# Patient Record
Sex: Female | Born: 1960 | Race: White | Hispanic: No | Smoking: Never smoker
Health system: Southern US, Community
[De-identification: ages and names within clinical notes are randomized; demographics above are authoritative.]

## PROBLEM LIST (undated history)

## (undated) DIAGNOSIS — Z8719 Personal history of other diseases of the digestive system: Secondary | ICD-10-CM

## (undated) DIAGNOSIS — C801 Malignant (primary) neoplasm, unspecified: Secondary | ICD-10-CM

## (undated) DIAGNOSIS — G629 Polyneuropathy, unspecified: Secondary | ICD-10-CM

## (undated) DIAGNOSIS — F32A Depression, unspecified: Secondary | ICD-10-CM

## (undated) DIAGNOSIS — M2550 Pain in unspecified joint: Secondary | ICD-10-CM

## (undated) DIAGNOSIS — A64 Unspecified sexually transmitted disease: Secondary | ICD-10-CM

## (undated) DIAGNOSIS — F419 Anxiety disorder, unspecified: Secondary | ICD-10-CM

## (undated) DIAGNOSIS — Z973 Presence of spectacles and contact lenses: Secondary | ICD-10-CM

## (undated) DIAGNOSIS — M7989 Other specified soft tissue disorders: Secondary | ICD-10-CM

## (undated) DIAGNOSIS — G35 Multiple sclerosis: Secondary | ICD-10-CM

## (undated) DIAGNOSIS — H539 Unspecified visual disturbance: Secondary | ICD-10-CM

## (undated) DIAGNOSIS — F329 Major depressive disorder, single episode, unspecified: Secondary | ICD-10-CM

## (undated) DIAGNOSIS — S2239XA Fracture of one rib, unspecified side, initial encounter for closed fracture: Secondary | ICD-10-CM

## (undated) DIAGNOSIS — Z923 Personal history of irradiation: Secondary | ICD-10-CM

## (undated) DIAGNOSIS — M1990 Unspecified osteoarthritis, unspecified site: Secondary | ICD-10-CM

## (undated) DIAGNOSIS — R413 Other amnesia: Secondary | ICD-10-CM

## (undated) DIAGNOSIS — Z87442 Personal history of urinary calculi: Secondary | ICD-10-CM

## (undated) DIAGNOSIS — K760 Fatty (change of) liver, not elsewhere classified: Secondary | ICD-10-CM

## (undated) DIAGNOSIS — D649 Anemia, unspecified: Secondary | ICD-10-CM

## (undated) DIAGNOSIS — M549 Dorsalgia, unspecified: Secondary | ICD-10-CM

## (undated) DIAGNOSIS — B029 Zoster without complications: Secondary | ICD-10-CM

## (undated) DIAGNOSIS — R3915 Urgency of urination: Secondary | ICD-10-CM

## (undated) HISTORY — PX: BUNIONECTOMY: SHX129

## (undated) HISTORY — DX: Depression, unspecified: F32.A

## (undated) HISTORY — DX: Polyneuropathy, unspecified: G62.9

## (undated) HISTORY — DX: Pain in unspecified joint: M25.50

## (undated) HISTORY — DX: Other amnesia: R41.3

## (undated) HISTORY — DX: Zoster without complications: B02.9

## (undated) HISTORY — DX: Other specified soft tissue disorders: M79.89

## (undated) HISTORY — DX: Unspecified visual disturbance: H53.9

## (undated) HISTORY — PX: COLONOSCOPY W/ BIOPSIES AND POLYPECTOMY: SHX1376

## (undated) HISTORY — DX: Anxiety disorder, unspecified: F41.9

## (undated) HISTORY — PX: ADENOIDECTOMY: SUR15

## (undated) HISTORY — DX: Fatty (change of) liver, not elsewhere classified: K76.0

## (undated) HISTORY — DX: Multiple sclerosis: G35

## (undated) HISTORY — DX: Fracture of one rib, unspecified side, initial encounter for closed fracture: S22.39XA

## (undated) HISTORY — PX: TONSILLECTOMY: SUR1361

## (undated) HISTORY — DX: Major depressive disorder, single episode, unspecified: F32.9

## (undated) HISTORY — DX: Dorsalgia, unspecified: M54.9

## (undated) HISTORY — DX: Unspecified sexually transmitted disease: A64

---

## 2001-09-18 ENCOUNTER — Emergency Department (HOSPITAL_COMMUNITY): Admission: EM | Admit: 2001-09-18 | Discharge: 2001-09-18 | Payer: Self-pay | Admitting: *Deleted

## 2003-03-29 ENCOUNTER — Encounter: Admission: RE | Admit: 2003-03-29 | Discharge: 2003-03-29 | Payer: Self-pay | Admitting: Internal Medicine

## 2003-05-09 ENCOUNTER — Encounter: Admission: RE | Admit: 2003-05-09 | Discharge: 2003-05-09 | Payer: Self-pay | Admitting: Internal Medicine

## 2005-08-30 ENCOUNTER — Emergency Department (HOSPITAL_COMMUNITY): Admission: EM | Admit: 2005-08-30 | Discharge: 2005-08-30 | Payer: Self-pay | Admitting: Emergency Medicine

## 2008-03-25 ENCOUNTER — Other Ambulatory Visit: Admission: RE | Admit: 2008-03-25 | Discharge: 2008-03-25 | Payer: Self-pay | Admitting: Obstetrics and Gynecology

## 2009-02-08 HISTORY — PX: BUNIONECTOMY: SHX129

## 2009-11-03 ENCOUNTER — Ambulatory Visit: Payer: Self-pay | Admitting: Internal Medicine

## 2011-12-14 DIAGNOSIS — M1990 Unspecified osteoarthritis, unspecified site: Secondary | ICD-10-CM | POA: Insufficient documentation

## 2012-03-20 DIAGNOSIS — G35 Multiple sclerosis: Secondary | ICD-10-CM | POA: Insufficient documentation

## 2012-09-19 DIAGNOSIS — F32A Depression, unspecified: Secondary | ICD-10-CM | POA: Insufficient documentation

## 2012-09-19 DIAGNOSIS — F329 Major depressive disorder, single episode, unspecified: Secondary | ICD-10-CM | POA: Insufficient documentation

## 2012-10-16 ENCOUNTER — Encounter: Payer: Self-pay | Admitting: Certified Nurse Midwife

## 2012-10-17 ENCOUNTER — Ambulatory Visit (INDEPENDENT_AMBULATORY_CARE_PROVIDER_SITE_OTHER): Payer: BC Managed Care – PPO | Admitting: Certified Nurse Midwife

## 2012-10-17 ENCOUNTER — Encounter: Payer: Self-pay | Admitting: Certified Nurse Midwife

## 2012-10-17 VITALS — BP 130/70 | HR 60 | Resp 16 | Ht 63.75 in | Wt 223.0 lb

## 2012-10-17 DIAGNOSIS — Z862 Personal history of diseases of the blood and blood-forming organs and certain disorders involving the immune mechanism: Secondary | ICD-10-CM

## 2012-10-17 DIAGNOSIS — Z0000 Encounter for general adult medical examination without abnormal findings: Secondary | ICD-10-CM

## 2012-10-17 DIAGNOSIS — R6889 Other general symptoms and signs: Secondary | ICD-10-CM

## 2012-10-17 DIAGNOSIS — A6009 Herpesviral infection of other urogenital tract: Secondary | ICD-10-CM

## 2012-10-17 DIAGNOSIS — Z01419 Encounter for gynecological examination (general) (routine) without abnormal findings: Secondary | ICD-10-CM

## 2012-10-17 DIAGNOSIS — A6000 Herpesviral infection of urogenital system, unspecified: Secondary | ICD-10-CM

## 2012-10-17 LAB — IBC PANEL
%SAT: 6 % — ABNORMAL LOW (ref 20–55)
UIBC: 414 ug/dL — ABNORMAL HIGH (ref 125–400)

## 2012-10-17 LAB — POCT URINALYSIS DIPSTICK
Blood, UA: NEGATIVE
Nitrite, UA: NEGATIVE
Urobilinogen, UA: NEGATIVE

## 2012-10-17 LAB — COMPREHENSIVE METABOLIC PANEL
ALT: 24 U/L (ref 0–35)
AST: 25 U/L (ref 0–37)
BUN: 17 mg/dL (ref 6–23)
Calcium: 9 mg/dL (ref 8.4–10.5)
Chloride: 106 mEq/L (ref 96–112)
Creat: 0.73 mg/dL (ref 0.50–1.10)
Potassium: 4.2 mEq/L (ref 3.5–5.3)
Sodium: 137 mEq/L (ref 135–145)
Total Bilirubin: 0.6 mg/dL (ref 0.3–1.2)
Total Protein: 6.7 g/dL (ref 6.0–8.3)

## 2012-10-17 LAB — LIPID PANEL
Cholesterol: 161 mg/dL (ref 0–200)
LDL Cholesterol: 66 mg/dL (ref 0–99)
Total CHOL/HDL Ratio: 3.4 Ratio
Triglycerides: 236 mg/dL — ABNORMAL HIGH (ref <150)

## 2012-10-17 LAB — CBC
HCT: 31.9 % — ABNORMAL LOW (ref 36.0–46.0)
MCH: 23.1 pg — ABNORMAL LOW (ref 26.0–34.0)

## 2012-10-17 LAB — HEMOGLOBIN, FINGERSTICK: Hemoglobin, fingerstick: 9.8 g/dL — ABNORMAL LOW (ref 12.0–16.0)

## 2012-10-17 LAB — FERRITIN: Ferritin: 4 ng/mL — ABNORMAL LOW (ref 10–291)

## 2012-10-17 MED ORDER — VALACYCLOVIR HCL 1 G PO TABS: 500.0000 mg | ORAL_TABLET | Freq: Every day | ORAL | Status: DC

## 2012-10-17 MED ORDER — FUSION PLUS PO CAPS: 1.0000 | ORAL_CAPSULE | Freq: Every day | ORAL | Status: DC

## 2012-10-17 NOTE — Patient Instructions (Addendum)

## 2012-10-17 NOTE — Progress Notes (Signed)
52 y.o. G53P1001 Divorced Caucasian Fe here for annual exam. Periods regular, no issues. MS follow up now yearly, with Lexapro use now. Fatigue still an issue, but works full time. No STD screening needed. Has follow up soon regarding Lexapro. No partner change. No health issues today.  Has stopped iron po, due to constipation. Patient tries to work iron in diet.  Patient's last menstrual period was 09/24/2012.          Sexually active: yes  The current method of family planning is condoms all the time.   Partner vasectomy. Exercising: no  exercise Smoker:  no  Health Maintenance: Pap:  05-14-11 neg HPV HR neg MMG:  09/16/12 Colonoscopy:  2011 BMD:   none TDaP:  2012 Labs: Poct urine-neg, hgb-9.8 Self breast exam: done monthly   reports that she has never smoked. She does not have any smokeless tobacco history on file. She reports that  drinks alcohol. She reports that she does not use illicit drugs.  Past Medical History  Diagnosis Date  . Multiple sclerosis     age 9  . 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 Kidney stone   . Shingles     Past Surgical History  Procedure Laterality Date  . Cesarean section      Current Outpatient Prescriptions  Medication Sig Dispense Refill  . CALCIUM PO Take by mouth daily.      . Cholecalciferol (VITAMIN D PO) Take by mouth daily.      Marland Kitchen escitalopram (LEXAPRO) 10 MG tablet Take 10 mg by mouth daily.      . IRON PO Take by mouth. occ      . valACYclovir (VALTREX) 1000 MG tablet Take 500 mg by mouth daily.       No current facility-administered medications for this visit.    Family History  Problem Relation Age of Onset  . Osteoporosis Mother   . Heart disease Mother   . Cancer Father     colon  . Heart disease Brother     ROS:  Pertinent items are noted in HPI.  Otherwise, a comprehensive ROS was negative.  Exam:   BP 130/70  Pulse 60  Resp 16  Ht 5' 3.75" (1.619 m)  Wt 223 lb (101.152 kg)  BMI 38.59 kg/m2   LMP 09/24/2012 Height: 5' 3.75" (161.9 cm)  Ht Readings from Last 3 Encounters:  10/17/12 5' 3.75" (1.619 m)    General appearance: alert, cooperative and appears stated age Head: Normocephalic, without obvious abnormality, atraumatic Neck: no adenopathy, supple, symmetrical, trachea midline and thyroid normal to inspection and palpation Lungs: clear to auscultation bilaterally Breasts: normal appearance, no masses or tenderness, No nipple retraction or dimpling, No nipple discharge or bleeding, No axillary or supraclavicular adenopathy Heart: regular rate and rhythm Abdomen: soft, non-tender; no masses,  no organomegaly Extremities: extremities normal, atraumatic, no cyanosis or edema Skin: Skin color, texture, turgor normal. No rashes or lesions Lymph nodes: Cervical, supraclavicular, and axillary nodes normal. No abnormal inguinal nodes palpated Neurologic: Grossly normal   Pelvic: External genitalia:  no lesions              Urethra:  normal appearing urethra with no masses, tenderness or lesions              Bartholin's and Skene's: normal                 Vagina: normal appearing vagina with normal color and  discharge, no lesions              Cervix: normal, non tender              Pap taken: no Bimanual Exam:  Uterus:  normal size, contour, position, consistency, mobility, non-tender and anteverted              Adnexa: normal adnexa and no mass, fullness, tenderness               Rectovaginal: Confirms               Anus:  normal sphincter tone, no lesions  A:  Well Woman with normal exam  Perimenopausal  History of MS diagnosed in 30's, stable, has follow up every year with neurology  History of anemia with low Hgb.today, stopped oral iron due to constipation  P:   Reviewed health and wellness pertinent to exam  Patient has menses calendar with normal and abnormal parameters  Keep follow up as recommended  Discussed importance of maintaining good iron count to help with  fatigue, and decrease risk of infection.  Discussed Rx Fusion Plus which has a probiotic that helps eliminate constipation. Discussed dietary absorption factors and iron food sources. Avoid large amounts of tea, soda. Questions addressed at length.  Patient desires trial  RX Fusion Plus see order  Lab: Ferritin, Iron,TIBC panel, CBC, Lipid panel, CMP, TSH,Hgb A-1c  Pap smear as per guidelines   Mammogram yearly Pap smear not taken today   adequate intake of calcium and vitamin D, diet and exercise, Kegel's exercises discussed  return annually or prn  An After Visit Summary was printed and given to the patient.

## 2012-10-19 ENCOUNTER — Telehealth: Payer: Self-pay

## 2012-10-19 NOTE — Telephone Encounter (Signed)
Message copied by Eliezer Bottom on Thu Oct 19, 2012 10:03 AM ------      Message from: Verner Chol      Created: Wed Oct 18, 2012  9:05 AM       Notify patient that CBC did show low Hgb.      Iron level 26 should be between 42-145      Ferritin level low 4 should be 10 to 291      Iron saturation level low  6 should be 20 to 55%      All  Indicate iron deficiency anemia, Start on Fusion Plus as discussed at bedtime, work on iron foods and good vitamin c intake for absorption      Re check Iron level in one month  (order in)      Notify Hgb A1-c , liver kidney glucose profile, thyroid, normal      Lipid panel normal except elevated triglycerides, work on decreasing fried foods, donuts, fried meats etc. Increase exercise daily recheck in 6 months ( order in) fasting ------

## 2012-10-19 NOTE — Telephone Encounter (Signed)
Patient returning call.

## 2012-10-19 NOTE — Telephone Encounter (Signed)
lmtcb

## 2012-10-19 NOTE — Telephone Encounter (Signed)
Left message for call back.

## 2012-10-19 NOTE — Telephone Encounter (Signed)
Patient calling Joy back again.

## 2012-10-19 NOTE — Telephone Encounter (Signed)
Patient notified

## 2012-10-22 NOTE — Progress Notes (Signed)
Note reviewed, agree with plan.  Aarionna Germer, MD  

## 2013-08-30 ENCOUNTER — Emergency Department (INDEPENDENT_AMBULATORY_CARE_PROVIDER_SITE_OTHER)
Admission: EM | Admit: 2013-08-30 | Discharge: 2013-08-30 | Disposition: A | Discharge status: Home / Self Care | Payer: BC Managed Care – PPO | Source: Home / Self Care | Attending: Family Medicine | Admitting: Family Medicine

## 2013-08-30 ENCOUNTER — Emergency Department (INDEPENDENT_AMBULATORY_CARE_PROVIDER_SITE_OTHER): Payer: BC Managed Care – PPO

## 2013-08-30 ENCOUNTER — Encounter (HOSPITAL_COMMUNITY): Payer: Self-pay | Admitting: Emergency Medicine

## 2013-08-30 DIAGNOSIS — M25561 Pain in right knee: Secondary | ICD-10-CM

## 2013-08-30 DIAGNOSIS — M25569 Pain in unspecified knee: Secondary | ICD-10-CM

## 2013-08-30 MED ORDER — MELOXICAM 7.5 MG PO TABS
7.5000 mg | ORAL_TABLET | Freq: Every day | ORAL | Status: DC
Start: 2013-08-30 — End: 2014-02-27

## 2013-08-30 NOTE — ED Provider Notes (Signed)
CSN: [id number]     Arrival date & time 08/30/13  1541 History   First MD Initiated Contact with Patient 08/30/13 1558     Chief Complaint  Patient presents with  . Knee Pain   (Consider location/radiation/quality/duration/timing/severity/associated sxs/prior Treatment) Patient is a 53 y.o. female presenting with knee pain. The history is provided by the patient.  Knee Pain Location:  Knee Time since incident:  4 months Injury: yes   Mechanism of injury: fall   Mechanism of injury comment:  States she fell on concrete landing on both knees 4 months ago and still has persistent discomfort at right knee Fall:    Fall occurred:  Standing   Entrapped after fall: no   Pain details:    Quality:  Aching Chronicity:  Chronic Dislocation: no   Prior injury to area:  No Ineffective treatments:  None tried   Past Medical History  Diagnosis Date  . Multiple sclerosis     age 89  . 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 Kidney stone   . Shingles    Past Surgical History  Procedure Laterality Date  . Cesarean section     Family History  Problem Relation Age of Onset  . Osteoporosis Mother   . Heart disease Mother   . Cancer Father     colon  . Heart disease Brother    History  Substance Use Topics  . Smoking status: Never Smoker   . Smokeless tobacco: Not on file  . Alcohol Use: Yes     Comment: rare   OB History   Grav Para Term Preterm Abortions TAB SAB Ect Mult Living   1 1 1       1      Review of Systems  Allergies  Review of patient's allergies indicates no known allergies.  Home Medications   Prior to Admission medications   Medication Sig Start Date End Date Taking? Authorizing Provider  CALCIUM PO Take by mouth daily.    Historical Provider, MD  Cholecalciferol (VITAMIN D PO) Take by mouth daily.    Historical Provider, MD  escitalopram (LEXAPRO) 10 MG tablet Take 10 mg by mouth daily.    Historical Provider, MD  IRON PO Take by mouth.  occ    Historical Provider, MD  Iron-FA-B Cmp-C-Biot-Probiotic (FUSION PLUS) CAPS Take 1 capsule by mouth at bedtime. 10/17/12   Regina Eck, CNM  meloxicam (MOBIC) 7.5 MG tablet Take 1 tablet (7.5 mg total) by mouth daily. 08/30/13   Lahoma Rocker, PA  valACYclovir (VALTREX) 1000 MG tablet Take 0.5 tablets (500 mg total) by mouth daily. 10/17/12   Regina Eck, CNM   BP 172/89  Pulse 100  Temp(Src) 97.8 F (36.6 C) (Oral)  Resp 14  SpO2 100%  LMP 08/04/2013 Physical Exam  Nursing note and vitals reviewed. Constitutional: She is oriented to person, place, and time. She appears well-developed and well-nourished. No distress.  HENT:  Head: Normocephalic and atraumatic.  Eyes: Conjunctivae are normal. No scleral icterus.  Cardiovascular: Normal rate.   Pulmonary/Chest: Effort normal.  Musculoskeletal: Normal range of motion.       Right knee: She exhibits normal range of motion, no swelling, no effusion, no ecchymosis, no deformity, no laceration, no erythema, normal alignment, no LCL laxity, normal patellar mobility, no bony tenderness, normal meniscus and no MCL laxity. Tenderness found. Lateral joint line tenderness noted. No medial joint line and no patellar tendon tenderness noted.  Neurological: She is alert and oriented to person, place, and time.  Skin: Skin is warm and dry. No rash noted. No erythema.  Psychiatric: She has a normal mood and affect. Her behavior is normal.    ED Course  Procedures (including critical care time) Labs Review Labs Reviewed - No data to display  Imaging Review Dg Knee Complete 4 Views Right  08/30/2013   CLINICAL DATA:  Fall, right knee pain  EXAM: RIGHT KNEE - COMPLETE 4+ VIEW  COMPARISON:  None.  FINDINGS: Four views of the right knee submitted. No acute fracture or subluxation. Minimal narrowing of medial joint compartment. There is narrowing of patellofemoral joint space. No joint effusion. Spurring of patella.  IMPRESSION: No  acute fracture or subluxation. Mild degenerative changes as described above.   Electronically Signed   By: Lahoma Crocker M.D.   On: 08/30/2013 16:36     MDM   1. Right knee pain    Films of right knee without notable abnormality. Will advise using Mobic as prescribed and referred to Dr. Berenice Primas (Orthopedist) for follow up if no improvement.   [place], [place] 08/30/13 [phone]

## 2013-08-30 NOTE — Discharge Instructions (Signed)
Your xrays were without evidence of injury to your bones. You amy use Mobic as prescribed for pain and if this does not bring you adequate relief, please contact the orthopedist (Dr. Berenice Primas) listed on your discharge paperwork for re-evaluation.  Arthralgia Your caregiver has diagnosed you as suffering from an arthralgia. Arthralgia means there is pain in a joint. This can come from many reasons including:  Bruising the joint which causes soreness (inflammation) in the joint.  Wear and tear on the joints which occur as we grow older (osteoarthritis).  Overusing the joint.  Various forms of arthritis.  Infections of the joint. Regardless of the cause of pain in your joint, most of these different pains respond to anti-inflammatory drugs and rest. The exception to this is when a joint is infected, and these cases are treated with antibiotics, if it is a bacterial infection. HOME CARE INSTRUCTIONS   Rest the injured area for as long as directed by your caregiver. Then slowly start using the joint as directed by your caregiver and as the pain allows. Crutches as directed may be useful if the ankles, knees or hips are involved. If the knee was splinted or casted, continue use and care as directed. If an stretchy or elastic wrapping bandage has been applied today, it should be removed and re-applied every 3 to 4 hours. It should not be applied tightly, but firmly enough to keep swelling down. Watch toes and feet for swelling, bluish discoloration, coldness, numbness or excessive pain. If any of these problems (symptoms) occur, remove the ace bandage and re-apply more loosely. If these symptoms persist, contact your caregiver or return to this location.  For the first 24 hours, keep the injured extremity elevated on pillows while lying down.  Apply ice for 15-20 minutes to the sore joint every couple hours while awake for the first half day. Then 03-04 times per day for the first 48 hours. Put the ice in  a plastic bag and place a towel between the bag of ice and your skin.  Wear any splinting, casting, elastic bandage applications, or slings as instructed.  Only take over-the-counter or prescription medicines for pain, discomfort, or fever as directed by your caregiver. Do not use aspirin immediately after the injury unless instructed by your physician. Aspirin can cause increased bleeding and bruising of the tissues.  If you were given crutches, continue to use them as instructed and do not resume weight bearing on the sore joint until instructed. Persistent pain and inability to use the sore joint as directed for more than 2 to 3 days are warning signs indicating that you should see a caregiver for a follow-up visit as soon as possible. Initially, a hairline fracture (break in bone) may not be evident on X-rays. Persistent pain and swelling indicate that further evaluation, non-weight bearing or use of the joint (use of crutches or slings as instructed), or further X-rays are indicated. X-rays may sometimes not show a small fracture until a week or 10 days later. Make a follow-up appointment with your own caregiver or one to whom we have referred you. A radiologist (specialist in reading X-rays) may read your X-rays. Make sure you know how you are to obtain your X-ray results. Do not assume everything is normal if you do not hear from [place]. SEEK MEDICAL CARE IF: Bruising, swelling, or pain increases. SEEK IMMEDIATE MEDICAL CARE IF:   Your fingers or toes are numb or blue.  The pain is not responding to medications  and continues to stay the same or get worse.  The pain in your joint becomes severe.  You develop a fever over 102 F (38.9 C).  It becomes impossible to move or use the joint. MAKE SURE YOU:   Understand these instructions.  Will watch your condition.  Will get help right away if you are not doing well or get worse. Document Released: 01/25/2005 Document Revised: 04/19/2011  Document Reviewed: 09/13/2007 [facility] Patient Information 2015 [place], [place]. This information is not intended to replace advice given to you by your health care provider. Make sure you discuss any questions you have with your health care provider.  Knee Pain The knee is the complex joint between your thigh and your lower leg. It is made up of bones, tendons, ligaments, and cartilage. The bones that make up the knee are:  The femur in the thigh.  The tibia and fibula in the lower leg.  The patella or kneecap riding in the groove on the lower femur. CAUSES  Knee pain is a common complaint with many causes. A few of these causes are:  Injury, such as:  A ruptured ligament or tendon injury.  Torn cartilage.  Medical conditions, such as:  Gout  Arthritis  Infections  Overuse, over training, or overdoing a physical activity. Knee pain can be minor or severe. Knee pain can accompany debilitating injury. Minor knee problems often respond well to self-care measures or get well on their own. More serious injuries may need medical intervention or even surgery. SYMPTOMS The knee is complex. Symptoms of knee problems can vary widely. Some of the problems are:  Pain with movement and weight bearing.  Swelling and tenderness.  Buckling of the knee.  Inability to straighten or extend your knee.  Your knee locks and you cannot straighten it.  Warmth and redness with pain and fever.  Deformity or dislocation of the kneecap. DIAGNOSIS  Determining what is wrong may be very straight forward such as when there is an injury. It can also be challenging because of the complexity of the knee. Tests to make a diagnosis may include:  Your caregiver taking a history and doing a physical exam.  Routine X-rays can be used to rule out other problems. X-rays will not reveal a cartilage tear. Some injuries of the knee can be diagnosed by:  Arthroscopy a surgical technique by which a small  video camera is inserted through tiny incisions on the sides of the knee. This procedure is used to examine and repair internal knee joint problems. Tiny instruments can be used during arthroscopy to repair the torn knee cartilage (meniscus).  Arthrography is a radiology technique. A contrast liquid is directly injected into the knee joint. Internal structures of the knee joint then become visible on X-ray film.  An MRI scan is a non X-ray radiology procedure in which magnetic fields and a computer produce two- or three-dimensional images of the inside of the knee. Cartilage tears are often visible using an MRI scanner. MRI scans have largely replaced arthrography in diagnosing cartilage tears of the knee.  Blood work.  Examination of the fluid that helps to lubricate the knee joint (synovial fluid). This is done by taking a sample out using a needle and a syringe. TREATMENT The treatment of knee problems depends on the cause. Some of these treatments are:  Depending on the injury, proper casting, splinting, surgery, or physical therapy care will be needed.  Give yourself adequate recovery time. Do not overuse your  joints. If you begin to get sore during workout routines, back off. Slow down or do fewer repetitions.  For repetitive activities such as cycling or running, maintain your strength and nutrition.  Alternate muscle groups. For example, if you are a weight lifter, work the upper body on one day and the lower body the next.  Either tight or weak muscles do not give the proper support for your knee. Tight or weak muscles do not absorb the stress placed on the knee joint. Keep the muscles surrounding the knee strong.  Take care of mechanical problems.  If you have flat feet, orthotics or special shoes may help. See your caregiver if you need help.  Arch supports, sometimes with wedges on the inner or outer aspect of the heel, can help. These can shift pressure away from the side of  the knee most bothered by osteoarthritis.  A brace called an "unloader" brace also may be used to help ease the pressure on the most arthritic side of the knee.  If your caregiver has prescribed crutches, braces, wraps or ice, use as directed. The acronym for this is PRICE. This means protection, rest, ice, compression, and elevation.  Nonsteroidal anti-inflammatory drugs (NSAIDs), can help relieve pain. But if taken immediately after an injury, they may actually increase swelling. Take NSAIDs with food in your stomach. Stop them if you develop stomach problems. Do not take these if you have a history of ulcers, stomach pain, or bleeding from the bowel. Do not take without your caregiver's approval if you have problems with fluid retention, heart failure, or kidney problems.  For ongoing knee problems, physical therapy may be helpful.  Glucosamine and chondroitin are over-the-counter dietary supplements. Both may help relieve the pain of osteoarthritis in the knee. These medicines are different from the usual anti-inflammatory drugs. Glucosamine may decrease the rate of cartilage destruction.  Injections of a corticosteroid drug into your knee joint may help reduce the symptoms of an arthritis flare-up. They may provide pain relief that lasts a few months. You may have to wait a few months between injections. The injections do have a small increased risk of infection, water retention, and elevated blood sugar levels.  Hyaluronic acid injected into damaged joints may ease pain and provide lubrication. These injections may work by reducing inflammation. A series of shots may give relief for as long as 6 months.  Topical painkillers. Applying certain ointments to your skin may help relieve the pain and stiffness of osteoarthritis. Ask your pharmacist for suggestions. Many over the-counter products are approved for temporary relief of arthritis pain.  In some countries, doctors often prescribe topical  NSAIDs for relief of chronic conditions such as arthritis and tendinitis. A review of treatment with NSAID creams found that they worked as well as oral medications but without the serious side effects. PREVENTION  Maintain a healthy weight. Extra pounds put more strain on your joints.  Get strong, stay limber. Weak muscles are a common cause of knee injuries. Stretching is important. Include flexibility exercises in your workouts.  Be smart about exercise. If you have osteoarthritis, chronic knee pain or recurring injuries, you may need to change the way you exercise. This does not mean you have to stop being active. If your knees ache after jogging or playing basketball, consider switching to swimming, water aerobics, or other low-impact activities, at least for a few days a week. Sometimes limiting high-impact activities will provide relief.  Make sure your shoes fit well. Choose  footwear that is right for your sport.  Protect your knees. Use the proper gear for knee-sensitive activities. Use kneepads when playing volleyball or laying carpet. Buckle your seat belt every time you drive. Most shattered kneecaps occur in car accidents.  Rest when you are tired. SEEK MEDICAL CARE IF:  You have knee pain that is continual and does not seem to be getting better.  SEEK IMMEDIATE MEDICAL CARE IF:  Your knee joint feels hot to the touch and you have a high fever. MAKE SURE YOU:   Understand these instructions.  Will watch your condition.  Will get help right away if you are not doing well or get worse. Document Released: 11/22/2006 Document Revised: 04/19/2011 Document Reviewed: 11/22/2006 [facility] Patient Information 2015 [place], [place]. This information is not intended to replace advice given to you by your health care provider. Make sure you discuss any questions you have with your health care provider.

## 2013-08-30 NOTE — ED Notes (Signed)
C/o bilateral knee pain  Stated right knee is worst Has been using knee brace Prescribed meds taking for pain

## 2013-08-30 NOTE — ED Provider Notes (Signed)
Medical screening examination/treatment/procedure(s) were performed by a resident physician or non-physician practitioner and as the supervising physician I was immediately available for consultation/collaboration.  Linna Darner, MD Family Medicine   Waldemar Dickens, MD 08/30/13 2133

## 2013-12-10 ENCOUNTER — Encounter (HOSPITAL_COMMUNITY): Payer: Self-pay | Admitting: Emergency Medicine

## 2014-02-08 DIAGNOSIS — B029 Zoster without complications: Secondary | ICD-10-CM

## 2014-02-08 HISTORY — DX: Zoster without complications: B02.9

## 2014-02-27 ENCOUNTER — Ambulatory Visit (INDEPENDENT_AMBULATORY_CARE_PROVIDER_SITE_OTHER): Payer: BC Managed Care – PPO | Admitting: Neurology

## 2014-02-27 ENCOUNTER — Encounter: Payer: Self-pay | Admitting: Neurology

## 2014-02-27 VITALS — BP 144/88 | HR 74 | Resp 16 | Wt 232.4 lb

## 2014-02-27 DIAGNOSIS — R260 Ataxic gait: Secondary | ICD-10-CM

## 2014-02-27 DIAGNOSIS — R5382 Chronic fatigue, unspecified: Secondary | ICD-10-CM

## 2014-02-27 DIAGNOSIS — H469 Unspecified optic neuritis: Secondary | ICD-10-CM

## 2014-02-27 DIAGNOSIS — G35 Multiple sclerosis: Secondary | ICD-10-CM

## 2014-02-27 MED ORDER — MELOXICAM 7.5 MG PO TABS: 7.5000 mg | ORAL_TABLET | Freq: Every day | ORAL | Status: DC

## 2014-02-27 MED ORDER — AMPHETAMINE-DEXTROAMPHET ER 20 MG PO CP24: 20.0000 mg | ORAL_CAPSULE | Freq: Every day | ORAL | Status: DC

## 2014-02-27 NOTE — Progress Notes (Signed)
[facility]  PATIENT: Raven Montoya DOB: December 28, 1960  REFERRING CLINICIAN: Anastasia Pall HISTORY FROM: Patient REASON FOR VISIT: MS   HISTORICAL  CHIEF COMPLAINT:  Chief Complaint  Patient presents with  . Multiple Sclerosis    Sts. balance may be some worse./fim    HISTORY OF PRESENT ILLNESS:  Raven Montoya is a 54 year old woman who was diagnosed with multiple sclerosis at age 53 after presenting with optic neuritis in the left eye. Her neurologist discussed with her that she probably had MS but no medication was started at that time. Vision got better over the next few weeks. A couple years later, she went to see Dr. Effie Shy. He felt that the MS should be treated and she was started on Rebif. She remain on Rebif for many years I saw her a few years ago and and we switched her to Gilenya as she would prefer an oral agent over a shot. Her MS has done fairly well and she has not had any major exacerbations while on Rebif or on Gilenya. She tolerates Gilenya well.  Her main symptoms at this point are fatigue and some tingling in the hands and feet does not note any difficulty with her bladder and currently does not have any problems with her vision.  The tingling that she gets is mild. She does not have any at baseline but will get occasional tingling into the hands or feet. This is more likely to happen if she has been driving for long time. As that is constant. There is no painful dysesthesias. There is no weakness in the arms or legs. She fell twice in a row couple weeks ago on an uneven surface, first landing on her left hip then landing on her right hip her right hip is still painful.  Her fatigue is more mentally than physical. 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    Recently we started Adderall X or 15 mg daily and she finds a benefit from that medicine. To focus better and feels cognition does not fatigue as it used to.  She  denies any significant depression or anxiety but does feel down at times. Her mother lives with her and she is a very depressive person.  REVIEW OF SYSTEMS:  Constitutional: No fevers, chills, sweats, or change in appetite Eyes: No visual changes, double vision, eye pain Ear, nose and throat: No hearing loss, ear pain, nasal congestion, sore throat Cardiovascular: No chest pain, palpitations Respiratory:  No shortness of breath at rest or with exertion.   No wheezes GastrointestinaI: No nausea, vomiting, diarrhea, abdominal pain, fecal incontinence Genitourinary:  No dysuria, urinary retention or frequency.  No nocturia. Musculoskeletal:  No neck pain, back pain Integumentary: No rash, pruritus, skin lesions Neurological: as above Psychiatric: No depression at this time.  No anxiety Endocrine: No palpitations, diaphoresis, change in appetite, change in weigh or increased thirst Hematologic/Lymphatic:  No anemia, purpura, petechiae. Allergic/Immunologic: No itchy/runny eyes, nasal congestion, recent allergic reactions, rashes  ALLERGIES: No Known Allergies  HOME MEDICATIONS: Outpatient Prescriptions Prior to Visit  Medication Sig Dispense Refill  . IRON PO Take by mouth. occ    . Iron-FA-B Cmp-C-Biot-Probiotic (FUSION PLUS) CAPS Take 1 capsule by mouth at bedtime. 30 capsule 6  . meloxicam (MOBIC) 7.5 MG tablet Take 1 tablet (7.5 mg total) by mouth daily. 20 tablet 0  . valACYclovir (VALTREX) 1000 MG tablet Take 0.5 tablets (500 mg total) by mouth daily.  30 tablet 12  . CALCIUM PO Take by mouth daily.    . Cholecalciferol (VITAMIN D PO) Take by mouth daily.    Marland Kitchen escitalopram (LEXAPRO) 10 MG tablet Take 10 mg by mouth daily.     No facility-administered medications prior to visit.    PAST MEDICAL HISTORY: Past Medical History  Diagnosis Date  . Multiple sclerosis     age 89  . 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 Shingles   . Memory loss   . Neuropathy   .  Vision abnormalities     PAST SURGICAL HISTORY: Past Surgical History  Procedure Laterality Date  . Cesarean section      FAMILY HISTORY: Family History  Problem Relation Age of Onset  . Osteoporosis Mother   . Heart disease Mother   . Cancer Father     colon  . Heart disease Brother     SOCIAL HISTORY:  History   Social History  . Marital Status: Divorced    Spouse Name: N/A    Number of Children: N/A  . Years of Education: N/A   Occupational History  . Not on file.   Social History Main Topics  . Smoking status: Never Smoker   . Smokeless tobacco: Not on file  . Alcohol Use: Yes     Comment: rare  . Drug Use: No  . Sexual Activity:    Partners: Male    Birth Control/ Protection: Condom   Other Topics Concern  . Not on file   Social History Narrative     PHYSICAL EXAM  Filed Vitals:   02/27/14 1133  BP: 144/88  Pulse: 74  Resp: 16  Weight: 232 lb 6.4 oz (105.416 kg)    Body mass index is 40.22 kg/(m^2).   General: The patient is well-developed and well-nourished and in no acute distress  Eyes:  Funduscopic exam shows normal optic discs and retinal vessels.  Neck: The neck is supple, no carotid bruits are noted.  The neck is nontender.  Respiratory: The respiratory examination is clear.  Cardiovascular: The cardiovascular examination reveals a regular rate and rhythm, no murmurs, gallops or rubs are noted.  Skin: Extremities are without significant edema.  Neurologic Exam  Mental status: The patient is alert and oriented x 3 at the time of the examination. The patient has apparent normal recent and remote memory, with an apparently normal attention span and concentration ability.   Speech is normal.  Cranial nerves: Extraocular movements are full. Pupils are equal, round, and reactive to light and accomodation.  Visual fields are full.  Facial symmetry is present. There is good facial sensation to soft touch bilaterally.Facial strength is  normal.  Trapezius and sternocleidomastoid strength is normal. No dysarthria is noted.  The tongue is midline, and the patient has symmetric elevation of the soft palate. No obvious hearing deficits are noted.  Motor:  Muscle bulk and tone are normal. Strength is  5 / 5 in all 4 extremities.   Sensory: Sensory testing is intact to pinprick, soft touch, vibration sensation, and position sense on all 4 extremities.  Coordination: Cerebellar testing reveals good finger-nose-finger and heel-to-shin bilaterally.  Gait and station: Station and gait are normal. Tandem gait is mildly wide. Romberg is negative.   Reflexes: Deep tendon reflexes are symmetric and brisk bilaterally. Plantar responses are normal.    DIAGNOSTIC DATA (LABS, IMAGING, TESTING) - I reviewed patient records, labs, notes, testing and imaging myself where  available.  Lab Results  Component Value Date   WBC 7.0 10/17/2012   HGB 9.8* 10/17/2012   HCT 31.9* 10/17/2012   MCV 75.1* 10/17/2012   PLT 302 10/17/2012      Component Value Date/Time   NA 137 10/17/2012 1146   K 4.2 10/17/2012 1146   CL 106 10/17/2012 1146   CO2 29 10/17/2012 1146   GLUCOSE 78 10/17/2012 1146   BUN 17 10/17/2012 1146   CREATININE 0.73 10/17/2012 1146   CALCIUM 9.0 10/17/2012 1146   PROT 6.7 10/17/2012 1146   ALBUMIN 4.2 10/17/2012 1146   AST 25 10/17/2012 1146   ALT 24 10/17/2012 1146   ALKPHOS 67 10/17/2012 1146   BILITOT 0.6 10/17/2012 1146   Lab Results  Component Value Date   CHOL 161 10/17/2012   HDL 48 10/17/2012   LDLCALC 66 10/17/2012   TRIG 236* 10/17/2012   CHOLHDL 3.4 10/17/2012   Lab Results  Component Value Date   HGBA1C 5.4 10/17/2012   No results found for: [id number] Lab Results  Component Value Date   TSH 1.352 10/17/2012      ASSESSMENT AND PLAN  Multiple sclerosis  Chronic fatigue  Optic neuritis  Ataxic gait  In summary, Raven Montoya is a 54 year old woman with multiple sclerosis for the  past 21 years. She has done well on Gilenya therapy without any exacerbations recently. She had an MRI of the brain performed a [facility] last year that was reportedly unchanged from the previous one. Since she is doing well we will continue the therapy and check labs to make sure she is not having any side effects that will need to be addressed. I will also increase her Adderall from 15 mg to 20 mg to see if that benefits her fatigue more.  She will come back to see [place] in about 5 months or call sooner if she has new or worsening neurologic symptoms.   Richard A. Felecia Shelling, MD, PhD 2/45/8099, 83:38 AM Certified in Neurology, Clinical Neurophysiology, Sleep Medicine, Pain Medicine and Neuroimaging  [facility] [address] [place], [place] [address] [phone]

## 2014-03-25 ENCOUNTER — Ambulatory Visit: Payer: BC Managed Care – PPO | Admitting: Certified Nurse Midwife

## 2014-03-27 ENCOUNTER — Other Ambulatory Visit: Payer: Self-pay | Admitting: Neurology

## 2014-03-27 MED ORDER — AMPHETAMINE-DEXTROAMPHET ER 20 MG PO CP24: 20.0000 mg | ORAL_CAPSULE | Freq: Every day | ORAL | Status: DC

## 2014-03-27 NOTE — Telephone Encounter (Signed)
Patient calling for  refill of Adderall XR 20 mg. [phone] is her best number to call

## 2014-03-27 NOTE — Telephone Encounter (Signed)
Request entered, forwarded to provider for approval.  

## 2014-03-28 ENCOUNTER — Ambulatory Visit (INDEPENDENT_AMBULATORY_CARE_PROVIDER_SITE_OTHER): Payer: BC Managed Care – PPO | Admitting: Certified Nurse Midwife

## 2014-03-28 ENCOUNTER — Encounter: Payer: Self-pay | Admitting: Certified Nurse Midwife

## 2014-03-28 VITALS — BP 114/70 | HR 68 | Resp 16 | Ht 63.75 in | Wt 222.0 lb

## 2014-03-28 DIAGNOSIS — Z01419 Encounter for gynecological examination (general) (routine) without abnormal findings: Secondary | ICD-10-CM

## 2014-03-28 DIAGNOSIS — Z0000 Encounter for general adult medical examination without abnormal findings: Secondary | ICD-10-CM

## 2014-03-28 DIAGNOSIS — N912 Amenorrhea, unspecified: Secondary | ICD-10-CM

## 2014-03-28 DIAGNOSIS — A6009 Herpesviral infection of other urogenital tract: Secondary | ICD-10-CM

## 2014-03-28 DIAGNOSIS — A609 Anogenital herpesviral infection, unspecified: Secondary | ICD-10-CM

## 2014-03-28 DIAGNOSIS — Z124 Encounter for screening for malignant neoplasm of cervix: Secondary | ICD-10-CM

## 2014-03-28 LAB — POCT URINALYSIS DIPSTICK
Bilirubin, UA: NEGATIVE
Blood, UA: NEGATIVE
Glucose, UA: NEGATIVE
KETONES UA: NEGATIVE
LEUKOCYTES UA: NEGATIVE
NITRITE UA: NEGATIVE
PH UA: 5
PROTEIN UA: NEGATIVE
UROBILINOGEN UA: NEGATIVE

## 2014-03-28 LAB — POCT URINE PREGNANCY: Preg Test, Ur: NEGATIVE

## 2014-03-28 MED ORDER — VALACYCLOVIR HCL 1 G PO TABS: 500.0000 mg | ORAL_TABLET | Freq: Every day | ORAL | Status: DC

## 2014-03-28 NOTE — Progress Notes (Signed)
54 y.o. G27P1001 Divorced  Caucasian Fe here for annual exam. Periods changing now with no period occasionally, some shorter than others, none any more frequent that 21 days. Occasional hot flashes and night sweats.  Sees Neurology for MS management, stable at present. Sees PCP for aex, labs. No health issues today.   Patient's last menstrual period was 02/15/2014.          Sexually active: Yes.    The current method of family planning is condoms all the time.    Exercising: No.  exercise Smoker:  no  Health Maintenance: Pap:  05-14-11 neg HPV HR neg MMG: 8/14 neg per patient Colonoscopy:  2011 BMD:   none TDaP:  2012 Labs: Poct urine-neg, Hgb-14.6 upt-neg Self breast exam: done monthly   reports that she has never smoked. She does not have any smokeless tobacco history on file. She reports that she drinks alcohol. She reports that she does not use illicit drugs.  Past Medical History  Diagnosis Date  . Multiple sclerosis     age 46  . 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 Shingles   . Memory loss   . Neuropathy   . Vision abnormalities     Past Surgical History  Procedure Laterality Date  . Cesarean section      Current Outpatient Prescriptions  Medication Sig Dispense Refill  . amphetamine-dextroamphetamine (ADDERALL XR) 20 MG 24 hr capsule Take 1 capsule (20 mg total) by mouth daily. 30 capsule 0  . calcium carbonate 200 MG capsule Take 250 mg by mouth.    . Cholecalciferol (VITAMIN D3) 400 UNITS CAPS Take 1,000 Units by mouth.    . escitalopram (LEXAPRO) 20 MG tablet Take 20 mg by mouth.    . Fingolimod HCl 0.5 MG CAPS Take 0.5 mg by mouth.    . IRON PO Take by mouth. occ    . meloxicam (MOBIC) 7.5 MG tablet Take 1 tablet (7.5 mg total) by mouth daily. 30 tablet 5  . valACYclovir (VALTREX) 1000 MG tablet Take 0.5 tablets (500 mg total) by mouth daily. 30 tablet 12  . vitamin B-12 (CYANOCOBALAMIN) 1000 MCG tablet Take 1,000 mcg by mouth.     No current  facility-administered medications for this visit.    Family History  Problem Relation Age of Onset  . Osteoporosis Mother   . Heart disease Mother   . Cancer Father     colon  . Heart disease Brother     ROS:  Pertinent items are noted in HPI.  Otherwise, a comprehensive ROS was negative.  Exam:   BP 114/70 mmHg  Pulse 68  Resp 16  Ht 5' 3.75" (1.619 m)  Wt 222 lb (100.699 kg)  BMI 38.42 kg/m2  LMP 02/15/2014 Height: 5' 3.75" (161.9 cm) Ht Readings from Last 3 Encounters:  03/28/14 5' 3.75" (1.619 m)  10/17/12 5' 3.75" (1.619 m)    General appearance: alert, cooperative and appears stated age Head: Normocephalic, without obvious abnormality, atraumatic Neck: no adenopathy, supple, symmetrical, trachea midline and thyroid normal to inspection and palpation Lungs: clear to auscultation bilaterally Breasts: normal appearance, no masses or tenderness, No nipple retraction or dimpling, No nipple discharge or bleeding, No axillary or supraclavicular adenopathy Heart: regular rate and rhythm Abdomen: soft, non-tender; no masses,  no organomegaly Extremities: extremities normal, atraumatic, no cyanosis or edema Skin: Skin color, texture, turgor normal. No rashes or lesions Lymph nodes: Cervical, supraclavicular, and axillary nodes normal. No  abnormal inguinal nodes palpated Neurologic: Grossly normal   Pelvic: External genitalia:  no lesions              Urethra:  normal appearing urethra with no masses, tenderness or lesions              Bartholin's and Skene's: normal                 Vagina: normal appearing vagina with normal color and discharge, no lesions              Cervix: normal,non tender, no lesions              Pap taken: Yes.   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

## 2014-03-28 NOTE — Patient Instructions (Signed)
EXERCISE AND DIET:  We recommended that you start or continue a regular exercise program for good health. Regular exercise means any activity that makes your heart beat faster and makes you sweat.  We recommend exercising at least 30 minutes per day at least 3 days a week, preferably 4 or 5.  We also recommend a diet low in fat and sugar.  Inactivity, poor dietary choices and obesity can cause diabetes, heart attack, stroke, and kidney damage, among others.    ALCOHOL AND SMOKING:  Women should limit their alcohol intake to no more than 7 drinks/beers/glasses of wine (combined, not each!) per week. Moderation of alcohol intake to this level decreases your risk of breast cancer and liver damage. And of course, no recreational drugs are part of a healthy lifestyle.  And absolutely no smoking or even second hand smoke. Most people know smoking can cause heart and lung diseases, but did you know it also contributes to weakening of your bones? Aging of your skin?  Yellowing of your teeth and nails?  CALCIUM AND VITAMIN D:  Adequate intake of calcium and Vitamin D are recommended.  The recommendations for exact amounts of these supplements seem to change often, but generally speaking 600 mg of calcium (either carbonate or citrate) and 800 units of Vitamin D per day seems prudent. Certain women may benefit from higher intake of Vitamin D.  If you are among these women, your doctor will have told you during your visit.    PAP SMEARS:  Pap smears, to check for cervical cancer or precancers,  have traditionally been done yearly, although recent scientific advances have shown that most women can have pap smears less often.  However, every woman still should have a physical exam from her gynecologist every year. It will include a breast check, inspection of the vulva and vagina to check for abnormal growths or skin changes, a visual exam of the cervix, and then an exam to evaluate the size and shape of the uterus and  ovaries.  And after 52 years of age, a rectal exam is indicated to check for rectal cancers. We will also provide age appropriate advice regarding health maintenance, like when you should have certain vaccines, screening for sexually transmitted diseases, bone density testing, colonoscopy, mammograms, etc.   MAMMOGRAMS:  All women over 40 years old should have a yearly mammogram. Many facilities now offer a "3D" mammogram, which may cost around $50 extra out of pocket. If possible,  we recommend you accept the option to have the 3D mammogram performed.  It both reduces the number of women who will be called back for extra views which then turn out to be normal, and it is better than the routine mammogram at detecting truly abnormal areas.    COLONOSCOPY:  Colonoscopy to screen for colon cancer is recommended for all women at age 50.  We know, you hate the idea of the prep.  We agree, BUT, having colon cancer and not knowing it is worse!!  Colon cancer so often starts as a polyp that can be seen and removed at colonscopy, which can quite literally save your life!  And if your first colonoscopy is normal and you have no family history of colon cancer, most women don't have to have it again for 10 years.  Once every ten years, you can do something that may end up saving your life, right?  We will be happy to help you get it scheduled when you are ready.    Be sure to check your insurance coverage so you understand how much it will cost.  It may be covered as a preventative service at no cost, but you should check your particular policy.     Perimenopause Perimenopause is the time when your body begins to move into the menopause (no menstrual period for 12 straight months). It is a natural process. Perimenopause can begin 2-8 years before the menopause and usually lasts for 1 year after the menopause. During this time, your ovaries may or may not produce an egg. The ovaries vary in their production of estrogen and  progesterone hormones each month. This can cause irregular menstrual periods, difficulty getting pregnant, vaginal bleeding between periods, and uncomfortable symptoms. CAUSES  Irregular production of the ovarian hormones, estrogen and progesterone, and not ovulating every month.  Other causes include:  Tumor of the pituitary gland in the brain.  Medical disease that affects the ovaries.  Radiation treatment.  Chemotherapy.  Unknown causes.  Heavy smoking and excessive alcohol intake can bring on perimenopause sooner. SIGNS AND SYMPTOMS   Hot flashes.  Night sweats.  Irregular menstrual periods.  Decreased sex drive.  Vaginal dryness.  Headaches.  Mood swings.  Depression.  Memory problems.  Irritability.  Tiredness.  Weight gain.  Trouble getting pregnant.  The beginning of losing bone cells (osteoporosis).  The beginning of hardening of the arteries (atherosclerosis). DIAGNOSIS  Your health care provider will make a diagnosis by analyzing your age, menstrual history, and symptoms. He or she will do a physical exam and note any changes in your body, especially your female organs. Female hormone tests may or may not be helpful depending on the amount of female hormones you produce and when you produce them. However, other hormone tests may be helpful to rule out other problems. TREATMENT  In some cases, no treatment is needed. The decision on whether treatment is necessary during the perimenopause should be made by you and your health care provider based on how the symptoms are affecting you and your lifestyle. Various treatments are available, such as:  Treating individual symptoms with a specific medicine for that symptom.  Herbal medicines that can help specific symptoms.  Counseling.  Group therapy. HOME CARE INSTRUCTIONS   Keep track of your menstrual periods (when they occur, how heavy they are, how long between periods, and how long they last) as  well as your symptoms and when they started.  Only take over-the-counter or prescription medicines as directed by your health care provider.  Sleep and rest.  Exercise.  Eat a diet that contains calcium (good for your bones) and soy (acts like the estrogen hormone).  Do not smoke.  Avoid alcoholic beverages.  Take vitamin supplements as recommended by your health care provider. Taking vitamin E may help in certain cases.  Take calcium and vitamin D supplements to help prevent bone loss.  Group therapy is sometimes helpful.  Acupuncture may help in some cases. SEEK MEDICAL CARE IF:   You have questions about any symptoms you are having.  You need a referral to a specialist (gynecologist, psychiatrist, or psychologist). SEEK IMMEDIATE MEDICAL CARE IF:   You have vaginal bleeding.  Your period lasts longer than 8 days.  Your periods are recurring sooner than 21 days.  You have bleeding after intercourse.  You have severe depression.  You have pain when you urinate.  You have severe headaches.  You have vision problems. Document Released: 03/04/2004 Document Revised: 11/15/2012 Document Reviewed: 08/24/2012 ExitCare  Patient Information 2015 [employer]. This information is not intended to replace advice given to you by your health care provider. Make sure you discuss any questions you have with your health care provider.  

## 2014-03-31 NOTE — Progress Notes (Signed)
Reviewed personally.  M. Suzanne Joseangel Nettleton, MD.  

## 2014-04-01 LAB — HEMOGLOBIN, FINGERSTICK: HEMOGLOBIN, FINGERSTICK: 14.6 g/dL (ref 12.0–16.0)

## 2014-04-01 LAB — IPS PAP TEST WITH REFLEX TO HPV

## 2014-04-02 ENCOUNTER — Other Ambulatory Visit: Payer: Self-pay | Admitting: Certified Nurse Midwife

## 2014-04-02 DIAGNOSIS — Z124 Encounter for screening for malignant neoplasm of cervix: Secondary | ICD-10-CM

## 2014-04-03 NOTE — Addendum Note (Signed)
Addended by: Kem Boroughs R on: 04/03/2014 10:15 AM   Modules accepted: Orders

## 2014-04-04 LAB — IPS HPV ON A LIQUID BASED SPECIMEN

## 2014-04-04 NOTE — Addendum Note (Signed)
Addended by: Abelino Derrick C on: 04/04/2014 11:40 AM   Modules accepted: Orders

## 2014-04-23 ENCOUNTER — Telehealth: Payer: Self-pay | Admitting: Certified Nurse Midwife

## 2014-04-23 NOTE — Telephone Encounter (Signed)
Pt had aex in February and pt told Ms Jackelyn Poling she hadn't had her cycle yet and a few days later she started spotting, then had normal flow and again into spotting. Pt is still having spotting and is concerned for length of time its gone on.

## 2014-04-23 NOTE — Telephone Encounter (Signed)
Spoke with patient. Patient states that she had not had her cycle when she came in for her annual with Regina Eck CNM on 03/28/2014. "Debbi told me to watch it until April and let her know how my bleeding was if I had any. Or if I did not get my cycle. I started on 2/20 with a regular cycle and then it got light like a spotting and has been going on ever since." Denies any abdominal discomfort, feeling of weakness, or dizziness. "I am not sure if I am going through the change or not. I have a history of anemia too." Advised patient will need to be seen for evaluation with Regina Eck CNM. Patient is agreeable. Appointment scheduled for tomorrow at 10:30am. Patient is agreeable to date and time.  Routing to provider for final review. Patient agreeable to disposition. Will close encounter

## 2014-04-24 ENCOUNTER — Encounter: Payer: Self-pay | Admitting: Certified Nurse Midwife

## 2014-04-24 ENCOUNTER — Ambulatory Visit (INDEPENDENT_AMBULATORY_CARE_PROVIDER_SITE_OTHER): Payer: BC Managed Care – PPO | Admitting: Certified Nurse Midwife

## 2014-04-24 ENCOUNTER — Telehealth: Payer: Self-pay | Admitting: Certified Nurse Midwife

## 2014-04-24 VITALS — BP 120/78 | HR 72 | Resp 16 | Ht 63.75 in | Wt 222.0 lb

## 2014-04-24 DIAGNOSIS — N951 Menopausal and female climacteric states: Secondary | ICD-10-CM

## 2014-04-24 DIAGNOSIS — N938 Other specified abnormal uterine and vaginal bleeding: Secondary | ICD-10-CM | POA: Diagnosis not present

## 2014-04-24 LAB — CBC
HCT: 40.7 % (ref 36.0–46.0)
Hemoglobin: 13.6 g/dL (ref 12.0–15.0)
MCH: 30.6 pg (ref 26.0–34.0)
MCHC: 33.4 g/dL (ref 30.0–36.0)
MCV: 91.7 fL (ref 78.0–100.0)
MPV: 9.4 fL (ref 8.6–12.4)
Platelets: 280 K/uL (ref 150–400)
RBC: 4.44 MIL/uL (ref 3.87–5.11)
RDW: 14.4 % (ref 11.5–15.5)
WBC: 4.7 K/uL (ref 4.0–10.5)

## 2014-04-24 LAB — TSH: TSH: 1.822 u[IU]/mL (ref 0.350–4.500)

## 2014-04-24 NOTE — Progress Notes (Signed)
54 y.o. Divorced Caucasian female G1P1001 here complaint of normal period on 03-31-14 which was 4 days late and bled normal period for 6 days and then continued to have bleeding off an on since that time. Bleeding will stop and may start the next day. No bleeding today. No clots or excessive amount. Will be due for next menses 04-28-14. Occasional hot flash, no other symptoms. No STD concerns. No other health issues today.  O: Healthy WD,WN female Affect: normal Skin:warm and dry Abdomen:soft non tender, no masses palpate, negative suprapubic  Pelvic exam:EXTERNAL GENITALIA: normal appearing vulva with no masses, tenderness or lesions VAGINA: no abnormal discharge or lesions and scant discharge and scant blood noted CERVIX: no lesions or cervical motion tenderness and scant blood noted from cervis UTERUS: normal,non tender, ADNEXA: no masses palpable and nontender  A:? Perimenopausal changes in one menses cycle Normal pelvic exam   P: Discussed findings of normal pelvic exam. Discussed etiology of perimenopausal and bleeding expectations. Also discussed weight change and cycle change connection. Discussed thyroid effect on cycle also. Recommend labs. Agreeable. She has menses calendar and will continue to keep record of menses. Advise if next cycle continues as this one has, she may need further evaluation. Agreeable  Labs FSH,TSH,CBC   RV prn as above

## 2014-04-24 NOTE — Telephone Encounter (Signed)
Patient is scheduled to come in this morning for irregular bleeding for over a month. She states her bleeding has almost stopped today and is wondering if it is still necessary fpr her to come in today.

## 2014-04-24 NOTE — Patient Instructions (Signed)

## 2014-04-24 NOTE — Telephone Encounter (Signed)
Spoke with patient. She called yesterday and was scheduled to see Regina Eck CNM today regarding bleeding x 1 month. Patient states this morning she woke up and bleeding has stopped. Patient wants to know if Regina Eck CNM would still like to see her. Advised patient that it is ultimately her choice to keep appointment but that if she had concerns yesterday with ongoing bleeding and hx of anemia that this RN recommends she keep appointment as scheduled. Patient agreeable. Routing to provider for final review. Patient agreeable to disposition. Will close encounter

## 2014-04-25 LAB — FOLLICLE STIMULATING HORMONE: FSH: 15.1 m[IU]/mL

## 2014-04-25 NOTE — Progress Notes (Signed)
Reviewed personally.  M. Suzanne Ashten Sarnowski, MD.  

## 2014-04-26 ENCOUNTER — Telehealth: Payer: Self-pay | Admitting: *Deleted

## 2014-04-26 ENCOUNTER — Telehealth: Payer: Self-pay

## 2014-04-26 NOTE — Telephone Encounter (Signed)
Left Message To Call Back  

## 2014-04-26 NOTE — Telephone Encounter (Signed)
Patient states she is returning phone call from office. No telephone note open. Spoke with patient at time of incoming call. Advised of results as seen below. Patient is agreeable and verbalizes understanding.  Notes Recorded by Antonietta Barcelona, FNP on 04/26/2014 at 8:16 AM Please let patient know that her TSH, FSH, and CBC is all normal. There was no anemia as she had problems with this last year. The [facility] shows she is not menopausal but perimenopausal. So continue to monitor menses with a calendar and report amenorrhea over 3 months or continued or heavy bleeding.  Routing to provider for final review. Patient agreeable to disposition. Will close encounter

## 2014-04-26 NOTE — Telephone Encounter (Signed)
-----   Message from Antonietta Barcelona, [place] sent at 04/26/2014  8:16 AM EDT ----- Please let patient know that her TSH, FSH, and CBC is all normal.  There was no anemia as she had problems with this last year.  The [facility] shows she is not menopausal but perimenopausal.  So continue to monitor menses with a calendar and report amenorrhea over 3 months or continued or heavy bleeding.

## 2014-05-01 ENCOUNTER — Other Ambulatory Visit: Payer: Self-pay | Admitting: Neurology

## 2014-05-01 MED ORDER — AMPHETAMINE-DEXTROAMPHET ER 20 MG PO CP24: 20.0000 mg | ORAL_CAPSULE | Freq: Every day | ORAL | Status: DC

## 2014-05-01 NOTE — Telephone Encounter (Signed)
Patient is calling to get a written Rx for amphetamine-dextroamphetamine (ADDERALL XR) 20 MG 24 hr capsule. Please call patient when ready for pickup. Thank you.

## 2014-05-06 NOTE — Telephone Encounter (Signed)
Patient notified by Alesia Richards, RN 04/26/14

## 2014-05-28 ENCOUNTER — Ambulatory Visit: Payer: BC Managed Care – PPO | Admitting: Certified Nurse Midwife

## 2014-06-10 ENCOUNTER — Telehealth: Payer: Self-pay | Admitting: Neurology

## 2014-06-10 MED ORDER — AMPHETAMINE-DEXTROAMPHET ER 20 MG PO CP24: 20.0000 mg | ORAL_CAPSULE | Freq: Every day | ORAL | Status: DC

## 2014-06-10 NOTE — Telephone Encounter (Signed)
Patient called requesting a refill for amphetamine-dextroamphetamine (ADDERALL XR) 20 MG 24 hr capsule. Patient is aware that the script will be ready for pick up in 24hrs unless advised otherwise from the nurse.

## 2014-06-10 NOTE — Telephone Encounter (Signed)
Adderall rx. printed, signed, up front [facility]/fim

## 2014-07-23 ENCOUNTER — Telehealth: Payer: Self-pay | Admitting: Neurology

## 2014-07-23 MED ORDER — ESCITALOPRAM OXALATE 20 MG PO TABS: 20.0000 mg | ORAL_TABLET | Freq: Every day | ORAL | Status: DC

## 2014-07-23 NOTE — Telephone Encounter (Signed)
Patient called and requested to speak with Faith RN. Regarding her Rx. escitalopram (LEXAPRO) 20 MG tablet. She states that the pharmacy told her the medication refill had been denied. Please call and advise.

## 2014-07-23 NOTE — Telephone Encounter (Signed)
I have spoken with DeeDee and advised I have not received a call from [facility] regarding Lexapro--I wonder if they sent request to [facility]?  At any rate--r/f granted today/fim

## 2014-08-14 ENCOUNTER — Other Ambulatory Visit: Payer: Self-pay | Admitting: Neurology

## 2014-08-14 MED ORDER — AMPHETAMINE-DEXTROAMPHET ER 20 MG PO CP24: 20.0000 mg | ORAL_CAPSULE | Freq: Every day | ORAL | Status: DC

## 2014-08-14 NOTE — Telephone Encounter (Signed)
Request entered, forwarded to provider for approval.  

## 2014-08-14 NOTE — Telephone Encounter (Signed)
Patient is calling to order written Rx Adderall XR.  Thanks!

## 2014-08-15 ENCOUNTER — Encounter: Payer: Self-pay | Admitting: *Deleted

## 2014-08-15 NOTE — Progress Notes (Signed)
Adderall rx. up front [facility]/fim 

## 2014-09-18 ENCOUNTER — Other Ambulatory Visit: Payer: Self-pay | Admitting: *Deleted

## 2014-09-18 MED ORDER — AMPHETAMINE-DEXTROAMPHET ER 20 MG PO CP24: 20.0000 mg | ORAL_CAPSULE | Freq: Every day | ORAL | Status: DC

## 2014-09-19 ENCOUNTER — Encounter: Payer: Self-pay | Admitting: *Deleted

## 2014-09-19 NOTE — Progress Notes (Signed)
Adderall rx. up front [facility]/fim 

## 2014-10-23 ENCOUNTER — Encounter: Payer: Self-pay | Admitting: *Deleted

## 2014-10-23 ENCOUNTER — Other Ambulatory Visit: Payer: Self-pay | Admitting: Neurology

## 2014-10-23 MED ORDER — AMPHETAMINE-DEXTROAMPHET ER 20 MG PO CP24: 20.0000 mg | ORAL_CAPSULE | Freq: Every day | ORAL | Status: DC

## 2014-10-23 NOTE — Telephone Encounter (Deleted)
I call

## 2014-10-23 NOTE — Progress Notes (Signed)
Adderall rx. up front [facility]/fim 

## 2014-10-23 NOTE — Telephone Encounter (Signed)
Patient is calling to get a written Rx for amphetamine-dextroamphetamine (ADDERALL XR) 20 MG 24 hr capsule. I advised the Rx will be ready in 24 hours unless otherwise advised by the nurse. Thank you.

## 2014-11-25 ENCOUNTER — Telehealth: Payer: Self-pay | Admitting: Neurology

## 2014-11-25 NOTE — Telephone Encounter (Signed)
Thank you :)

## 2014-11-25 NOTE — Telephone Encounter (Signed)
Raven Montoya with [facility] [phone]) called stating she is working on getting new prior auth for [place] that will expire 12/11/14.

## 2014-12-03 NOTE — Telephone Encounter (Signed)
[facility] was not able to initiate Prior Auth.  I have contacted ins and provided clinical info.  Request for continuation of coverage on Gilenya has been approved effective until 11/28/2015 Ref # [id number]

## 2014-12-11 ENCOUNTER — Encounter: Payer: Self-pay | Admitting: Certified Nurse Midwife

## 2014-12-18 ENCOUNTER — Encounter: Payer: Self-pay | Admitting: *Deleted

## 2014-12-18 ENCOUNTER — Other Ambulatory Visit: Payer: Self-pay | Admitting: Neurology

## 2014-12-18 MED ORDER — FINGOLIMOD HCL 0.5 MG PO CAPS: 0.5000 mg | ORAL_CAPSULE | Freq: Every day | ORAL | Status: DC

## 2014-12-18 MED ORDER — AMPHETAMINE-DEXTROAMPHET ER 20 MG PO CP24: 20.0000 mg | ORAL_CAPSULE | Freq: Every day | ORAL | Status: DC

## 2014-12-18 NOTE — Telephone Encounter (Signed)
Pt returned Jessica's call. Current specialty pharmacy is [facility]. New specialty pharmacy in January 2017 will [facility].

## 2014-12-18 NOTE — Telephone Encounter (Signed)
Pt called requesting refill for amphetamine-dextroamphetamine (ADDERALL XR) 20 MG 24 hr capsule . Pt advised RX will be ready within 24 hrs unless informed otherwise from RN. She also needs refill for Gilenya ,has 1 week left. Also employer sts specialty pharmacy will change in January 2017. Please call and advise at [phone].

## 2014-12-18 NOTE — Telephone Encounter (Signed)
Request for Adderall entered, forwarded to provider for approval.   I called back regarding Gilenya.  Got no answer, left message.  If patient calls back, please verify what specialty pharmacy Gilenya needs to be sent to at the present time, and if patient has info for new pharm starting in Jan, that can be obtained as well so we can add it to the chart.  Thank you!

## 2014-12-18 NOTE — Telephone Encounter (Signed)
Thank you! Info has been updated.

## 2014-12-18 NOTE — Progress Notes (Signed)
Adderall rx. up front [facility]/fim 

## 2015-01-08 ENCOUNTER — Encounter: Payer: Self-pay | Admitting: Neurology

## 2015-01-08 ENCOUNTER — Ambulatory Visit (INDEPENDENT_AMBULATORY_CARE_PROVIDER_SITE_OTHER): Payer: BC Managed Care – PPO | Admitting: Neurology

## 2015-01-08 VITALS — BP 120/86 | HR 68 | Resp 16 | Ht 63.75 in | Wt 230.4 lb

## 2015-01-08 DIAGNOSIS — G35 Multiple sclerosis: Secondary | ICD-10-CM | POA: Diagnosis not present

## 2015-01-08 DIAGNOSIS — F329 Major depressive disorder, single episode, unspecified: Secondary | ICD-10-CM

## 2015-01-08 DIAGNOSIS — F988 Other specified behavioral and emotional disorders with onset usually occurring in childhood and adolescence: Secondary | ICD-10-CM

## 2015-01-08 DIAGNOSIS — F909 Attention-deficit hyperactivity disorder, unspecified type: Secondary | ICD-10-CM

## 2015-01-08 DIAGNOSIS — F32A Depression, unspecified: Secondary | ICD-10-CM

## 2015-01-08 DIAGNOSIS — R269 Unspecified abnormalities of gait and mobility: Secondary | ICD-10-CM

## 2015-01-08 DIAGNOSIS — R5383 Other fatigue: Secondary | ICD-10-CM | POA: Insufficient documentation

## 2015-01-08 DIAGNOSIS — F418 Other specified anxiety disorders: Secondary | ICD-10-CM | POA: Insufficient documentation

## 2015-01-08 MED ORDER — MELOXICAM 7.5 MG PO TABS: 7.5000 mg | ORAL_TABLET | Freq: Every day | ORAL | Status: DC

## 2015-01-08 MED ORDER — AMPHETAMINE-DEXTROAMPHET ER 20 MG PO CP24: 20.0000 mg | ORAL_CAPSULE | Freq: Every day | ORAL | Status: DC

## 2015-01-08 MED ORDER — TRAZODONE HCL 100 MG PO TABS: 100.0000 mg | ORAL_TABLET | Freq: Every day | ORAL | Status: DC

## 2015-01-08 NOTE — Progress Notes (Addendum)
[facility]  PATIENT: Raven Montoya DOB: [birth year]  REFERRING CLINICIAN: Anastasia Montoya HISTORY FROM: Patient REASON FOR VISIT: MS   HISTORICAL  CHIEF COMPLAINT:  Chief Complaint  Patient presents with  . Multiple Sclerosis    Sts. she continues to tolerate Gilenya well.  Sts. she has had 2 episodes that she believes to be anxiety attacks--would like to discuss this./fim  . Fatigue    Sts. fatigue is improved since Adderall was increased to 20mg  daily.  Sts. she had labwork done recently at [facility] (phone # [phone]) and liver enzymes were elevated/fim    HISTORY OF PRESENT ILLNESS:  Raven Montoya is a 54 year old woman with MS.    she is on Gilenya now for about 2 years. She tolerates it well. However, she does report that her last blood work performed at her primary care doctor's office show that her liver function tests were elevated. I do not have the actual lab values so I do not know how high the elevation was.  She feels the MS itself is stable with no new exacerbation recently. Her last MRI was about 2 years ago, before she started Gilenya.  Her main symptoms at this point are fatigue and some tingling in the hands and feet does not note any difficulty with her bladder and currently does not have any problems with her vision.  Gait/strnegth/sensation:    Gait is good moist days but she trips now and then sand sometimes falls.   There is no weakness in the arms or legs.    She reports mild tingling  In legs > arms, often after laying down a while. She does not have any when active.  This also occurs if she has been driving for long time.  There is no painful dysesthesias.   Bladder/bowel:   She denies any significant issues with these.  Vision:   She denies any MS related vision problem.   She wears reading glasses.    Fatigue/Sleep:   Her fatigue is cognitive greater than physical. She drives all day long and gets tired quicker than  she used to.     Adderall XR 20 mg daily helps her focus better.  Cognition is better than  it used to.    She has mild sleep onset insomnia some nights but has more sleep maintenance insomnia most nights.     She notes some depression and mild anxiety.   However, she had a recent night where she felt panicky  Off/on x hours without any obvious trigger.    Her mother lives with her and she is a very depressive person.  MS History:  She was diagnosed with multiple sclerosis at age 49 after presenting with optic neuritis in the left eye. Her neurologist discussed with her that she probably had MS but no medication was started at that time. Vision got better over the next few weeks. A couple years later, she went to see Dr. Effie Shy. He felt that the MS should be treated and she was started on Rebif. She remain on Rebif for many years   She had not had any major exacerbations while on Rebif but had needles fatigue.    In 2014, she switched to [place]. She tolerates Gilenya well.   REVIEW OF SYSTEMS:  Constitutional: No fevers, chills, sweats, or change in appetite.   She has fatigue and insomnia Eyes: No visual changes, double vision, eye pain Ear, nose and throat: No hearing  loss, ear pain, nasal congestion, sore throat Cardiovascular: No chest pain, palpitations Respiratory:  No shortness of breath at rest or with exertion.   No wheezes GastrointestinaI: No nausea, vomiting, diarrhea, abdominal pain, fecal incontinence Genitourinary:  No dysuria, urinary retention or frequency.  No nocturia. Musculoskeletal:  No neck pain, back pain Integumentary: No rash, pruritus, skin lesions Neurological: as above Psychiatric: as above.    Endocrine: No palpitations, diaphoresis, change in appetite, change in weigh or increased thirst Hematologic/Lymphatic:  No anemia, purpura, petechiae. Allergic/Immunologic: No itchy/runny eyes, nasal congestion, recent allergic reactions, rashes  ALLERGIES: No  Known Allergies  HOME MEDICATIONS: Outpatient Prescriptions Prior to Visit  Medication Sig Dispense Refill  . amphetamine-dextroamphetamine (ADDERALL XR) 20 MG 24 hr capsule Take 1 capsule (20 mg total) by mouth daily. 30 capsule 0  . calcium carbonate 200 MG capsule Take 250 mg by mouth.    . Cholecalciferol (VITAMIN D3) 400 UNITS CAPS Take 1,000 Units by mouth.    . escitalopram (LEXAPRO) 20 MG tablet Take 1 tablet (20 mg total) by mouth daily. 30 tablet 3  . Fingolimod HCl 0.5 MG CAPS Take 1 capsule (0.5 mg total) by mouth daily. 90 capsule 0  . IRON PO Take by mouth. occ    . valACYclovir (VALTREX) 1000 MG tablet Take 0.5 tablets (500 mg total) by mouth daily. 30 tablet 12  . vitamin B-12 (CYANOCOBALAMIN) 1000 MCG tablet Take 1,000 mcg by mouth.    . cyclobenzaprine (FLEXERIL) 10 MG tablet as needed.    . meloxicam (MOBIC) 7.5 MG tablet Take 1 tablet (7.5 mg total) by mouth daily. (Patient not taking: Reported on 01/08/2015) 30 tablet 5   No facility-administered medications prior to visit.    PAST MEDICAL HISTORY: Past Medical History  Diagnosis Date  . Multiple sclerosis ([place])     age 34  . 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 Shingles   . Memory loss   . Neuropathy ([place])   . Vision abnormalities     PAST SURGICAL HISTORY: Past Surgical History  Procedure Laterality Date  . Cesarean section      FAMILY HISTORY: Family History  Problem Relation Age of Onset  . Osteoporosis Mother   . Heart disease Mother   . Cancer Father     colon  . Heart disease Brother     SOCIAL HISTORY:  Social History   Social History  . Marital Status: Divorced    Spouse Name: N/A  . Number of Children: N/A  . Years of Education: N/A   Occupational History  . Not on file.   Social History Main Topics  . Smoking status: Never Smoker   . Smokeless tobacco: Not on file  . Alcohol Use: Yes     Comment: rare  . Drug Use: No  . Sexual Activity:    Partners:  Male    Birth Control/ Protection: Condom   Other Topics Concern  . Not on file   Social History Narrative     PHYSICAL EXAM  Filed Vitals:   01/08/15 0956  BP: 120/86  Pulse: 68  Resp: 16  Height: 5' 3.75" (1.619 m)  Weight: 230 lb 6.4 oz (104.509 kg)    Body mass index is 39.87 kg/(m^2).   General: The patient is well-developed and well-nourished and in no acute distress  Eyes:  Funduscopic exam shows normal optic discs and retinal vessels.  Neck: The neck is supple, no carotid bruits are  noted.  The neck is nontender.  Respiratory: The respiratory examination is clear.  Cardiovascular: The cardiovascular examination reveals a regular rate and rhythm, no murmurs, gallops or rubs are noted.  Skin: Extremities are without significant edema.  Neurologic Exam  Mental status: The patient is alert and oriented x 3 at the time of the examination. The patient has apparent normal recent and remote memory, with an apparently normal attention span and concentration ability.   Speech is normal.  Cranial nerves: Extraocular movements are full. Pupils are equal, round, and reactive to light and accomodation.  Visual fields are full.  Facial symmetry is present. There is good facial sensation to soft touch bilaterally.Facial strength is normal.  Trapezius and sternocleidomastoid strength is normal. No dysarthria is noted.  The tongue is midline, and the patient has symmetric elevation of the soft palate. No obvious hearing deficits are noted.  Motor:  Muscle bulk and tone are normal. Strength is  5 / 5 in all 4 extremities.   Sensory: Sensory testing is intact to pinprick, soft touch, vibration sensation, and position sense on all 4 extremities.  Coordination: Cerebellar testing reveals good finger-nose-finger and heel-to-shin bilaterally.  Gait and station: Station and gait are normal. Tandem gait is wide. Romberg is negative.   Reflexes: Deep tendon reflexes are symmetric and  brisk bilaterally. Marland Kitchen    DIAGNOSTIC DATA (LABS, IMAGING, TESTING) - I reviewed patient records, labs, notes, testing and imaging myself where available.  Lab Results  Component Value Date   WBC 4.7 04/24/2014   HGB 13.6 04/24/2014   HCT 40.7 04/24/2014   MCV 91.7 04/24/2014   PLT 280 04/24/2014      Component Value Date/Time   NA 137 10/17/2012 1146   K 4.2 10/17/2012 1146   CL 106 10/17/2012 1146   CO2 29 10/17/2012 1146   GLUCOSE 78 10/17/2012 1146   BUN 17 10/17/2012 1146   CREATININE 0.73 10/17/2012 1146   CALCIUM 9.0 10/17/2012 1146   PROT 6.7 10/17/2012 1146   ALBUMIN 4.2 10/17/2012 1146   AST 25 10/17/2012 1146   ALT 24 10/17/2012 1146   ALKPHOS 67 10/17/2012 1146   BILITOT 0.6 10/17/2012 1146   Lab Results  Component Value Date   CHOL 161 10/17/2012   HDL 48 10/17/2012   LDLCALC 66 10/17/2012   TRIG 236* 10/17/2012   CHOLHDL 3.4 10/17/2012   Lab Results  Component Value Date   HGBA1C 5.4 10/17/2012   No results found for: VITAMINB12 Lab Results  Component Value Date   TSH 1.822 04/24/2014      ASSESSMENT AND PLAN Multiple sclerosis ([place]) - Plan: Hepatic function panel, CBC with Differential/Platelet, MR Brain W Wo Contrast  Clinical depression  Other fatigue - Plan: Hepatic function panel, CBC with Differential/Platelet  Attention deficit disorder  Depression with anxiety  Gait disturbance - Plan: MR Brain W Wo Contrast   1.  Continue Gilenya. It is possible that her elevated liver function tests are coming from this medication and I would consider reducing to every other day if her liver function tests are just mildly elevated and rechecking in about 6 weeks. However, if they are more than mildly elevated she needs to follow through with the evaluation recommended by her primary care doctor. 2.   She will continue escitalopram for her depression and anxiety. If the anxiety worsens, consider either adding BuSpar or increasing her Lexapro. For  insomnia, and add low-dose trazodone at bedtime. 3.  She is advised to  exercise as tolerated and remain active. 4.  Check MRI of the brain to rule out subclinical progression. If present, consider a switch in disease modifying therapies.  She will come back to see [place] in about 4-5 months or call sooner if she has new or worsening neurologic symptoms.   Bijan Ridgley A. Felecia Shelling, MD, PhD [id number], [id number] AM Certified in Neurology, Clinical Neurophysiology, Sleep Medicine, Pain Medicine and Neuroimaging  [facility] [address], [place], [place] [address] [phone]

## 2015-01-09 ENCOUNTER — Telehealth: Payer: Self-pay | Admitting: *Deleted

## 2015-01-09 LAB — HEPATIC FUNCTION PANEL
ALT: 25 IU/L (ref 0–32)
AST: 19 IU/L (ref 0–40)
Albumin: 4.3 g/dL (ref 3.5–5.5)
Alkaline Phosphatase: 95 IU/L (ref 39–117)
Bilirubin Total: 0.6 mg/dL (ref 0.0–1.2)
Bilirubin, Direct: 0.19 mg/dL (ref 0.00–0.40)
Total Protein: 6.5 g/dL (ref 6.0–8.5)

## 2015-01-09 LAB — CBC WITH DIFFERENTIAL/PLATELET
BASOS: 0 %
Basophils Absolute: 0 10*3/uL (ref 0.0–0.2)
EOS (ABSOLUTE): 0.2 10*3/uL (ref 0.0–0.4)
EOS: 4 %
Hematocrit: 38.4 % (ref 34.0–46.6)
Hemoglobin: 13.1 g/dL (ref 11.1–15.9)
IMMATURE GRANS (ABS): 0 10*3/uL (ref 0.0–0.1)
IMMATURE GRANULOCYTES: 1 %
LYMPHS: 8 %
Lymphocytes Absolute: 0.3 10*3/uL — ABNORMAL LOW (ref 0.7–3.1)
MCH: 29 pg (ref 26.6–33.0)
MCHC: 34.1 g/dL (ref 31.5–35.7)
MCV: 85 fL (ref 79–97)
MONOCYTES: 13 %
Monocytes Absolute: 0.5 10*3/uL (ref 0.1–0.9)
NEUTROS PCT: 74 %
Neutrophils Absolute: 2.8 10*3/uL (ref 1.4–7.0)
PLATELETS: 264 10*3/uL (ref 150–379)
RBC: 4.51 x10E6/uL (ref 3.77–5.28)
RDW: 16.1 % — ABNORMAL HIGH (ref 12.3–15.4)
WBC: 3.8 10*3/uL (ref 3.4–10.8)

## 2015-01-09 NOTE — Telephone Encounter (Signed)
I have spoken with Raven Montoya this afternoon and per RAS, advised that lft's are completely normal, lymphocytes are low, as to be expected while she is on Gilenya,  She verbalized understanding of same/fim

## 2015-01-09 NOTE — Telephone Encounter (Signed)
-----   Message from Britt Bottom, MD sent at 01/09/2015  8:40 AM EST ----- Please let her know that the liver tests looked completely normal.    her blood count shows that the lymphocytes are low but we expect to see that with Gilenya.

## 2015-01-29 ENCOUNTER — Other Ambulatory Visit: Payer: BC Managed Care – PPO

## 2015-02-12 ENCOUNTER — Ambulatory Visit (INDEPENDENT_AMBULATORY_CARE_PROVIDER_SITE_OTHER): Payer: BC Managed Care – PPO

## 2015-02-12 DIAGNOSIS — G35 Multiple sclerosis: Secondary | ICD-10-CM | POA: Diagnosis not present

## 2015-02-12 DIAGNOSIS — R269 Unspecified abnormalities of gait and mobility: Secondary | ICD-10-CM

## 2015-02-12 MED ORDER — GADOPENTETATE DIMEGLUMINE 469.01 MG/ML IV SOLN: 20.0000 mL | Freq: Once | INTRAVENOUS | Status: AC | PRN

## 2015-02-13 ENCOUNTER — Telehealth: Payer: Self-pay | Admitting: *Deleted

## 2015-02-13 NOTE — Telephone Encounter (Signed)
-----   Message from Britt Bottom, MD sent at 02/12/2015  6:25 PM EST ----- Please let her know that the MRI of the brain is unchanged when compared to the one she had about a year and a half ago.

## 2015-02-13 NOTE — Telephone Encounter (Signed)
I have spoken with Raven Montoya this morning and per RAS, advised that RAS has compared her recent mri brain with the one she had about 18 mos. ago and there have been no changes.  She verbalized understanding of same/fim

## 2015-02-21 ENCOUNTER — Telehealth: Payer: Self-pay | Admitting: Neurology

## 2015-02-21 NOTE — Telephone Encounter (Signed)
[place].  I am not sure what she needs.  Does she need a pa for a med?  Need more details/fim

## 2015-02-21 NOTE — Telephone Encounter (Signed)
Pt called and states her insurance has changed will need a pre-quailification letter [facility].Fax: [phone]

## 2015-02-21 NOTE — Telephone Encounter (Signed)
Pt returned Faith's call. She said it is for  PA for gilenya. The insurance has not changed the pharmacy changed from [facility] to [facility].

## 2015-02-24 MED ORDER — FINGOLIMOD HCL 0.5 MG PO CAPS: 0.5000 mg | ORAL_CAPSULE | Freq: Every day | ORAL | Status: DC

## 2015-02-24 NOTE — Addendum Note (Signed)
Addended by: [place] Ravens I on: 02/24/2015 11:55 AM   Modules accepted: Orders

## 2015-02-24 NOTE — Telephone Encounter (Signed)
Rx. for Gilenya 0.5mg  sig: one po daily #90 with 3 r/f called to [place] with [facility] in [place], [place].  Phone # [phone]

## 2015-02-24 NOTE — Telephone Encounter (Signed)
I have spoken with [facility].  They report that pt. has a PA on file for Gilenya that is good thru 11-28-15.  I have spoken with Klarissa and let her know this.  She verbalized understanding of same./fim

## 2015-02-24 NOTE — Telephone Encounter (Signed)
[facility] and needs a new Rx sent to them for Gilenya- [id number], ext [phone]. Pts pharmacy's have changed.

## 2015-03-05 ENCOUNTER — Other Ambulatory Visit: Payer: Self-pay | Admitting: *Deleted

## 2015-03-05 ENCOUNTER — Telehealth: Payer: Self-pay | Admitting: Neurology

## 2015-03-05 MED ORDER — CYCLOBENZAPRINE HCL 10 MG PO TABS: 10.0000 mg | ORAL_TABLET | Freq: Every evening | ORAL | Status: DC | PRN

## 2015-03-05 NOTE — Telephone Encounter (Signed)
Pt called said when she took amphetamine-dextroamphetamine (ADDERALL XR) 20 MG 24 hr capsule RX to [facility] she was told it needed PA. Pt is out of medication. 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 She can handle not having for now.

## 2015-03-05 NOTE — Telephone Encounter (Signed)
LMOM to let pt. know Adderall XR 20mg  has been approved 03-05-15 thru 03-04-18.  She does not need to return this call unless she has questions/fim

## 2015-04-01 ENCOUNTER — Encounter: Payer: Self-pay | Admitting: Certified Nurse Midwife

## 2015-04-01 ENCOUNTER — Ambulatory Visit (INDEPENDENT_AMBULATORY_CARE_PROVIDER_SITE_OTHER): Payer: BC Managed Care – PPO | Admitting: Certified Nurse Midwife

## 2015-04-01 VITALS — BP 116/70 | HR 74 | Resp 16 | Ht 63.75 in | Wt 230.0 lb

## 2015-04-01 DIAGNOSIS — Z01419 Encounter for gynecological examination (general) (routine) without abnormal findings: Secondary | ICD-10-CM | POA: Diagnosis not present

## 2015-04-01 DIAGNOSIS — Z0000 Encounter for general adult medical examination without abnormal findings: Secondary | ICD-10-CM | POA: Diagnosis not present

## 2015-04-01 DIAGNOSIS — N912 Amenorrhea, unspecified: Secondary | ICD-10-CM

## 2015-04-01 LAB — POCT URINALYSIS DIPSTICK
BILIRUBIN UA: NEGATIVE
Blood, UA: NEGATIVE
Glucose, UA: NEGATIVE
Ketones, UA: NEGATIVE
LEUKOCYTES UA: NEGATIVE
Nitrite, UA: NEGATIVE
Protein, UA: NEGATIVE
Urobilinogen, UA: NEGATIVE
pH, UA: 5

## 2015-04-01 LAB — TSH: TSH: 1.71 mIU/L

## 2015-04-01 NOTE — Patient Instructions (Signed)
EXERCISE AND DIET:  We recommended that you start or continue a regular exercise program for good health. Regular exercise means any activity that makes your heart beat faster and makes you sweat.  We recommend exercising at least 30 minutes per day at least 3 days a week, preferably 4 or 5.  We also recommend a diet low in fat and sugar.  Inactivity, poor dietary choices and obesity can cause diabetes, heart attack, stroke, and kidney damage, among others.    ALCOHOL AND SMOKING:  Women should limit their alcohol intake to no more than 7 drinks/beers/glasses of wine (combined, not each!) per week. Moderation of alcohol intake to this level decreases your risk of breast cancer and liver damage. And of course, no recreational drugs are part of a healthy lifestyle.  And absolutely no smoking or even second hand smoke. Most people know smoking can cause heart and lung diseases, but did you know it also contributes to weakening of your bones? Aging of your skin?  Yellowing of your teeth and nails?  CALCIUM AND VITAMIN D:  Adequate intake of calcium and Vitamin D are recommended.  The recommendations for exact amounts of these supplements seem to change often, but generally speaking 600 mg of calcium (either carbonate or citrate) and 800 units of Vitamin D per day seems prudent. Certain women may benefit from higher intake of Vitamin D.  If you are among these women, your doctor will have told you during your visit.    PAP SMEARS:  Pap smears, to check for cervical cancer or precancers,  have traditionally been done yearly, although recent scientific advances have shown that most women can have pap smears less often.  However, every woman still should have a physical exam from her gynecologist every year. It will include a breast check, inspection of the vulva and vagina to check for abnormal growths or skin changes, a visual exam of the cervix, and then an exam to evaluate the size and shape of the uterus and  ovaries.  And after 53 years of age, a rectal exam is indicated to check for rectal cancers. We will also provide age appropriate advice regarding health maintenance, like when you should have certain vaccines, screening for sexually transmitted diseases, bone density testing, colonoscopy, mammograms, etc.   MAMMOGRAMS:  All women over 40 years old should have a yearly mammogram. Many facilities now offer a "3D" mammogram, which may cost around $50 extra out of pocket. If possible,  we recommend you accept the option to have the 3D mammogram performed.  It both reduces the number of women who will be called back for extra views which then turn out to be normal, and it is better than the routine mammogram at detecting truly abnormal areas.    COLONOSCOPY:  Colonoscopy to screen for colon cancer is recommended for all women at age 50.  We know, you hate the idea of the prep.  We agree, BUT, having colon cancer and not knowing it is worse!!  Colon cancer so often starts as a polyp that can be seen and removed at colonscopy, which can quite literally save your life!  And if your first colonoscopy is normal and you have no family history of colon cancer, most women don't have to have it again for 10 years.  Once every ten years, you can do something that may end up saving your life, right?  We will be happy to help you get it scheduled when you are ready.    Be sure to check your insurance coverage so you understand how much it will cost.  It may be covered as a preventative service at no cost, but you should check your particular policy.       Leg Cramps Leg cramps occur when a muscle or muscles tighten and you have no control over this tightening (involuntary muscle contraction). Muscle cramps can develop in any muscle, but the most common place is in the calf muscles of the leg. Those cramps can occur during exercise or when you are at rest. Leg cramps are painful, and they may last for a few seconds to a few  minutes. Cramps may return several times before they finally stop. Usually, leg cramps are not caused by a serious medical problem. In many cases, the cause is not known. Some common causes include:  Overexertion.  Overuse from repetitive motions, or doing the same thing over and over.  Remaining in a certain position for a long period of time.  Improper preparation, form, or technique while performing a sport or an activity.  Dehydration.  Injury.  Side effects of some medicines.  Abnormally low levels of the salts and ions in your blood (electrolytes), especially potassium and calcium. These levels could be low if you are taking water pills (diuretics) or if you are pregnant. HOME CARE INSTRUCTIONS Watch your condition for any changes. Taking the following actions may help to lessen any discomfort that you are feeling:  Stay well-hydrated. Drink enough fluid to keep your urine clear or pale yellow.  Try massaging, stretching, and relaxing the affected muscle. Do this for several minutes at a time.  For tight or tense muscles, use a warm towel, heating pad, or hot shower water directed to the affected area.  If you are sore or have pain after a cramp, applying ice to the affected area may relieve discomfort.  Put ice in a plastic bag.  Place a towel between your skin and the bag.  Leave the ice on for 20 minutes, 2-3 times per day.  Avoid strenuous exercise for several days if you have been having frequent leg cramps.  Make sure that your diet includes the essential minerals for your muscles to work normally.  Take medicines only as directed by your health care provider. SEEK MEDICAL CARE IF:  Your leg cramps get more severe or more frequent, or they do not improve over time.  Your foot becomes cold, numb, or blue.   This information is not intended to replace advice given to you by your health care provider. Make sure you discuss any questions you have with your health  care provider.   Document Released: 03/04/2004 Document Revised: 06/11/2014 Document Reviewed: 01/02/2014 Elsevier Interactive Patient Education 2016 [place] is the time when your body begins to move into the menopause (no menstrual period for 12 straight months). It is a natural process. Perimenopause can begin 2-8 years before the menopause and usually lasts for 1 year after the menopause. During this time, your ovaries may or may not produce an egg. The ovaries vary in their production of estrogen and progesterone hormones each month. This can cause irregular menstrual periods, difficulty getting pregnant, vaginal bleeding between periods, and uncomfortable symptoms. CAUSES  Irregular production of the ovarian hormones, estrogen and progesterone, and not ovulating every month.  Other causes include:  Tumor of the pituitary gland in the brain.  Medical disease that affects the ovaries.  Radiation treatment.  Chemotherapy.  Unknown causes.  Heavy smoking and excessive alcohol intake can bring on perimenopause sooner. SIGNS AND SYMPTOMS   Hot flashes.  Night sweats.  Irregular menstrual periods.  Decreased sex drive.  Vaginal dryness.  Headaches.  Mood swings.  Depression.  Memory problems.  Irritability.  Tiredness.  Weight gain.  Trouble getting pregnant.  The beginning of losing bone cells (osteoporosis).  The beginning of hardening of the arteries (atherosclerosis). DIAGNOSIS  Your health care provider will make a diagnosis by analyzing your age, menstrual history, and symptoms. He or she will do a physical exam and note any changes in your body, especially your female organs. Female hormone tests may or may not be helpful depending on the amount of female hormones you produce and when you produce them. However, other hormone tests may be helpful to rule out other problems. TREATMENT  In some cases, no treatment is  needed. The decision on whether treatment is necessary during the perimenopause should be made by you and your health care provider based on how the symptoms are affecting you and your lifestyle. Various treatments are available, such as:  Treating individual symptoms with a specific medicine for that symptom.  Herbal medicines that can help specific symptoms.  Counseling.  Group therapy. HOME CARE INSTRUCTIONS   Keep track of your menstrual periods (when they occur, how heavy they are, how long between periods, and how long they last) as well as your symptoms and when they started.  Only take over-the-counter or prescription medicines as directed by your health care provider.  Sleep and rest.  Exercise.  Eat a diet that contains calcium (good for your bones) and soy (acts like the estrogen hormone).  Do not smoke.  Avoid alcoholic beverages.  Take vitamin supplements as recommended by your health care provider. Taking vitamin E may help in certain cases.  Take calcium and vitamin D supplements to help prevent bone loss.  Group therapy is sometimes helpful.  Acupuncture may help in some cases. SEEK MEDICAL CARE IF:   You have questions about any symptoms you are having.  You need a referral to a specialist (gynecologist, psychiatrist, or psychologist). SEEK IMMEDIATE MEDICAL CARE IF:   You have vaginal bleeding.  Your period lasts longer than 8 days.  Your periods are recurring sooner than 21 days.  You have bleeding after intercourse.  You have severe depression.  You have pain when you urinate.  You have severe headaches.  You have vision problems.   This information is not intended to replace advice given to you by your health care provider. Make sure you discuss any questions you have with your health care provider.   Document Released: 03/04/2004 Document Revised: 02/15/2014 Document Reviewed: 08/24/2012 Elsevier Interactive Patient Education [employer]

## 2015-04-01 NOTE — Progress Notes (Signed)
55 y.o. [id number] Divorced  Caucasian Fe here for annual exam. Periods normal but light and no menses since 11/16 which was normal. Feels like period might start monthly but no bleeding. Occasional urgency to urinate..Some vaginal dryness with some discomfort with dryness. No partner change. Sees Neurologist Dr. Felecia Shelling for MS management. Sees PCP Dr. Nancy Fetter for anxiety/depression medication and Vitamin D management/llabs and aex. No change in medication. MRI  1/17 showed no change in brain with MS. No HSV outbreaks. No other health issues today.  Patient's last menstrual period was 12/30/2014.          Sexually active: Yes.    The current method of family planning is condoms use all the time & partner had vasectomy.    Exercising: No.  exercise Smoker:  no  Health Maintenance: Pap:  03-28-14 neg HPV HR neg MMG:  8/16 normal Colonoscopy: 2011 polyps due 2016 she had to cancelled patient is to schedule BMD:   none TDaP:  2012 Shingles: not done but had shingles Pneumonia: no Hep C and HIV: not done Labs: poct urine-neg Self breast exam:  Done monthly   reports that she has never smoked. She does not have any smokeless tobacco history on file. She reports that she drinks alcohol. She reports that she does not use illicit drugs.  Past Medical History  Diagnosis Date  . Multiple sclerosis ([place])     age 55  . 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 Shingles   . Memory loss   . Neuropathy ([place])   . Vision abnormalities     Past Surgical History  Procedure Laterality Date  . Cesarean section      Current Outpatient Prescriptions  Medication Sig Dispense Refill  . amphetamine-dextroamphetamine (ADDERALL XR) 20 MG 24 hr capsule Take 1 capsule (20 mg total) by mouth daily. 30 capsule 0  . calcium carbonate 200 MG capsule Take 250 mg by mouth.    . Cholecalciferol (VITAMIN D3) 400 UNITS CAPS Take 1,000 Units by mouth.    . cyclobenzaprine (FLEXERIL) 10 MG tablet Take 1 tablet  (10 mg total) by mouth at bedtime as needed. 30 tablet 11  . escitalopram (LEXAPRO) 20 MG tablet Take 1 tablet (20 mg total) by mouth daily. 30 tablet 3  . Fingolimod HCl 0.5 MG CAPS Take 1 capsule (0.5 mg total) by mouth daily. 90 capsule 0  . IRON PO Take by mouth. occ    . meloxicam (MOBIC) 7.5 MG tablet Take 1 tablet (7.5 mg total) by mouth daily. 30 tablet 11  . traZODone (DESYREL) 100 MG tablet Take 1 tablet (100 mg total) by mouth at bedtime. 30 tablet 11  . valACYclovir (VALTREX) 1000 MG tablet Take 0.5 tablets (500 mg total) by mouth daily. 30 tablet 12  . vitamin B-12 (CYANOCOBALAMIN) 1000 MCG tablet Take 1,000 mcg by mouth.     No current facility-administered medications for this visit.   Facility-Administered Medications Ordered in Other Visits  Medication Dose Route Frequency Provider Last Rate Last Dose  . gadopentetate dimeglumine (MAGNEVIST) injection 20 mL  20 mL Intravenous Once PRN Britt Bottom, MD        Family History  Problem Relation Age of Onset  . Osteoporosis Mother   . Heart disease Mother   . Stroke Mother   . COPD Mother   . Cancer Father     colon  . Heart disease Brother   . Heart attack Brother  ROS:  Pertinent items are noted in HPI.  Otherwise, a comprehensive ROS was negative.  Exam:   BP 116/70 mmHg  Pulse 74  Resp 16  Ht 5' 3.75" (1.619 m)  Wt 230 lb (104.327 kg)  BMI 39.80 kg/m2  LMP 12/30/2014 Height: 5' 3.75" (161.9 cm) Ht Readings from Last 3 Encounters:  04/01/15 5' 3.75" (1.619 m)  01/08/15 5' 3.75" (1.619 m)  04/24/14 5' 3.75" (1.619 m)    General appearance: alert, cooperative and appears stated age Head: Normocephalic, without obvious abnormality, atraumatic Neck: no adenopathy, supple, symmetrical, trachea midline and thyroid normal to inspection and palpation Lungs: clear to auscultation bilaterally Breasts: normal appearance, no masses or tenderness, No nipple retraction or dimpling, No nipple discharge or  bleeding, No axillary or supraclavicular adenopathy Heart: regular rate and rhythm Abdomen: soft, non-tender; no masses,  no organomegaly Extremities: extremities normal, atraumatic, no cyanosis or edema Skin: Skin color, texture, turgor normal. No rashes or lesions Lymph nodes: Cervical, supraclavicular, and axillary nodes normal. No abnormal inguinal nodes palpated Neurologic: Grossly normal   Pelvic: External genitalia:  no lesions              Urethra:  normal appearing urethra with no masses, tenderness or lesions              Bartholin's and Skene's: normal                 Vagina: normal appearing vagina with normal color and discharge, no lesions              Cervix: no cervical motion tenderness, no lesions and normal              Pap taken: No. Bimanual Exam:  Uterus:  normal size, contour, position, consistency, mobility, non-tender              Adnexa: normal adnexa and no mass, fullness, tenderness               Rectovaginal: Confirms               Anus:  normal sphincter tone, no lesions  Chaperone present: yes  A:  Well Woman with normal exam  Perimenopausal  with amenorrhea  Screening labs  Medication management of Anxiety/MS with Neurology  P:   Reviewed health and wellness pertinent to exam  Discussed etiology of menopause and cycle expectations. Given menses calendar to record with normal and abnormal parameters. Discussed need to evaluate today, due to amenorrhea. Agreeable. May need Provera challenge, discussed. Questions addressed.  Lab; FSH,TSH,Prolactin Hcg qual.  Screening labs: HIV, Hep C  Continue follow up with PCP as indicated  Pap smear as above not taken   counseled on breast self exam, mammography screening, STD prevention, HIV risk factors and prevention, adequate intake of calcium and vitamin D, diet and exercise  return annually or prn  An After Visit Summary was printed and given to the patient.

## 2015-04-02 ENCOUNTER — Telehealth: Payer: Self-pay

## 2015-04-02 ENCOUNTER — Other Ambulatory Visit: Payer: Self-pay | Admitting: Certified Nurse Midwife

## 2015-04-02 DIAGNOSIS — N951 Menopausal and female climacteric states: Secondary | ICD-10-CM

## 2015-04-02 DIAGNOSIS — N912 Amenorrhea, unspecified: Secondary | ICD-10-CM

## 2015-04-02 LAB — HIV ANTIBODY (ROUTINE TESTING W REFLEX): HIV: NONREACTIVE

## 2015-04-02 LAB — FOLLICLE STIMULATING HORMONE: FSH: 68.5 m[IU]/mL

## 2015-04-02 LAB — HEPATITIS C ANTIBODY: HCV AB: NEGATIVE

## 2015-04-02 LAB — PROLACTIN: PROLACTIN: 5 ng/mL

## 2015-04-02 LAB — HCG, SERUM, QUALITATIVE: PREG SERUM: NEGATIVE

## 2015-04-02 MED ORDER — MEDROXYPROGESTERONE ACETATE 10 MG PO TABS: 10.0000 mg | ORAL_TABLET | Freq: Every day | ORAL | Status: DC

## 2015-04-02 NOTE — Telephone Encounter (Signed)
Patient notified of results. See lab 

## 2015-04-02 NOTE — Telephone Encounter (Signed)
-----   Message from Regina Eck, CNM sent at 04/02/2015  7:45 AM EST ----- Notify patient that HIV, Hep C are negative TSH, Prolactin are normal Preg test negative FSH indicates menopausal range. Patient needs Provera challenge as discussed Order in for Provera 10 mg x 10 days. Patient needs to call after completes and advise if had bleeding or no bleeding

## 2015-04-02 NOTE — Telephone Encounter (Signed)
lmtcb

## 2015-04-02 NOTE — Telephone Encounter (Signed)
Patient returning your call.

## 2015-04-04 ENCOUNTER — Telehealth: Payer: Self-pay | Admitting: Neurology

## 2015-04-04 MED ORDER — AMPHETAMINE-DEXTROAMPHET ER 20 MG PO CP24: 20.0000 mg | ORAL_CAPSULE | Freq: Every day | ORAL | Status: DC

## 2015-04-04 NOTE — Telephone Encounter (Signed)
Adderall rx. up front [facility] 

## 2015-04-04 NOTE — Telephone Encounter (Signed)
Patient is calling to get a written Rx for amphetamine-dextroamphetamine (ADDERALL XR) 20 MG 24 hr capsule. I advised the Rx will be ready in 24 hrs(Monday) unless the nurse advises otherwise.

## 2015-04-04 NOTE — Progress Notes (Signed)
Encounter reviewed Alayzia Pavlock, MD   

## 2015-05-02 ENCOUNTER — Other Ambulatory Visit: Payer: Self-pay | Admitting: Certified Nurse Midwife

## 2015-05-05 NOTE — Telephone Encounter (Signed)
Medication refill request: Valtrex 1000 mg  Last AEX:  04/01/2015 with DL Next AEX: 04/06/2016 with DL  Last MMG (if hormonal medication request): N/A Refill authorized: #30/12 rfs?  Routed to Dr. Talbert Nan since DL and PG are out of the office.

## 2015-05-08 ENCOUNTER — Encounter: Payer: Self-pay | Admitting: Neurology

## 2015-05-08 ENCOUNTER — Ambulatory Visit (INDEPENDENT_AMBULATORY_CARE_PROVIDER_SITE_OTHER): Payer: BC Managed Care – PPO | Admitting: Neurology

## 2015-05-08 VITALS — BP 146/88 | HR 76 | Resp 14 | Ht 63.75 in | Wt 233.0 lb

## 2015-05-08 DIAGNOSIS — F909 Attention-deficit hyperactivity disorder, unspecified type: Secondary | ICD-10-CM

## 2015-05-08 DIAGNOSIS — R5383 Other fatigue: Secondary | ICD-10-CM

## 2015-05-08 DIAGNOSIS — R269 Unspecified abnormalities of gait and mobility: Secondary | ICD-10-CM

## 2015-05-08 DIAGNOSIS — F418 Other specified anxiety disorders: Secondary | ICD-10-CM

## 2015-05-08 DIAGNOSIS — F988 Other specified behavioral and emotional disorders with onset usually occurring in childhood and adolescence: Secondary | ICD-10-CM

## 2015-05-08 DIAGNOSIS — G35 Multiple sclerosis: Secondary | ICD-10-CM | POA: Diagnosis not present

## 2015-05-08 MED ORDER — AMPHETAMINE-DEXTROAMPHET ER 30 MG PO CP24: 30.0000 mg | ORAL_CAPSULE | Freq: Every day | ORAL | Status: DC

## 2015-05-08 NOTE — Progress Notes (Signed)
[facility]  PATIENT: Raven Montoya DOB: 10-Sep-1960  REFERRING CLINICIAN: Anastasia Pall HISTORY FROM: Patient REASON FOR VISIT: MS   HISTORICAL  CHIEF COMPLAINT:  Chief Complaint  Patient presents with  . Multiple Sclerosis    Sts. she continues to tolerate Gilenya well.  Thinks legs are weaker over the last couple of mos., and worse when she is fatigued.  Sts. she sleeps better with prn Trazodone./fim    HISTORY OF PRESENT ILLNESS:  Raven Montoya is a 55 year old woman with MS.  She denies any exacerbation recently but feels more weak in her legs.   She is finding it harder to work a full shift.    She is on Gilenya now for about 2 years. She tolerates it well. LFTs were mildly elevated once but were fine 4 months ago.  Her last MRI 02/12/2015 was unchanged compared to 11/17/2013 MRI (around time she started Gilenya)  Gait/strength/sensation:    Gait is good most days with occasioanl stumbles.   She fell once last year.      There is no weakness in the arms or legs.    She reports mild tingling  In legs > arms, often after laying down a while. She does not have any when active.  This also occurs if she has been driving for long time.  There is no painful dysesthesias.   Bladder/bowel:   She denies any significant issues with these.  Vision:   She denies any MS related vision problem.   She wears reading glasses.    Fatigue/Sleep:   Her fatigue is cognitive greater than physical. 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     Adderall XR 20 mg daily helps her focus better.  Cognition is better than  it used to.    She had mild sleep onset insomnia some nights but is better on 1/2 of a trazodone.     A PSG in the past did not show OSA (she snores)   Occasional sleepiness is better on Adderall  Mood:   She notes some depression and mild anxiety.   Her mother lives with her and she is a very depressive person.  Cognition:    She has decreased  attention and focus but Does better with the Adderall.  Neck pain: She reports axial pain in the neck that increases with movements. Pain is crampy. There is no radiating pain down the arm.  MS History:  She was diagnosed with multiple sclerosis at age 81 after presenting with optic neuritis in the left eye. Her neurologist discussed with her that she probably had MS but no medication was started at that time. Vision got better over the next few weeks. A couple years later, she went to see Dr. Effie Shy. He felt that the MS should be treated and she was started on Rebif. She remain on Rebif for many years   She had not had any major exacerbations while on Rebif but had needles fatigue.    In 2014, she switched to [place]. She tolerates Gilenya well.   REVIEW OF SYSTEMS:  Constitutional: No fevers, chills, sweats, or change in appetite.   She has fatigue and mild insomnia Eyes: No visual changes, double vision, eye pain Ear, nose and throat: No hearing loss, ear pain, nasal congestion, sore throat Cardiovascular: No chest pain, palpitations Respiratory:  No shortness of breath at rest or with exertion.   No wheezes GastrointestinaI: No nausea, vomiting,  diarrhea, abdominal pain, fecal incontinence Genitourinary:  No dysuria, urinary retention or frequency.  No nocturia. Musculoskeletal:  Mild neck pain, no major back pain Integumentary: No rash, pruritus, skin lesions Neurological: as above Psychiatric: as above.    Endocrine: No palpitations, diaphoresis, change in appetite, change in weigh or increased thirst Hematologic/Lymphatic:  No anemia, purpura, petechiae. Allergic/Immunologic: No itchy/runny eyes, nasal congestion, recent allergic reactions, rashes  ALLERGIES: No Known Allergies  HOME MEDICATIONS: Outpatient Prescriptions Prior to Visit  Medication Sig Dispense Refill  . amphetamine-dextroamphetamine (ADDERALL XR) 20 MG 24 hr capsule Take 1 capsule (20 mg total) by mouth  daily. 30 capsule 0  . calcium carbonate 200 MG capsule Take 250 mg by mouth.    . Cholecalciferol (VITAMIN D3) 400 UNITS CAPS Take 1,000 Units by mouth.    . cyclobenzaprine (FLEXERIL) 10 MG tablet Take 1 tablet (10 mg total) by mouth at bedtime as needed. 30 tablet 11  . escitalopram (LEXAPRO) 20 MG tablet Take 1 tablet (20 mg total) by mouth daily. 30 tablet 3  . Fingolimod HCl 0.5 MG CAPS Take 1 capsule (0.5 mg total) by mouth daily. 90 capsule 0  . IRON PO Take by mouth. occ    . medroxyPROGESTERone (PROVERA) 10 MG tablet Take 1 tablet (10 mg total) by mouth daily. 10 tablet 0  . meloxicam (MOBIC) 7.5 MG tablet Take 1 tablet (7.5 mg total) by mouth daily. 30 tablet 11  . traZODone (DESYREL) 100 MG tablet Take 1 tablet (100 mg total) by mouth at bedtime. 30 tablet 11  . valACYclovir (VALTREX) 1000 MG tablet TAKE 1/2 TABLET BY MOUTH EVERY DAY 30 tablet 12  . vitamin B-12 (CYANOCOBALAMIN) 1000 MCG tablet Take 1,000 mcg by mouth.     Facility-Administered Medications Prior to Visit  Medication Dose Route Frequency Provider Last Rate Last Dose  . gadopentetate dimeglumine (MAGNEVIST) injection 20 mL  20 mL Intravenous Once PRN Britt Bottom, MD        PAST MEDICAL HISTORY: Past Medical History  Diagnosis Date  . Multiple sclerosis ([place])     age 71  . 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 Shingles   . Memory loss   . Neuropathy ([place])   . Vision abnormalities     PAST SURGICAL HISTORY: Past Surgical History  Procedure Laterality Date  . Cesarean section      FAMILY HISTORY: Family History  Problem Relation Age of Onset  . Osteoporosis Mother   . Heart disease Mother   . Stroke Mother   . COPD Mother   . Cancer Father     colon  . Heart disease Brother   . Heart attack Brother     SOCIAL HISTORY:  Social History   Social History  . Marital Status: Divorced    Spouse Name: N/A  . Number of Children: N/A  . Years of Education: N/A    Occupational History  . Not on file.   Social History Main Topics  . Smoking status: Never Smoker   . Smokeless tobacco: Not on file  . Alcohol Use: 0.0 oz/week    0 Standard drinks or equivalent per week     Comment: rare  . Drug Use: No  . Sexual Activity:    Partners: Male    Birth Control/ Protection: Condom     Comment: partner has vasectomy   Other Topics Concern  . Not on file   Social History Narrative  PHYSICAL EXAM  Filed Vitals:   05/08/15 0937  BP: 146/88  Pulse: 76  Resp: 14  Height: 5' 3.75" (1.619 m)  Weight: 233 lb (105.688 kg)    Body mass index is 40.32 kg/(m^2).   General: The patient is well-developed and well-nourished and in no acute distress  Neurologic Exam  Mental status: The patient is alert and oriented x 3 at the time of the examination. The patient has apparent normal recent and remote memory, with an apparently normal attention span and concentration ability.   Speech is normal.  Cranial nerves: Extraocular movements are full. Facial strength is normal.  Trapezius and sternocleidomastoid strength is normal. No dysarthria is noted.  The tongue is midline, and the patient has symmetric elevation of the soft palate. No obvious hearing deficits are noted.  Motor:  Muscle bulk and tone are normal. Strength is  5 / 5 in all 4 extremities.   Sensory: Sensory testing is intact to pinprick, soft touch, vibration sensation, and position sense on all 4 extremities.  Coordination: Cerebellar testing reveals good finger-nose-finger and heel-to-shin bilaterally.  Gait and station: Station and gait are normal. Tandem gait is wide. Romberg is negative.   Reflexes: Deep tendon reflexes are symmetric and brisk bilaterally. Marland Kitchen    DIAGNOSTIC DATA (LABS, IMAGING, TESTING) - I reviewed patient records, labs, notes, testing and imaging myself where available.  Lab Results  Component Value Date   WBC 3.8 01/08/2015   HGB 13.6 04/24/2014   HCT  38.4 01/08/2015   MCV 85 01/08/2015   PLT 264 01/08/2015      Component Value Date/Time   NA 137 10/17/2012 1146   K 4.2 10/17/2012 1146   CL 106 10/17/2012 1146   CO2 29 10/17/2012 1146   GLUCOSE 78 10/17/2012 1146   BUN 17 10/17/2012 1146   CREATININE 0.73 10/17/2012 1146   CALCIUM 9.0 10/17/2012 1146   PROT 6.5 01/08/2015 1029   PROT 6.7 10/17/2012 1146   ALBUMIN 4.3 01/08/2015 1029   ALBUMIN 4.2 10/17/2012 1146   AST 19 01/08/2015 1029   ALT 25 01/08/2015 1029   ALKPHOS 95 01/08/2015 1029   BILITOT 0.6 01/08/2015 1029   BILITOT 0.6 10/17/2012 1146   Lab Results  Component Value Date   CHOL 161 10/17/2012   HDL 48 10/17/2012   LDLCALC 66 10/17/2012   TRIG 236* 10/17/2012   CHOLHDL 3.4 10/17/2012   Lab Results  Component Value Date   HGBA1C 5.4 10/17/2012   No results found for: VITAMINB12 Lab Results  Component Value Date   TSH 1.71 04/01/2015      ASSESSMENT AND PLAN Multiple sclerosis ([place])  Depression with anxiety  Gait disturbance  Other fatigue  Attention deficit disorder    1.  Continue Gilenya. Since she has frequent oral herpes outbreak, she will take valtrex. 2.  She is advised to exercise as tolerated and remain active. 3.,   Increase Adderall to 30 mg XR daily.   She will come back to see [place] in about 5 months or call sooner if she has new or worsening neurologic symptoms.   Steaven Wholey A. Felecia Shelling, MD, PhD [id number], [id number] AM Certified in Neurology, Clinical Neurophysiology, Sleep Medicine, Pain Medicine and Neuroimaging  [facility] [address], [place], [place] [address] [phone] o

## 2015-05-19 ENCOUNTER — Encounter: Payer: Self-pay | Admitting: *Deleted

## 2015-06-19 ENCOUNTER — Telehealth: Payer: Self-pay | Admitting: Neurology

## 2015-06-19 MED ORDER — AMPHETAMINE-DEXTROAMPHET ER 30 MG PO CP24: 30.0000 mg | ORAL_CAPSULE | Freq: Every day | ORAL | Status: DC

## 2015-06-19 NOTE — Telephone Encounter (Signed)
Message For: OFFICE               Taken 11-MAY-17 at  9:38AM by BEF ------------------------------------------------------------  Raven Montoya               CID  [id number]   Patient  SAME                  Pt's Dr  Felecia Shelling         Area Code  336  Phone#  [facility]                                                                           Disp:Y/N  N  If Y = C/B If No Response In 76minutes

## 2015-06-19 NOTE — Telephone Encounter (Signed)
Adderall rx. up front [facility] 

## 2015-06-19 NOTE — Telephone Encounter (Signed)
I have spoken with Bill and confirmed she needs r/f of Adderall.  Rx. awaiting RAS sig/fim

## 2015-07-30 ENCOUNTER — Telehealth: Payer: Self-pay | Admitting: Neurology

## 2015-07-30 MED ORDER — AMPHETAMINE-DEXTROAMPHET ER 30 MG PO CP24: 30.0000 mg | ORAL_CAPSULE | Freq: Every day | ORAL | Status: DC

## 2015-07-30 NOTE — Telephone Encounter (Signed)
Rx. awaiting RAS sig/fim 

## 2015-07-30 NOTE — Telephone Encounter (Signed)
Patient called to request refill of amphetamine-dextroamphetamine (ADDERALL XR) 30 MG 24 hr capsule

## 2015-07-30 NOTE — Telephone Encounter (Signed)
Adderall rx. up front [facility] 

## 2015-09-08 ENCOUNTER — Other Ambulatory Visit: Payer: Self-pay | Admitting: Neurology

## 2015-09-08 MED ORDER — AMPHETAMINE-DEXTROAMPHET ER 30 MG PO CP24: 30.0000 mg | ORAL_CAPSULE | Freq: Every day | ORAL | 0 refills | Status: DC

## 2015-09-08 NOTE — Telephone Encounter (Signed)
Printed,signed and placed up front for pick up

## 2015-09-08 NOTE — Telephone Encounter (Signed)
Patient requesting refill of amphetamine-dextroamphetamine (ADDERALL XR) 30 MG 24 hr capsule. ° ° °

## 2015-09-30 NOTE — Telephone Encounter (Signed)
error 

## 2015-10-06 ENCOUNTER — Telehealth: Payer: Self-pay | Admitting: Neurology

## 2015-10-06 MED ORDER — AMPHETAMINE-DEXTROAMPHET ER 30 MG PO CP24: 30.0000 mg | ORAL_CAPSULE | Freq: Every day | ORAL | 0 refills | Status: DC

## 2015-10-06 NOTE — Telephone Encounter (Signed)
Patient requesting refill of amphetamine-dextroamphetamine (ADDERALL XR) 30 MG 24 hr capsule. I advised the Rx will be ready in 24 hours unless the nurse advises otherwise.

## 2015-10-06 NOTE — Telephone Encounter (Signed)
Rx. awaiting RAS sig/fim 

## 2015-10-06 NOTE — Addendum Note (Signed)
Addended by: France Ravens I on: 10/06/2015 01:08 PM   Modules accepted: Orders

## 2015-10-06 NOTE — Telephone Encounter (Signed)
Adderall rx. up front [facility] 

## 2015-10-08 ENCOUNTER — Ambulatory Visit: Payer: BC Managed Care – PPO | Admitting: Neurology

## 2015-10-15 ENCOUNTER — Encounter: Payer: Self-pay | Admitting: Neurology

## 2015-10-15 ENCOUNTER — Ambulatory Visit (INDEPENDENT_AMBULATORY_CARE_PROVIDER_SITE_OTHER): Payer: BC Managed Care – PPO | Admitting: Neurology

## 2015-10-15 VITALS — BP 146/78 | HR 88 | Resp 18 | Ht 63.75 in | Wt 232.0 lb

## 2015-10-15 DIAGNOSIS — G35 Multiple sclerosis: Secondary | ICD-10-CM

## 2015-10-15 DIAGNOSIS — F418 Other specified anxiety disorders: Secondary | ICD-10-CM

## 2015-10-15 DIAGNOSIS — M542 Cervicalgia: Secondary | ICD-10-CM

## 2015-10-15 DIAGNOSIS — R5383 Other fatigue: Secondary | ICD-10-CM

## 2015-10-15 DIAGNOSIS — F909 Attention-deficit hyperactivity disorder, unspecified type: Secondary | ICD-10-CM

## 2015-10-15 DIAGNOSIS — G35D Multiple sclerosis, unspecified: Secondary | ICD-10-CM

## 2015-10-15 DIAGNOSIS — R269 Unspecified abnormalities of gait and mobility: Secondary | ICD-10-CM

## 2015-10-15 DIAGNOSIS — F988 Other specified behavioral and emotional disorders with onset usually occurring in childhood and adolescence: Secondary | ICD-10-CM

## 2015-10-15 MED ORDER — SERTRALINE HCL 50 MG PO TABS: 50.0000 mg | ORAL_TABLET | Freq: Every day | ORAL | 11 refills | Status: DC

## 2015-10-15 MED ORDER — AMPHETAMINE-DEXTROAMPHET ER 30 MG PO CP24: 30.0000 mg | ORAL_CAPSULE | Freq: Every day | ORAL | 0 refills | Status: DC

## 2015-10-15 NOTE — Progress Notes (Signed)
[facility]  PATIENT: Raven Montoya DOB: [birth year]  REFERRING CLINICIAN: Anastasia Pall HISTORY FROM: Patient REASON FOR VISIT: MS   HISTORICAL  CHIEF COMPLAINT:  Chief Complaint  Patient presents with  . Multiple Sclerosis    Sts. she continues to tolerate Gilenya well.  Sts. she is tripping more--not sure why.  Doesn't think balance is any worse./fim    HISTORY OF PRESENT ILLNESS:  Mrs. Raven Montoya is a 55 year old woman with MS.  She denies any exacerbation or new MS symptoms.     She is on Gilenya now for about 2 years. She tolerates it well. LFTs were mildly elevated once but were fine 4 months ago.  Her last MRI 02/12/2015 was unchanged compared to 11/17/2013 MRI (around time she started Gilenya).      Gait/strength/sensation:    Gait is good most days with occasional  Stumbles and rare falls (trips over curb as example).      There is no weakness in the arms or legs.    She reports mild tingling  In legs, often after laying down a while. She does not have any when active.  This also occurs if she has been driving for long time.  There is no painful dysesthesias.   Bladder/bowel:   She denies any significant issues with these.  Vision:   She denies any MS related vision problem.   She wears reading glasses.    Fatigue/Sleep:   She notes cognitive greater than physical fatigue.   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     Adderall XR 30 mg daily helps her fatigue and focus.    She had mild sleep onset insomnia which is milder now and she did not feel trazodone helped much.  some nights but is better on 1/2 of a trazodone.     A PSG in the past did not show OSA (she snores).   Her mild  sleepiness is better on Adderall  Mood:   She notes some depression and anxiety.   Stress seems worseHer mother lives with her and she is a very depressive person.   She also has medical issues.     Cognition:    She notes her decreased attention and focus  improved with  Adderall.  Neck pain: She reports much more left >> right neck pain and stiffness.  Pain is axial.   Pain increases with movements, espexially turning. Pain is crampy. There is no radiating pain down the arm.   Meloxicam helps.    In the past, she has had trigger point injections with great benefit. She denies arm pain or weakness.  MS History:  She was diagnosed with multiple sclerosis at age 58 after presenting with optic neuritis in the left eye. Her neurologist discussed with her that she probably had MS but no medication was started at that time. Vision got better over the next few weeks. A couple years later, she went to see Dr. Effie Shy. He felt that the MS should be treated and she was started on Rebif. She remain on Rebif for many years   She had not had any major exacerbations while on Rebif but had needles fatigue.    In 2014, she switched to [place]. She tolerates Gilenya well.   REVIEW OF SYSTEMS:  Constitutional: No fevers, chills, sweats, or change in appetite.   She has fatigue and mild insomnia Eyes: No visual changes, double vision, eye pain Ear, nose  and throat: No hearing loss, ear pain, nasal congestion, sore throat Cardiovascular: No chest pain, palpitations Respiratory:  No shortness of breath at rest or with exertion.   No wheezes GastrointestinaI: No nausea, vomiting, diarrhea, abdominal pain, fecal incontinence Genitourinary:  No dysuria, urinary retention or frequency.  No nocturia. Musculoskeletal:  Mild neck pain, no major back pain Integumentary: No rash, pruritus, skin lesions Neurological: as above Psychiatric: as above.    Endocrine: No palpitations, diaphoresis, change in appetite, change in weigh or increased thirst Hematologic/Lymphatic:  No anemia, purpura, petechiae. Allergic/Immunologic: No itchy/runny eyes, nasal congestion, recent allergic reactions, rashes  ALLERGIES: No Known Allergies  HOME MEDICATIONS: Outpatient  Medications Prior to Visit  Medication Sig Dispense Refill  . calcium carbonate 200 MG capsule Take 250 mg by mouth.    . Cholecalciferol (VITAMIN D3) 400 UNITS CAPS Take 1,000 Units by mouth.    . cyclobenzaprine (FLEXERIL) 10 MG tablet Take 1 tablet (10 mg total) by mouth at bedtime as needed. 30 tablet 11  . Fingolimod HCl 0.5 MG CAPS Take 1 capsule (0.5 mg total) by mouth daily. 90 capsule 0  . IRON PO Take by mouth. occ    . meloxicam (MOBIC) 7.5 MG tablet Take 1 tablet (7.5 mg total) by mouth daily. 30 tablet 11  . valACYclovir (VALTREX) 1000 MG tablet TAKE 1/2 TABLET BY MOUTH EVERY DAY 30 tablet 12  . vitamin B-12 (CYANOCOBALAMIN) 1000 MCG tablet Take 1,000 mcg by mouth.    Marland Kitchen amphetamine-dextroamphetamine (ADDERALL XR) 30 MG 24 hr capsule Take 1 capsule (30 mg total) by mouth daily. 30 capsule 0  . medroxyPROGESTERone (PROVERA) 10 MG tablet Take 1 tablet (10 mg total) by mouth daily. (Patient not taking: Reported on 10/15/2015) 10 tablet 0  . traZODone (DESYREL) 100 MG tablet Take 1 tablet (100 mg total) by mouth at bedtime. (Patient not taking: Reported on 10/15/2015) 30 tablet 11   Facility-Administered Medications Prior to Visit  Medication Dose Route Frequency Provider Last Rate Last Dose  . gadopentetate dimeglumine (MAGNEVIST) injection 20 mL  20 mL Intravenous Once PRN Britt Bottom, MD        PAST MEDICAL HISTORY: Past Medical History:  Diagnosis Date  . Memory loss   . Multiple sclerosis ([place])    age 83  . Neuropathy ([place])   . Shingles   . 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 Vision abnormalities     PAST SURGICAL HISTORY: Past Surgical History:  Procedure Laterality Date  . CESAREAN SECTION      FAMILY HISTORY: Family History  Problem Relation Age of Onset  . Osteoporosis Mother   . Heart disease Mother   . Stroke Mother   . COPD Mother   . Cancer Father     colon  . Heart disease Brother   . Heart attack Brother     SOCIAL  HISTORY:  Social History   Social History  . Marital status: Divorced    Spouse name: N/A  . Number of children: N/A  . Years of education: N/A   Occupational History  . Not on file.   Social History Main Topics  . Smoking status: Never Smoker  . Smokeless tobacco: Not on file  . Alcohol use 0.0 oz/week     Comment: rare  . Drug use: No  . Sexual activity: Yes    Partners: Male    Birth control/ protection: Condom     Comment: partner has vasectomy  Other Topics Concern  . Not on file   Social History Narrative  . No narrative on file     PHYSICAL EXAM  Vitals:   10/15/15 0914  BP: (!) 146/78  Pulse: 88  Resp: 18  Weight: 232 lb (105.2 kg)  Height: 5' 3.75" (1.619 m)    Body mass index is 40.14 kg/m.   General: The patient is well-developed and well-nourished and in no acute distress  Musculoskeletal:   She has slightly reduced range of motion in the neck with rotation. There is tenderness primarily over the left trapezius muscle. Mild tenderness over the lower cervical paraspinal muscles  Neurologic Exam  Mental status: The patient is alert and oriented x 3 at the time of the examination. The patient has apparent normal recent and remote memory, with an apparently normal attention span and concentration ability.   Speech is normal.  Cranial nerves: Extraocular movements are full. Facial strength is normal.  Trapezius and sternocleidomastoid strength is normal. No dysarthria is noted.  The tongue is midline, and the patient has symmetric elevation of the soft palate. No obvious hearing deficits are noted.  Motor:  Muscle bulk and tone are normal. Strength is  5 / 5 in all 4 extremities.   Sensory: Sensory testing is intact to pinprick, soft touch, vibration sensation, and position sense on all 4 extremities.  Coordination: Cerebellar testing reveals good finger-nose-finger and heel-to-shin bilaterally.  Gait and station: Station and gait are normal.  Tandem gait is wide. Romberg is negative.   Reflexes: Deep tendon reflexes are symmetric and brisk bilaterally. Marland Kitchen    DIAGNOSTIC DATA (LABS, IMAGING, TESTING) - I reviewed patient records, labs, notes, testing and imaging myself where available.  Lab Results  Component Value Date   WBC 3.8 01/08/2015   HGB 13.6 04/24/2014   HCT 38.4 01/08/2015   MCV 85 01/08/2015   PLT 264 01/08/2015      Component Value Date/Time   NA 137 10/17/2012 1146   K 4.2 10/17/2012 1146   CL 106 10/17/2012 1146   CO2 29 10/17/2012 1146   GLUCOSE 78 10/17/2012 1146   BUN 17 10/17/2012 1146   CREATININE 0.73 10/17/2012 1146   CALCIUM 9.0 10/17/2012 1146   PROT 6.5 01/08/2015 1029   ALBUMIN 4.3 01/08/2015 1029   AST 19 01/08/2015 1029   ALT 25 01/08/2015 1029   ALKPHOS 95 01/08/2015 1029   BILITOT 0.6 01/08/2015 1029   Lab Results  Component Value Date   CHOL 161 10/17/2012   HDL 48 10/17/2012   LDLCALC 66 10/17/2012   TRIG 236 (H) 10/17/2012   CHOLHDL 3.4 10/17/2012   Lab Results  Component Value Date   HGBA1C 5.4 10/17/2012   No results found for: VITAMINB12 Lab Results  Component Value Date   TSH 1.71 04/01/2015      ASSESSMENT AND PLAN Multiple sclerosis ([facility])  Other fatigue  Attention deficit disorder  Depression with anxiety  Gait disturbance  Neck pain   1.  Continue Gilenya.  2.  Exercise as tolerated and remain active. 3.  TPI left trapezius and left C6C7 paraspinal muscle with 80 mg Depo-Medrol in [place]. 4.   Adderall to 30 mg XR daily.  5.   Zoloft 50 mg for anxiety She will come back to see [place] in about 5 months or call sooner if she has new or worsening neurologic symptoms.   [place] A. Felecia Shelling, MD, PhD [id number], [id number] AM Certified in Neurology, Clinical Neurophysiology, Sleep Medicine, Pain  Medicine and Neuroimaging  [facility] [address] [place], [place] [address] [phone] o

## 2015-10-16 ENCOUNTER — Other Ambulatory Visit: Payer: Self-pay | Admitting: Neurology

## 2015-11-10 ENCOUNTER — Encounter: Payer: Self-pay | Admitting: *Deleted

## 2015-11-10 ENCOUNTER — Telehealth: Payer: Self-pay | Admitting: Neurology

## 2015-11-10 NOTE — Telephone Encounter (Signed)
Letter up front [facility]/fim 

## 2015-11-10 NOTE — Telephone Encounter (Signed)
I have spoken with [name] today.  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  She is taking Adderall to treat MS related fatigue.  Letter confirming she is taking Adderall and this should not adversely affect her driving a school bus printed, awaiting RAS sig.  Raven Montoya will pick it up in the office tomorrow./fim

## 2015-11-10 NOTE — Telephone Encounter (Signed)
[place].  I need more details. Not sure what this is about?

## 2015-11-10 NOTE — Telephone Encounter (Signed)
Patient adds that if [phone] is incorrect try [phone].

## 2015-11-10 NOTE — Telephone Encounter (Signed)
Patient called to ask Dr. Felecia Shelling or nurse Faith to contact Dr. Franne Forts [phone] to okay for her to drive.

## 2015-11-25 DIAGNOSIS — IMO0001 Reserved for inherently not codable concepts without codable children: Secondary | ICD-10-CM | POA: Insufficient documentation

## 2015-11-28 ENCOUNTER — Telehealth: Payer: Self-pay | Admitting: Neurology

## 2015-11-28 MED ORDER — AMPHETAMINE-DEXTROAMPHET ER 30 MG PO CP24: 30.0000 mg | ORAL_CAPSULE | Freq: Every day | ORAL | 0 refills | Status: DC

## 2015-11-28 NOTE — Telephone Encounter (Signed)
Prescription at front desk for pick up. 

## 2015-11-28 NOTE — Telephone Encounter (Signed)
Patient requesting refill of amphetamine-dextroamphetamine (ADDERALL XR) 30 MG 24 hr capsule Pharmacy: pick up

## 2015-12-02 ENCOUNTER — Other Ambulatory Visit: Payer: Self-pay | Admitting: Neurology

## 2016-01-02 ENCOUNTER — Emergency Department (HOSPITAL_COMMUNITY): Payer: BC Managed Care – PPO

## 2016-01-02 ENCOUNTER — Emergency Department (HOSPITAL_COMMUNITY)
Admission: EM | Admit: 2016-01-02 | Discharge: 2016-01-03 | Disposition: A | Discharge status: Home / Self Care | Payer: BC Managed Care – PPO | Attending: Emergency Medicine | Admitting: Emergency Medicine

## 2016-01-02 ENCOUNTER — Encounter (HOSPITAL_COMMUNITY): Payer: Self-pay | Admitting: Emergency Medicine

## 2016-01-02 DIAGNOSIS — Z79899 Other long term (current) drug therapy: Secondary | ICD-10-CM | POA: Insufficient documentation

## 2016-01-02 DIAGNOSIS — R1031 Right lower quadrant pain: Secondary | ICD-10-CM | POA: Diagnosis not present

## 2016-01-02 LAB — CBC
HEMATOCRIT: 41.2 % (ref 36.0–46.0)
HEMOGLOBIN: 13.8 g/dL (ref 12.0–15.0)
MCH: 31.4 pg (ref 26.0–34.0)
MCHC: 33.5 g/dL (ref 30.0–36.0)
MCV: 93.6 fL (ref 78.0–100.0)
Platelets: 238 10*3/uL (ref 150–400)
RBC: 4.4 MIL/uL (ref 3.87–5.11)
RDW: 14.4 % (ref 11.5–15.5)
WBC: 5.8 10*3/uL (ref 4.0–10.5)

## 2016-01-02 LAB — COMPREHENSIVE METABOLIC PANEL
ALBUMIN: 4.3 g/dL (ref 3.5–5.0)
ALT: 66 U/L — ABNORMAL HIGH (ref 14–54)
ANION GAP: 9 (ref 5–15)
AST: 39 U/L (ref 15–41)
Alkaline Phosphatase: 99 U/L (ref 38–126)
BUN: 20 mg/dL (ref 6–20)
CHLORIDE: 102 mmol/L (ref 101–111)
CO2: 27 mmol/L (ref 22–32)
Calcium: 9.1 mg/dL (ref 8.9–10.3)
Creatinine, Ser: 0.77 mg/dL (ref 0.44–1.00)
GFR calc non Af Amer: >60 mL/min (ref >60)
GLUCOSE: 83 mg/dL (ref 65–99)
Potassium: 3.6 mmol/L (ref 3.5–5.1)
SODIUM: 138 mmol/L (ref 135–145)
Total Bilirubin: 0.9 mg/dL (ref 0.3–1.2)
Total Protein: 7.1 g/dL (ref 6.5–8.1)

## 2016-01-02 LAB — URINALYSIS, ROUTINE W REFLEX MICROSCOPIC
Bilirubin Urine: NEGATIVE
GLUCOSE, UA: NEGATIVE mg/dL
HGB URINE DIPSTICK: NEGATIVE
Ketones, ur: NEGATIVE mg/dL
Leukocytes, UA: NEGATIVE
Nitrite: NEGATIVE
PH: 5.5 (ref 5.0–8.0)
Protein, ur: NEGATIVE mg/dL
SPECIFIC GRAVITY, URINE: 1.016 (ref 1.005–1.030)

## 2016-01-02 LAB — LIPASE, BLOOD: LIPASE: 164 U/L — AB (ref 11–51)

## 2016-01-02 MED ORDER — SODIUM CHLORIDE 0.9 % IJ SOLN
INTRAMUSCULAR | Status: AC
  Filled 2016-01-02: qty 50

## 2016-01-02 MED ORDER — IOPAMIDOL (ISOVUE-300) INJECTION 61%
100.0000 mL | Freq: Once | INTRAVENOUS | Status: AC | PRN
  Administered 2016-01-02: 100 mL via INTRAVENOUS

## 2016-01-02 MED ORDER — IOPAMIDOL (ISOVUE-300) INJECTION 61%
INTRAVENOUS | Status: AC
  Filled 2016-01-02: qty 100

## 2016-01-02 NOTE — ED Provider Notes (Signed)
[facility] DEPT Provider Note   CSN: [id number] Arrival date & time: 01/02/16  2050     History   Chief Complaint Chief Complaint  Patient presents with  . Abdominal Pain    HPI Raven Montoya is a 55 y.o. female.  The history is provided by the patient.  Abdominal Pain   This is a new problem. The current episode started yesterday. The problem occurs constantly (fluctuating). The problem has been gradually worsening. The pain is associated with eating. The pain is located in the RLQ. The pain is moderate. Pertinent negatives include fever, diarrhea, hematochezia, melena, nausea, vomiting, dysuria and hematuria. The symptoms are aggravated by palpation. Nothing relieves the symptoms. Past workup includes surgery (c-section).    Past Medical History:  Diagnosis Date  . Memory loss   . Multiple sclerosis ([place])    age 89  . Neuropathy ([place])   . Shingles   . 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 Vision abnormalities     Patient Active Problem List   Diagnosis Date Noted  . Neck pain 10/15/2015  . Other fatigue 01/08/2015  . Attention deficit disorder 01/08/2015  . Depression with anxiety 01/08/2015  . Gait disturbance 01/08/2015  . Clinical depression 09/19/2012  . DS (disseminated sclerosis) ([place]) 03/20/2012  . Multiple sclerosis ([place]) 03/20/2012  . Arthritis 12/14/2011    Past Surgical History:  Procedure Laterality Date  . CESAREAN SECTION      OB History    Gravida Para Term Preterm AB Living   1 1 1     1    SAB TAB Ectopic Multiple Live Births           1       Home Medications    Prior to Admission medications   Medication Sig Start Date End Date Taking? Authorizing Provider  amphetamine-dextroamphetamine (ADDERALL XR) 30 MG 24 hr capsule Take 1 capsule (30 mg total) by mouth daily. 11/28/15  Yes Britt Bottom, MD  calcium carbonate 200 MG capsule Take 250 mg by mouth 2 (two) times a week.    Yes Historical Provider, MD    Cholecalciferol (VITAMIN D3) 400 UNITS CAPS Take 1,000 Units by mouth.   Yes Historical Provider, MD  CONTRAVE 8-90 MG TB12 Take 2 tablets by mouth daily. 12/02/15  Yes Historical Provider, MD  cyclobenzaprine (FLEXERIL) 10 MG tablet Take 1 tablet (10 mg total) by mouth at bedtime as needed. Patient taking differently: Take 10 mg by mouth at bedtime as needed for muscle spasms.  03/05/15  Yes Britt Bottom, MD  escitalopram (LEXAPRO) 20 MG tablet TAKE 1 TABLET BY MOUTH EVERY DAY 12/02/15  Yes Britt Bottom, MD  Fingolimod HCl 0.5 MG CAPS Take 1 capsule (0.5 mg total) by mouth daily. 02/24/15  Yes Britt Bottom, MD  IRON PO Take 1 tablet by mouth 2 (two) times a week. occ    Yes Historical Provider, MD  meloxicam (MOBIC) 7.5 MG tablet TAKE 1 TO 2 TABLETS BY MOUTH EVERY DAY WITH FOOD AS NEEDED FOR PAIN 10/16/15  Yes Britt Bottom, MD  sertraline (ZOLOFT) 50 MG tablet Take 1 tablet (50 mg total) by mouth daily. 10/15/15  Yes Britt Bottom, MD  valACYclovir (VALTREX) 1000 MG tablet TAKE 1/2 TABLET BY MOUTH EVERY DAY Patient taking differently: TAKE 1/2 TABLET BY MOUTH EVERY DAY AS NEEDED FOR FEVER BLISTER. 05/05/15  Yes Salvadore Dom, MD  vitamin B-12 (CYANOCOBALAMIN) 1000 MCG  tablet Take 1,000 mcg by mouth.   Yes Historical Provider, MD  medroxyPROGESTERone (PROVERA) 10 MG tablet Take 1 tablet (10 mg total) by mouth daily. Patient not taking: Reported on 10/15/2015 04/02/15   Regina Eck, CNM  traZODone (DESYREL) 100 MG tablet Take 1 tablet (100 mg total) by mouth at bedtime. Patient not taking: Reported on 10/15/2015 01/08/15   Britt Bottom, MD    Family History Family History  Problem Relation Age of Onset  . Osteoporosis Mother   . Heart disease Mother   . Stroke Mother   . COPD Mother   . Cancer Father     colon  . Heart disease Brother   . Heart attack Brother     Social History Social History  Substance Use Topics  . Smoking status: Never Smoker  . Smokeless  tobacco: Never Used  . Alcohol use No     Comment: rare     Allergies   Patient has no known allergies.   Review of Systems Review of Systems  Constitutional: Negative for fever.  Gastrointestinal: Positive for abdominal pain. Negative for diarrhea, hematochezia, melena, nausea and vomiting.  Genitourinary: Negative for decreased urine volume, dysuria, hematuria, pelvic pain, urgency, vaginal bleeding, vaginal discharge and vaginal pain.  Ten systems are reviewed and are negative for acute change except as noted in the HPI    Physical Exam Updated Vital Signs BP 145/78 (BP Location: Left Arm)   Pulse 81   Temp 97.5 F (36.4 C) (Oral)   Resp 18   Ht 5\' 3"  (1.6 m)   Wt 213 lb (96.6 kg)   LMP 12/30/2014   SpO2 98%   BMI 37.73 kg/m   Physical Exam  Constitutional: She is oriented to person, place, and time. She appears well-developed and well-nourished. No distress.  HENT:  Head: Normocephalic and atraumatic.  Nose: Nose normal.  Eyes: Conjunctivae and EOM are normal. Pupils are equal, round, and reactive to light. Right eye exhibits no discharge. Left eye exhibits no discharge. No scleral icterus.  Neck: Normal range of motion. Neck supple.  Cardiovascular: Normal rate and regular rhythm.  Exam reveals no gallop and no friction rub.   No murmur heard. Pulmonary/Chest: Effort normal and breath sounds normal. No stridor. No respiratory distress. She has no rales.  Abdominal: Soft. She exhibits no distension. There is tenderness in the right lower quadrant. There is tenderness at McBurney's point. There is no rigidity, no rebound, no guarding and no CVA tenderness.  +rovsings and obturator signs  Musculoskeletal: She exhibits no edema or tenderness.  Neurological: She is alert and oriented to person, place, and time.  Skin: Skin is warm and dry. No rash noted. She is not diaphoretic. No erythema.  Psychiatric: She has a normal mood and affect.  Vitals reviewed.    ED  Treatments / Results  Labs (all labs ordered are listed, but only abnormal results are displayed) Labs Reviewed  LIPASE, BLOOD - Abnormal; Notable for the following:       Result Value   Lipase 164 (*)    All other components within normal limits  COMPREHENSIVE METABOLIC PANEL - Abnormal; Notable for the following:    ALT 66 (*)    All other components within normal limits  CBC  URINALYSIS, ROUTINE W REFLEX MICROSCOPIC (NOT AT [facility])    EKG  EKG Interpretation None       Radiology Ct Abdomen Pelvis W Contrast  Result Date: 01/02/2016 CLINICAL DATA:  Elevated  lipase, patient complains of right lower quadrant pain EXAM: CT ABDOMEN AND PELVIS WITH CONTRAST TECHNIQUE: Multidetector CT imaging of the abdomen and pelvis was performed using the standard protocol following bolus administration of intravenous contrast. CONTRAST:  176mL ISOVUE-300 IOPAMIDOL (ISOVUE-300) INJECTION 61% COMPARISON:  08/30/2005 FINDINGS: Lower chest: Visualized lung bases demonstrate no acute infiltrate or effusion. Hepatobiliary: The liver appears slightly enlarged measuring 20 cm in craniocaudad dimension. No focal hepatic abnormality is seen. No calcified gallstones. No biliary dilatation. Pancreas: Unremarkable. No pancreatic ductal dilatation or surrounding inflammatory changes. Spleen: Normal in size without focal abnormality. Adrenals/Urinary Tract: Adrenal glands are within normal limits. Tiny exophytic hypodensity lower right kidney, too small to further characterize. 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 Urinary bladder is grossly unremarkable. Stomach/Bowel: Stomach is nonenlarged. No dilated small bowel. The appendix is visualized and is normal. There is edema and soft tissue stranding adjacent to the sigmoid colon, this is associated with achy oval shaped fat density signal, the findings would be consistent with acute epiploic appendagitis. Vascular/Lymphatic: No  significant vascular findings are present. No enlarged abdominal or pelvic lymph nodes. Reproductive: Uterus and bilateral adnexa are unremarkable. Other: No free air.  No free fluid. Musculoskeletal: Multilevel degenerative changes. Mild grade 1 anterior listhesis of L4 on L5 with chronic appearing pars defect. IMPRESSION: 1. Edema and fat stranding adjacent to the sigmoid colon with imaging features suspicious for epiploic appendagitis. 2. No CT evidence for acute appendicitis. 3. Enlarged liver 4. 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 Sub cm exophytic hypodensity anterior lower pole right kidney, too small to further characterize. Electronically Signed   By: Donavan Foil M.D.   On: 01/02/2016 23:24    Procedures Procedures (including critical care time)  Medications Ordered in ED Medications  iopamidol (ISOVUE-300) 61 % injection (not administered)  sodium chloride 0.9 % injection (not administered)  iopamidol (ISOVUE-300) 61 % injection 100 mL (100 mLs Intravenous Contrast Given 01/02/16 2301)     Initial Impression / Assessment and Plan / ED Course  I have reviewed the triage vital signs and the nursing notes.  Pertinent labs & imaging results that were available during my care of the patient were reviewed by me and considered in my medical decision making (see chart for details).  Clinical Course     Labs and imaging reassuring. Likely epiploic appendicitis that is causing the patient's symptoms.  Tolerating by mouth. Safe for discharge with strict return precautions.  Final Clinical Impressions(s) / ED Diagnoses   Final diagnoses:  Right lower quadrant abdominal pain   Disposition: Discharge  Condition: Good  I have discussed the results, Dx and Tx plan with the patient who expressed understanding and agree(s) with the plan. Discharge instructions discussed at great length. The patient was given strict return precautions who verbalized understanding of the  instructions. No further questions at time of discharge.    Current Discharge Medication List      Follow Up: [name], MD [place] [address] [phone]  Schedule an appointment as soon as possible for a visit  in 5-7 days, If symptoms do not improve or  worsen      Fatima Blank, MD 01/02/16 [phone]

## 2016-01-02 NOTE — ED Triage Notes (Signed)
Patient complaining of right lower quadrant pain. Patient states the pain started yesterday and has continually to get worse. Patient has not taken anything for it.

## 2016-01-07 ENCOUNTER — Telehealth: Payer: Self-pay | Admitting: Neurology

## 2016-01-07 MED ORDER — AMPHETAMINE-DEXTROAMPHET ER 30 MG PO CP24: 30.0000 mg | ORAL_CAPSULE | Freq: Every day | ORAL | 0 refills | Status: DC

## 2016-01-07 NOTE — Telephone Encounter (Signed)
Pt request refill for amphetamine-dextroamphetamine (ADDERALL XR) 30 MG 24 hr capsule °

## 2016-01-07 NOTE — Telephone Encounter (Signed)
Adderall rx. up front [facility] 

## 2016-01-07 NOTE — Telephone Encounter (Signed)
Rx. awaiting RAS sig/fim 

## 2016-01-15 ENCOUNTER — Other Ambulatory Visit: Payer: Self-pay | Admitting: Neurology

## 2016-02-09 DIAGNOSIS — C801 Malignant (primary) neoplasm, unspecified: Secondary | ICD-10-CM

## 2016-02-09 HISTORY — DX: Malignant (primary) neoplasm, unspecified: C80.1

## 2016-02-10 ENCOUNTER — Other Ambulatory Visit: Payer: Self-pay | Admitting: Neurology

## 2016-02-11 ENCOUNTER — Telehealth: Payer: Self-pay | Admitting: Neurology

## 2016-02-11 ENCOUNTER — Other Ambulatory Visit: Payer: Self-pay | Admitting: Radiology

## 2016-02-11 MED ORDER — AMPHETAMINE-DEXTROAMPHET ER 30 MG PO CP24: 30.0000 mg | ORAL_CAPSULE | Freq: Every day | ORAL | 0 refills | Status: DC

## 2016-02-11 NOTE — Telephone Encounter (Signed)
Rx. awaiting RAS sig/fim 

## 2016-02-11 NOTE — Telephone Encounter (Signed)
Patient requesting refill of amphetamine-dextroamphetamine (ADDERALL XR) 30 MG 24 hr capsule. ° ° °

## 2016-02-11 NOTE — Addendum Note (Signed)
Addended by: France Ravens I on: 02/11/2016 11:51 AM   Modules accepted: Orders

## 2016-02-12 NOTE — Telephone Encounter (Signed)
Adderall rx. up front [facility] 

## 2016-02-13 ENCOUNTER — Telehealth: Payer: Self-pay | Admitting: *Deleted

## 2016-02-13 NOTE — Telephone Encounter (Signed)
Left message for a return phone call to schedule for [facility] 1/10. 

## 2016-02-13 NOTE — Telephone Encounter (Signed)
Received call back from patient.  Confirmed [facility] for 11/17/16 at 12:15pm .  Instructions and contact information given.

## 2016-02-17 ENCOUNTER — Other Ambulatory Visit: Payer: Self-pay | Admitting: *Deleted

## 2016-02-17 DIAGNOSIS — C50411 Malignant neoplasm of upper-outer quadrant of right female breast: Secondary | ICD-10-CM

## 2016-02-17 DIAGNOSIS — Z170 Estrogen receptor positive status [ER+]: Principal | ICD-10-CM

## 2016-02-18 ENCOUNTER — Ambulatory Visit
Admission: RE | Admit: 2016-02-18 | Discharge: 2016-02-18 | Disposition: A | Discharge status: Home / Self Care | Payer: BC Managed Care – PPO | Source: Ambulatory Visit | Attending: Radiation Oncology | Admitting: Radiation Oncology

## 2016-02-18 ENCOUNTER — Ambulatory Visit: Payer: Self-pay | Admitting: Surgery

## 2016-02-18 ENCOUNTER — Encounter: Payer: Self-pay | Admitting: Physical Therapy

## 2016-02-18 ENCOUNTER — Ambulatory Visit (HOSPITAL_BASED_OUTPATIENT_CLINIC_OR_DEPARTMENT_OTHER): Payer: BC Managed Care – PPO | Admitting: Hematology and Oncology

## 2016-02-18 ENCOUNTER — Encounter: Payer: Self-pay | Admitting: General Practice

## 2016-02-18 ENCOUNTER — Other Ambulatory Visit (HOSPITAL_BASED_OUTPATIENT_CLINIC_OR_DEPARTMENT_OTHER): Payer: BC Managed Care – PPO

## 2016-02-18 ENCOUNTER — Encounter: Payer: Self-pay | Admitting: Hematology and Oncology

## 2016-02-18 ENCOUNTER — Ambulatory Visit: Payer: BC Managed Care – PPO | Attending: Surgery | Admitting: Physical Therapy

## 2016-02-18 DIAGNOSIS — Z170 Estrogen receptor positive status [ER+]: Secondary | ICD-10-CM

## 2016-02-18 DIAGNOSIS — C50911 Malignant neoplasm of unspecified site of right female breast: Secondary | ICD-10-CM

## 2016-02-18 DIAGNOSIS — C50411 Malignant neoplasm of upper-outer quadrant of right female breast: Secondary | ICD-10-CM

## 2016-02-18 DIAGNOSIS — D0511 Intraductal carcinoma in situ of right breast: Secondary | ICD-10-CM

## 2016-02-18 DIAGNOSIS — R293 Abnormal posture: Secondary | ICD-10-CM | POA: Insufficient documentation

## 2016-02-18 LAB — CBC WITH DIFFERENTIAL/PLATELET
BASO%: 0.3 % (ref 0.0–2.0)
Basophils Absolute: 0 10*3/uL (ref 0.0–0.1)
EOS%: 6.7 % (ref 0.0–7.0)
Eosinophils Absolute: 0.3 10*3/uL (ref 0.0–0.5)
HCT: 42.9 % (ref 34.8–46.6)
HGB: 14.5 g/dL (ref 11.6–15.9)
LYMPH%: 6.7 % — AB (ref 14.0–49.7)
MCH: 31.5 pg (ref 25.1–34.0)
MCHC: 33.8 g/dL (ref 31.5–36.0)
MCV: 93.2 fL (ref 79.5–101.0)
MONO#: 0.4 10*3/uL (ref 0.1–0.9)
MONO%: 9.9 % (ref 0.0–14.0)
NEUT%: 76.4 % (ref 38.4–76.8)
NEUTROS ABS: 3.3 10*3/uL (ref 1.5–6.5)
Platelets: 272 10*3/uL (ref 145–400)
RBC: 4.6 10*6/uL (ref 3.70–5.45)
RDW: 14.2 % (ref 11.2–14.5)
WBC: 4.3 10*3/uL (ref 3.9–10.3)
lymph#: 0.3 10*3/uL — ABNORMAL LOW (ref 0.9–3.3)

## 2016-02-18 LAB — COMPREHENSIVE METABOLIC PANEL
ALT: 45 U/L (ref 0–55)
ANION GAP: 11 meq/L (ref 3–11)
AST: 31 U/L (ref 5–34)
Albumin: 4.2 g/dL (ref 3.5–5.0)
Alkaline Phosphatase: 109 U/L (ref 40–150)
BUN: 18.8 mg/dL (ref 7.0–26.0)
CHLORIDE: 105 meq/L (ref 98–109)
CO2: 27 meq/L (ref 22–29)
CREATININE: 0.8 mg/dL (ref 0.6–1.1)
Calcium: 9.5 mg/dL (ref 8.4–10.4)
EGFR: 85 mL/min/{1.73_m2} — AB (ref >90)
GLUCOSE: 83 mg/dL (ref 70–140)
Potassium: 3.9 mEq/L (ref 3.5–5.1)
SODIUM: 143 meq/L (ref 136–145)
TOTAL PROTEIN: 7.2 g/dL (ref 6.4–8.3)
Total Bilirubin: 0.8 mg/dL (ref 0.20–1.20)

## 2016-02-18 NOTE — Progress Notes (Signed)
Nutrition Assessment  Reason for Assessment:  Pt seen in Breast Clinic  ASSESSMENT:   56 year old female with new diagnosis of right breast cancer. Past medical history of MS.  Patient reports normal intake prior to visit today.  Medications:  reviewed  Labs: reviewed  Anthropometrics:   Height: 63 inches Weight: 232 lb BMI: 40.2   NUTRITION DIAGNOSIS: Food and nutrition related knowledge deficit related to new diagnosis of breast cancer as evidenced by no prior need for nutrition related information.  INTERVENTION:   Discussed and provided packet of information regarding nutritional tips for breast cancer patients.  Questions answered.  Teachback method used.      MONITORING, EVALUATION, and GOAL: Pt will consume a healthy plant based diet to maintain lean body mass throughout treatment.   Jules Baty B. Zenia Resides, [place], [place] (pager)

## 2016-02-18 NOTE — Progress Notes (Signed)
Radiation Oncology         [phone] ________________________________  Initial Outpatient Consultation  Name: Raven Montoya MRN: [id number]  Date: 02/18/2016  DOB: [birth year]  CC:Raven Mariscal, MD  Raven Luna, MD   REFERRING PHYSICIAN: Erroll Luna, MD  DIAGNOSIS:    ICD-9-CM ICD-10-CM   1. 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 female who presented with right architectural distortion on screening mammogram on 01/26/16. Ultrasound on 01/30/16 detected a 1.7 cm mass at the 11:30 position in the right breast with negative axilla. Biopsy on 02/11/16 showed focal invasive ductal carcinoma and low grade ductal carcinoma in situ with characteristics as described above in the diagnosis. Receptor status: ER (+) PR (+) Her2(pending) Ki67(2%).  Patient reports night sweats, fatigue, loss of sleep, glasses, feet swelling in the summer, lump in breast, back pain, joint pain, arthritis, difficulties walking, headaches, weakness, numbness, anxiety, and depression.  PREVIOUS RADIATION THERAPY: No  PAST MEDICAL HISTORY:  has a past medical history of Anxiety; Depression; Memory loss; Multiple sclerosis ([place]); Neuropathy ([place]); Shingles; STD (sexually transmitted disease); and Vision abnormalities.    PAST SURGICAL HISTORY: Past Surgical History:  Procedure Laterality Date  . CESAREAN SECTION      FAMILY HISTORY: family history includes COPD in her mother; Cancer in her father; Heart attack in her brother; Heart disease in her brother and mother; Osteoporosis in her mother; Stroke in her mother.  SOCIAL HISTORY:  reports that she has never smoked. She has never used  smokeless tobacco. She reports that she drinks alcohol. She reports that she does not use drugs.  ALLERGIES: Patient has no known allergies.  MEDICATIONS:  Current Outpatient Prescriptions  Medication Sig Dispense Refill  . amphetamine-dextroamphetamine (ADDERALL XR) 30 MG 24 hr capsule Take 1 capsule (30 mg total) by mouth daily. 30 capsule 0  . calcium carbonate 200 MG capsule Take 250 mg by mouth 2 (two) times a week.     . Cholecalciferol (VITAMIN D3) 400 UNITS CAPS Take 1,000 Units by mouth.    . CONTRAVE 8-90 MG TB12 Take 2 tablets by mouth daily.    . cyclobenzaprine (FLEXERIL) 10 MG tablet Take 1 tablet (10 mg total) by mouth at bedtime as needed. (Patient taking differently: Take 10 mg by mouth at bedtime as needed for muscle spasms. ) 30 tablet 11  . escitalopram (LEXAPRO) 20 MG tablet TAKE 1 TABLET BY MOUTH EVERY DAY 30 tablet 0  . GILENYA 0.5 MG CAPS TAKE 1 CAPSULE BY MOUTH EVERY DAY 90 capsule 2  . IRON PO Take 1 tablet by mouth 2 (two) times a week. occ     . medroxyPROGESTERone (PROVERA) 10 MG tablet Take 1 tablet (10 mg total) by mouth daily. (Patient not taking: Reported on 10/15/2015) 10 tablet 0  . meloxicam (MOBIC) 7.5 MG tablet TAKE 1 TO 2 TABLETS BY MOUTH EVERY DAY WITH FOOD AS NEEDED FOR PAIN 60 tablet 5  . sertraline (ZOLOFT) 50 MG tablet Take 1 tablet (50 mg total) by mouth daily. 30 tablet 11  . traZODone (DESYREL) 100 MG tablet Take 1 tablet (100 mg total) by mouth at bedtime. (Patient not taking: Reported  on 10/15/2015) 30 tablet 11  . valACYclovir (VALTREX) 1000 MG tablet TAKE 1/2 TABLET BY MOUTH EVERY DAY (Patient taking differently: TAKE 1/2 TABLET BY MOUTH EVERY DAY AS NEEDED FOR FEVER BLISTER.) 30 tablet 12  . vitamin B-12 (CYANOCOBALAMIN) 1000 MCG tablet Take 1,000 mcg by mouth.     No current facility-administered medications for this encounter.    Facility-Administered Medications Ordered in Other Encounters  Medication Dose Route Frequency Provider Last Rate  Last Dose  . gadopentetate dimeglumine (MAGNEVIST) injection 20 mL  20 mL Intravenous Once PRN Britt Bottom, MD        REVIEW OF SYSTEMS: A 10+ POINT REVIEW OF SYSTEMS WAS OBTAINED including neurology, dermatology, psychiatry, cardiac, respiratory, lymph, extremities, GI, GU, Musculoskeletal, constitutional, breasts, reproductive, HEENT.  All pertinent positives are noted in the HPI.  All others are negative.   PHYSICAL EXAM:  Vitals - 1 value per visit 6/33/3545  SYSTOLIC 625  DIASTOLIC 89  Pulse 79  Temperature 98.7  Respirations 16  Weight (lb) 212.4  Height '5\' 3"'   BMI 37.62  VISIT REPORT    General: Alert and oriented, in no acute distress Lungs are clear to auscultation bilaterally. Heart has regular rate and rhythm. No palpable cervical, supraclavicular, or axillary adenopathy. Abdomen soft, non-tender, normal bowel sounds. Breasts: Left breast: large and pendulous without mass or nipple discharge. Right breast some bruising noted in UIQ, no palpable mass, nipple discharge, or bleeding. No other palpable masses appreciated in the breasts or axillae.  ECOG = 1   LABORATORY DATA:  Lab Results  Component Value Date   WBC 4.3 02/18/2016   HGB 14.5 02/18/2016   HCT 42.9 02/18/2016   MCV 93.2 02/18/2016   PLT 272 02/18/2016   CMP     Component Value Date/Time   NA 143 02/18/2016 1247   K 3.9 02/18/2016 1247   CL 102 01/02/2016 2116   CO2 27 02/18/2016 1247   GLUCOSE 83 02/18/2016 1247   BUN 18.8 02/18/2016 1247   CREATININE 0.8 02/18/2016 1247   CALCIUM 9.5 02/18/2016 1247   PROT 7.2 02/18/2016 1247   ALBUMIN 4.2 02/18/2016 1247   AST 31 02/18/2016 1247   ALT 45 02/18/2016 1247   ALKPHOS 109 02/18/2016 1247   BILITOT 0.80 02/18/2016 1247   GFRNONAA >60 01/02/2016 2116   GFRAA >60 01/02/2016 2116         RADIOGRAPHY: [facility] images reviewed in breast conference    IMPRESSION/PLAN: Clinical stage 1 invasive ductal carcinoma of the right breast. She would be a  good candidate for breast conservation therapy with radiation therapy directed at the right breast.   Patient will undergo a right breast lumpectomy with sentinel lymph node biopsy, followed by adjuvant radiation, and aromatase inhibitor for 5 years. She will also have an oncotype dx test.  It was a pleasure meeting the patient today. We discussed the risks, benefits, and side effects of radiotherapy. I recommend radiotherapy to the right breast to reduce her risk of locoregional recurrence by 2/3.  We discussed that radiation would take approximately 6.5 weeks to complete and that I would give the patient a few weeks to heal following surgery before starting treatment planning. We spoke about acute effects including skin irritation and fatigue as well as much less common late effects including internal organ injury or irritation. We spoke about the latest technology that is used to minimize the risk of late effects for patients undergoing radiotherapy to the breast or chest wall. No  guarantees of treatment were given. The patient is enthusiastic about proceeding with treatment. I look forward to participating in the patient's care.  I will await her referral back to me for postoperative follow-up and eventual CT simulation/treatment planning.     __________________________________________  Blair Promise, MD  This document serves as a record of services personally performed by Gery Pray, MD. It was created on his behalf by Bethann Humble, a trained medical scribe. The creation of this record is based on the scribe's personal observations and the provider's statements to them. This document has been checked and approved by the attending provider.

## 2016-02-18 NOTE — Assessment & Plan Note (Signed)
Screening detected right breast architectural distortion, lean year density by ultrasound 1.7 cm at the 11:30 position axilla negative. Ultrasound biopsy: Low-grade DCIS with? Microscopic grade 1 invasive ductal carcinoma ER/PR positive HER-2 could not be done Ki-67 2%; T1a N0 stage IA  Pathology review: I discussed with the patient the difference between DCIS and invasive breast cancer. It is considered a precancerous lesion. DCIS is classified as a 0. It is generally detected through mammograms as calcifications. We discussed the significance of grades and its impact on prognosis. We also discussed the importance of ER and PR receptors and their implications to adjuvant treatment options. Prognosis of DCIS dependence on grade, comedo necrosis. It is anticipated that if not treated, 20-30% of DCIS can develop into invasive breast cancer.  Recommendation: 1. Breast conserving surgery 2. Followed by adjuvant radiation therapy 3. Followed by antiestrogen therapy with tamoxifen 5 years  Tamoxifen counseling: We discussed the risks and benefits of tamoxifen. These include but not limited to insomnia, hot flashes, mood changes, vaginal dryness, and weight gain. Although rare, serious side effects including endometrial cancer, risk of blood clots were also discussed. We strongly believe that the benefits far outweigh the risks. Patient understands these risks and consented to starting treatment. Planned treatment duration is 5 years.  Return to clinic after surgery to discuss the final pathology report and come up with an adjuvant treatment plan.

## 2016-02-18 NOTE — Therapy (Signed)
[place], [place], [address] Phone: [phone]   Fax:  [phone]  Physical Therapy Evaluation  Patient Details  Name: Raven Montoya MRN: [id number] Date of Birth: 04/30/1960 Referring Provider: Dr. Erroll Luna  Encounter Date: 02/18/2016      PT End of Session - 02/18/16 1613    Visit Number 1   Number of Visits 1   PT Start Time 0254   PT Stop Time 1502  Also saw pt very briefly from 1320-1323 and was then interupted by an MD   PT Time Calculation (min) 30 min   Activity Tolerance Patient tolerated treatment well   Behavior During Therapy [facility] for tasks assessed/performed      Past Medical History:  Diagnosis Date  . Anxiety   . Depression   . Memory loss   . Multiple sclerosis ([place])    age 56  . Neuropathy ([place])   . Shingles   . 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 Vision abnormalities     Past Surgical History:  Procedure Laterality Date  . CESAREAN SECTION      There were no vitals filed for this visit.       Subjective Assessment - 02/18/16 1603    Subjective Patient reports she is here today to meet with her medical team for her newly diagnosed right breast cancer.   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 It measures 1.7 cm and is located in the upper outer quadrant. It is ER/PR positive and has a Ki67 of 2%. She also has a diagnosis of multiple sclerosis which interferes with leg strength and long term ambulation at times.   Patient Stated Goals reduce lymphedema risk and learn post po shoulder ROM HEP   Currently in Pain? No/denies            [facility] PT Assessment - 02/18/16 0001      Assessment   Medical Diagnosis Right breast cancer   Referring Provider Dr. [name] [place]   Onset Date/Surgical Date 01/26/16   Hand Dominance Left   Prior Therapy none     Precautions   Precautions Other (comment)   Precaution Comments active cancer; multiple sclerosis     Restrictions   Weight Bearing Restrictions No     Balance Screen   Has the patient fallen in the past 6 months No   Has the patient had a decrease in activity level because of a fear of falling?  No   Is the patient reluctant to leave their home because of a fear of falling?  No     Home Environment   Living Environment Private residence   Living Arrangements Other relatives  Lives with her mom and 36 y.o. 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   Leisure She does not exercise     Cognition   Overall Cognitive Status Within Functional Limits for tasks assessed     Posture/Postural Control   Posture/Postural Control Postural limitations   Postural Limitations Forward head;Rounded Shoulders     ROM / Strength   AROM / PROM / Strength AROM;Strength     AROM   AROM Assessment Site  Shoulder;Cervical   Right/Left Shoulder Right;Left   Right Shoulder Extension 44 Degrees   Right Shoulder Flexion 132 Degrees   Right Shoulder ABduction 163 Degrees   Right Shoulder Internal Rotation 55 Degrees   Right Shoulder External Rotation 84 Degrees   Left Shoulder Extension 45 Degrees   Left Shoulder Flexion 143 Degrees   Left Shoulder ABduction 155 Degrees   Left Shoulder Internal Rotation 62 Degrees   Left Shoulder External Rotation 80 Degrees   Cervical Flexion WNL   Cervical Extension 75% limited   Cervical - Right Side Bend 75% limited   Cervical - Left Side Bend 75% limited   Cervical - Right Rotation 75% limited   Cervical - Left Rotation 75% limited     Strength   Overall Strength Within functional limits for tasks performed           LYMPHEDEMA/ONCOLOGY QUESTIONNAIRE - 02/18/16  1610      Type   Cancer Type Right breast cancer     Lymphedema Assessments   Lymphedema Assessments Upper extremities     Right Upper Extremity Lymphedema   10 cm Proximal to Olecranon Process 33.7 cm   Olecranon Process 27.5 cm   10 cm Proximal to Ulnar Styloid Process 24.8 cm   Just Proximal to Ulnar Styloid Process 17.9 cm   Across Hand at [employer] 19.5 cm   At [place] of 2nd Digit 6.9 cm     Left Upper Extremity Lymphedema   10 cm Proximal to Olecranon Process 36.4 cm   Olecranon Process 27.7 cm   10 cm Proximal to Ulnar Styloid Process 25.8 cm   Just Proximal to Ulnar Styloid Process 18.4 cm   Across Hand at [employer] 20.6 cm   At [place] of 2nd Digit 6.7 cm          Patient was instructed today in a home exercise program today for post op shoulder range of motion. These included active assist shoulder flexion in sitting, scapular retraction, wall walking with shoulder abduction, and hands behind head external rotation.  She was encouraged to do these twice a day, holding 3 seconds and repeating 5 times when permitted by her physician.                  PT Education - 02/18/16 1612    Education provided Yes   Education Details Lymphedema risk reduction and post op shoulder ROM HEP   Person(s) Educated Patient;Caregiver(s)   Methods Explanation;Demonstration;Handout   Comprehension Returned demonstration;Verbalized understanding              Breast Clinic Goals - 02/18/16 1621      Patient will be able to verbalize understanding of pertinent lymphedema risk reduction practices relevant to her diagnosis specifically related to skin care.   Time 1   Period Days   Status Achieved     Patient will be able to return demonstrate and/or verbalize understanding of the post-op home exercise program related to regaining shoulder range of motion.   Time 1   Period Days   Status Achieved     Patient will be able to verbalize understanding of the  importance of attending the postoperative After Breast Cancer Class for further lymphedema risk reduction education and therapeutic exercise.   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 It measures 1.7 cm and is located in the upper outer quadrant. It is ER/PR positive and has a Ki67 of 2%. She also has a diagnosis of multiple sclerosis which interferes with leg strength and long term ambulation at times. Her multidisciplinary medical team met prior to her assessments to determine a recommended treatment plan.  She is planning to have a right lumpectomy and sentinel node biopsy followed by radiation and anti-estrogen therapy.  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  Due to her comorbidity of multiple sclerosis and her evolving condition, her eval is of moderate complexity.   Rehab Potential Excellent   Clinical Impairments Affecting Rehab Potential Possibly multiple sclerosis   PT Frequency One time visit   PT Treatment/Interventions Therapeutic exercise;Patient/family education   PT Next Visit Plan Will f/u after surery to determine needs   PT Home Exercise Plan Post op shoulder ROM HEP   Consulted and Agree with Plan of Care Patient;Family member/caregiver   Family Member Consulted Friend      Patient will benefit from skilled therapeutic intervention in order to improve the following deficits and impairments:  Postural dysfunction, Pain, Decreased knowledge of precautions, Impaired UE functional use, Decreased range of motion  Visit Diagnosis: Abnormal posture - Plan: PT plan of care cert/re-cert  Carcinoma of upper-outer quadrant of right breast in female,  estrogen receptor positive ([place]) - Plan: PT plan of care cert/re-cert   Patient will follow up at outpatient cancer rehab if needed following surgery.  If the patient requires physical therapy at that time, a specific plan will be dictated and sent to the referring physician for approval. The patient was educated today on appropriate basic range of motion exercises to begin post operatively and the importance of attending the After Breast Cancer class following surgery.  Patient was educated today on lymphedema risk reduction practices as it pertains to recommendations that will benefit the patient immediately following surgery.  She verbalized good understanding.  No additional physical therapy is indicated at this time.      Problem List Patient Active Problem List   Diagnosis Date Noted  . Malignant neoplasm of upper-outer quadrant of right breast in female, estrogen receptor positive ([place]) 02/17/2016  . Neck pain 10/15/2015  . Other fatigue 01/08/2015  . Attention deficit disorder 01/08/2015  . Depression with anxiety 01/08/2015  . Gait disturbance 01/08/2015  . Clinical depression 09/19/2012  . DS (disseminated sclerosis) ([place]) 03/20/2012  . Multiple sclerosis ([place]) 03/20/2012  . Arthritis 12/14/2011   Annia Friendly, PT 02/18/16 4:23 PM  [place], [place], [address] Phone: [phone]   Fax:  [phone]  Name: Raven Montoya MRN: [id number] Date of Birth: [birth year]

## 2016-02-18 NOTE — Progress Notes (Signed)
[facility] NOTE  Patient Care Team: [name], MD as PCP - General (Internal Medicine) Erroll Luna, MD as Consulting Physician (General Surgery) [name], MD as Consulting Physician (Hematology and Oncology) Gery Pray, MD as Consulting Physician (Radiation Oncology)  CHIEF COMPLAINTS/PURPOSE OF CONSULTATION:  Newly diagnosed breast cancer  HISTORY OF PRESENTING ILLNESS:  Raven Montoya 56 y.o. female is here because of recent diagnosis of  Right-sided DCIS. Patient as a history of multiple sclerosis for past 20 years and is under good control. She had a screening mammogram that detected a right breast architectural distortion which was linear in dimension. By ultrasound measured 1.7 cm t 11:30 position. Axilla was negative.Ultrasound guided biopsy revealed low-grade DCiS with questionable microscopic focus of grade 1 invasive ductal carcinoma. The tumor was ER/PR positive HER-2 was not performed.Ki-67 was 2%.  I reviewed her records extensively and collaborated the history with the patient.  SUMMARY OF ONCOLOGIC HISTORY:   Malignant neoplasm of upper-outer quadrant of right breast in female, estrogen receptor positive ([place])   02/17/2016 Initial Diagnosis    Screening detected right breast architectural distortion, lean year density by ultrasound 1.7 cm at the 11:30 position axilla negative. Ultrasound biopsy: Low-grade DCIS with? Microscopic grade 1 invasive ductal carcinoma ER/PR positive HER-2 could not be done Ki-67 2%; T1a N0 stage IA       In terms of breast cancer risk profile:  She menarched at early age of 89 and went to menopause at age 67  She had one pregnancy, her first child was born at age 32  She has received birth control pills for approximately 12 years.  She was never exposed to fertility medications or hormone replacement therapy.  She has no family history of Breast/GYN/GI cancer  MEDICAL HISTORY:  Past Medical History:  Diagnosis  Date  . Anxiety   . Depression   . Memory loss   . Multiple sclerosis ([place])    age 56  . Neuropathy ([place])   . Shingles   . 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 Vision abnormalities     SURGICAL HISTORY: Past Surgical History:  Procedure Laterality Date  . CESAREAN SECTION      SOCIAL HISTORY: Social History   Social History  . Marital status: Divorced    Spouse name: N/A  . Number of children: N/A  . Years of education: N/A   Occupational History  . Not on file.   Social History Main Topics  . Smoking status: Never Smoker  . Smokeless tobacco: Never Used  . Alcohol use 0.0 oz/week     Comment: rare  . Drug use: No  . Sexual activity: Yes    Partners: Male    Birth control/ protection: Condom     Comment: partner has vasectomy   Other Topics Concern  . Not on file   Social History Narrative  . No narrative on file    FAMILY HISTORY: Family History  Problem Relation Age of Onset  . Osteoporosis Mother   . Heart disease Mother   . Stroke Mother   . COPD Mother   . Cancer Father     colon  . Heart disease Brother   . Heart attack Brother     ALLERGIES:  has No Known Allergies.  MEDICATIONS:  Current Outpatient Prescriptions  Medication Sig Dispense Refill  . amphetamine-dextroamphetamine (ADDERALL XR) 30 MG 24 hr capsule Take 1 capsule (30 mg total) by mouth daily.  30 capsule 0  . calcium carbonate 200 MG capsule Take 250 mg by mouth 2 (two) times a week.     . Cholecalciferol (VITAMIN D3) 400 UNITS CAPS Take 1,000 Units by mouth.    . CONTRAVE 8-90 MG TB12 Take 2 tablets by mouth daily.    . cyclobenzaprine (FLEXERIL) 10 MG tablet Take 1 tablet (10 mg total) by mouth at bedtime as needed. (Patient taking differently: Take 10 mg by mouth at bedtime as needed for muscle spasms. ) 30 tablet 11  . escitalopram (LEXAPRO) 20 MG tablet TAKE 1 TABLET BY MOUTH EVERY DAY 30 tablet 0  . GILENYA 0.5 MG CAPS TAKE 1 CAPSULE BY MOUTH EVERY DAY 90  capsule 2  . IRON PO Take 1 tablet by mouth 2 (two) times a week. occ     . medroxyPROGESTERone (PROVERA) 10 MG tablet Take 1 tablet (10 mg total) by mouth daily. (Patient not taking: Reported on 10/15/2015) 10 tablet 0  . meloxicam (MOBIC) 7.5 MG tablet TAKE 1 TO 2 TABLETS BY MOUTH EVERY DAY WITH FOOD AS NEEDED FOR PAIN 60 tablet 5  . sertraline (ZOLOFT) 50 MG tablet Take 1 tablet (50 mg total) by mouth daily. 30 tablet 11  . traZODone (DESYREL) 100 MG tablet Take 1 tablet (100 mg total) by mouth at bedtime. (Patient not taking: Reported on 10/15/2015) 30 tablet 11  . valACYclovir (VALTREX) 1000 MG tablet TAKE 1/2 TABLET BY MOUTH EVERY DAY (Patient taking differently: TAKE 1/2 TABLET BY MOUTH EVERY DAY AS NEEDED FOR FEVER BLISTER.) 30 tablet 12  . vitamin B-12 (CYANOCOBALAMIN) 1000 MCG tablet Take 1,000 mcg by mouth.     No current facility-administered medications for this visit.    Facility-Administered Medications Ordered in Other Visits  Medication Dose Route Frequency Provider Last Rate Last Dose  . gadopentetate dimeglumine (MAGNEVIST) injection 20 mL  20 mL Intravenous Once PRN Britt Bottom, MD        REVIEW OF SYSTEMS:   Constitutional: Denies fevers, chills or abnormal night sweats Eyes: Denies blurriness of vision, double vision or watery eyes Ears, nose, mouth, throat, and face: Denies mucositis or sore throat Respiratory: Denies cough, dyspnea or wheezes Cardiovascular: Denies palpitation, chest discomfort or lower extremity swelling Gastrointestinal:  Denies nausea, heartburn or change in bowel habits Skin: Denies abnormal skin rashes Lymphatics: Denies new lymphadenopathy or easy bruising Neurological:Denies numbness, tingling or new weaknesses Behavioral/Psych: Mood is stable, no new changes  Breast:  Denies any palpable lumps or discharge All other systems were reviewed with the patient and are negative.  PHYSICAL EXAMINATION: ECOG PERFORMANCE STATUS: 0 -  Asymptomatic  Vitals:   02/18/16 1453  BP: 117/89  Pulse: 79  Resp: 16  Temp: 98.7 F (37.1 C)   Filed Weights   02/18/16 1453  Weight: 212 lb 6.4 oz (96.3 kg)    GENERAL:alert, no distress and comfortable SKIN: skin color, texture, turgor are normal, no rashes or significant lesions EYES: normal, conjunctiva are pink and non-injected, sclera clear OROPHARYNX:no exudate, no erythema and lips, buccal mucosa, and tongue normal  NECK: supple, thyroid normal size, non-tender, without nodularity LYMPH:  no palpable lymphadenopathy in the cervical, axillary or inguinal LUNGS: clear to auscultation and percussion with normal breathing effort HEART: regular rate & rhythm and no murmurs and no lower extremity edema ABDOMEN:abdomen soft, non-tender and normal bowel sounds Musculoskeletal:no cyanosis of digits and no clubbing  PSYCH: alert & oriented x 3 with fluent speech NEURO: no focal motor/sensory deficits  BREAST: No palpable nodules in breast. No palpable axillary or supraclavicular lymphadenopathy (exam performed in the presence of a chaperone)   LABORATORY DATA:  I have reviewed the data as listed Lab Results  Component Value Date   WBC 4.3 02/18/2016   HGB 14.5 02/18/2016   HCT 42.9 02/18/2016   MCV 93.2 02/18/2016   PLT 272 02/18/2016   Lab Results  Component Value Date   NA 143 02/18/2016   K 3.9 02/18/2016   CL 102 01/02/2016   CO2 27 02/18/2016    RADIOGRAPHIC STUDIES: I have personally reviewed the radiological reports and agreed with the findings in the report.  ASSESSMENT AND PLAN:  Malignant neoplasm of upper-outer quadrant of right breast in female, estrogen receptor positive ([place]) Screening detected right breast architectural distortion, lean year density by ultrasound 1.7 cm at the 11:30 position axilla negative. Ultrasound biopsy: Low-grade DCIS with? Microscopic grade 1 invasive ductal carcinoma ER/PR positive HER-2 could not be done Ki-67 2%; T1a N0  stage IA  Pathology review: I discussed with the patient the difference between DCIS and invasive breast cancer. It is considered a precancerous lesion. DCIS is classified as a 0. It is generally detected through mammograms as calcifications. We discussed the significance of grades and its impact on prognosis. We also discussed the importance of ER and PR receptors and their implications to adjuvant treatment options. Prognosis of DCIS dependence on grade, comedo necrosis. It is anticipated that if not treated, 20-30% of DCIS can develop into invasive breast cancer.  Recommendation: 1. Breast conserving surgery 2. Followed by adjuvant radiation therapy 3. Followed by antiestrogen therapy with letrozole 5 years  Letrozole counseling: We discussed the risks and benefits of anti-estrogen therapy with aromatase inhibitors. These include but not limited to insomnia, hot flashes, mood changes, vaginal dryness, bone density loss, and weight gain. We strongly believe that the benefits far outweigh the risks. Patient understands these risks and consented to starting treatment. Planned treatment duration is 5 years.  Return to clinic after surgery to discuss the final pathology report and come up with an adjuvant treatment plan.  All questions were answered. The patient knows to call the clinic with any problems, questions or concerns.    Rulon Eisenmenger, MD 02/18/16

## 2016-02-18 NOTE — Patient Instructions (Signed)

## 2016-02-18 NOTE — H&P (Signed)
Raven Montoya 02/18/2016 8:01 AM Location: [facility] Patient #: [id number] DOB: 01/30/1961 Undefined / Language: Raven Montoya / Race: White Female  History of Present Illness Raven Moores A. Seth Friedlander MD; 02/18/2016 2:39 PM) Patient words: patient sent at the request of Dr.Kinard for mammographic abnormality involving the right breast. This was picked up on screening mammogram and subsequent diagnostic mammogram. A 1.7 cm area of distortion in the right breast upper outer quadrant was noted. 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 This was ER positive, PR positive and HER-2/neu is pending. Proliferation ratio was 2%. Patient denies any historbiof erally.pain, breast mass or nipple discharge bilaterally. She does a history of multiple sclerosis which has been stable.She relates a history of colon, renal prostate cancer in her family.  The patient is a 56 year old female.   Past Surgical History Raven Pummel, RN; 02/18/2016 8:02 AM) Breast Biopsy Right. Cesarean Section - 1 Foot Surgery Bilateral.  Diagnostic Studies History Raven Pummel, RN; 02/18/2016 8:02 AM) Colonoscopy within last year Mammogram within last year Pap Smear 1-5 years ago  Medication History Raven Pummel, RN; 02/18/2016 8:02 AM) Medications Reconciled  Social History Raven Pummel, RN; 02/18/2016 8:02 AM) Alcohol use Occasional alcohol use. Caffeine use Carbonated beverages, Coffee, Tea. No drug use Tobacco use Never smoker.  Family History Raven Pummel, RN; 02/18/2016 8:02 AM) Arthritis Mother. Cancer Family Members In General. Cerebrovascular Accident Mother. Colon Cancer Father. Colon Polyps Father. Depression Mother. Diabetes Mellitus Family Members In General. Heart Disease Mother. Hypertension Mother. Migraine Headache Mother. Prostate Cancer Father. Respiratory Condition Mother. Thyroid problems Mother.  Pregnancy / Birth  History Raven Pummel, RN; 02/18/2016 8:02 AM) Age at menarche 89 years. Age of menopause 51-55 Contraceptive History Oral contraceptives. Gravida 2 Maternal age 82-30 Para 1  Other Problems Raven Pummel, RN; 02/18/2016 8:02 AM) Anxiety Disorder Arthritis Depression Kidney Stone Lump In Breast     Review of Systems Raven Spillers Ledford RN; 02/18/2016 8:02 AM) General Not Present- Appetite Loss, Chills, Fatigue, Fever, Night Sweats, Weight Gain and Weight Loss. Skin Not Present- Change in Wart/Mole, Dryness, Hives, Jaundice, New Lesions, Non-Healing Wounds, Rash and Ulcer. HEENT Present- Visual Disturbances. Not Present- Earache, Hearing Loss, Hoarseness, Nose Bleed, Oral Ulcers, Ringing in the Ears, Seasonal Allergies, Sinus Pain, Sore Throat, Wears glasses/contact lenses and Yellow Eyes. Respiratory Not Present- Bloody sputum, Chronic Cough, Difficulty Breathing, Snoring and Wheezing. Breast Present- Breast Mass. Not Present- Breast Pain, Nipple Discharge and Skin Changes. Cardiovascular Present- Leg Cramps. Not Present- Chest Pain, Difficulty Breathing Lying Down, Palpitations, Rapid Heart Rate, Shortness of Breath and Swelling of Extremities. Gastrointestinal Not Present- Abdominal Pain, Bloating, Bloody Stool, Change in Bowel Habits, Chronic diarrhea, Constipation, Difficulty Swallowing, Excessive gas, Gets full quickly at meals, Hemorrhoids, Indigestion, Nausea, Rectal Pain and Vomiting. Female Genitourinary Not Present- Frequency, Nocturia, Painful Urination, Pelvic Pain and Urgency. Musculoskeletal Present- Joint Pain, Joint Stiffness, Muscle Pain and Muscle Weakness. Not Present- Back Pain and Swelling of Extremities. Neurological Present- Trouble walking and Weakness. Not Present- Decreased Memory, Fainting, Headaches, Numbness, Seizures, Tingling and Tremor. Psychiatric Present- Change in Sleep Pattern and Depression. Not Present- Anxiety, Bipolar, Fearful and Frequent  crying. Endocrine Present- Hot flashes. Not Present- Cold Intolerance, Excessive Hunger, Hair Changes, Heat Intolerance and New Diabetes. Hematology Not Present- Blood Thinners, Easy Bruising, Excessive bleeding, Gland problems, HIV and Persistent Infections.   Physical Exam (Raven Earwood A. Yuvraj Pfeifer MD; 02/18/2016 2:39 PM)  General Mental Status-Alert. General Appearance-Consistent with stated age. Hydration-Well hydrated. Voice-Normal.  Head and Neck Head-normocephalic, atraumatic with no lesions or palpable masses. Trachea-midline. Thyroid Gland Characteristics - normal size and consistency.  Eye Eyeball - Bilateral-Extraocular movements intact. Sclera/Conjunctiva - Bilateral-No scleral icterus.  Chest and Lung Exam Chest and lung exam reveals -quiet, even and easy respiratory effort with no use of accessory muscles and on auscultation, normal breath sounds, no adventitious sounds and normal vocal resonance. Inspection Chest Wall - Normal. Back - normal.  Breast Breast - Left-Symmetric, Non Tender, No Biopsy scars, no Dimpling, No Inflammation, No Lumpectomy scars, No Mastectomy scars, No Peau d' Orange. Breast - Right-Symmetric, Non Tender, No Biopsy scars, no Dimpling, No Inflammation, No Lumpectomy scars, No Mastectomy scars, No Peau d' Orange. Breast Lump-No Palpable Breast Mass.  Neurologic Neurologic evaluation reveals -alert and oriented x 3 with no impairment of recent or remote memory. Mental Status-Normal.  Musculoskeletal Normal Exam - Left-Upper Extremity Strength Normal and Lower Extremity Strength Normal. Normal Exam - Right-Upper Extremity Strength Normal and Lower Extremity Strength Normal.  Lymphatic Head & Neck  General Head & Neck Lymphatics: Bilateral - Description - Normal. Axillary  General Axillary Region: Bilateral - Description - Normal. Tenderness - Non Tender.    Assessment & Plan (Raven Vetrano A. Tylin Force MD; 02/18/2016  2:41 PM)  BREAST CANCER, RIGHT (C50.911) Impression: Discussed breast conservation versus mastectomy. Discussed the pros and cons of each as well as long term expectations. Discussed reconstruction with mastectomy. Patient has opted for right breast seed localized partial mastectomy with sentinel lymph node mapping Risk of lumpectomy include bleeding, infection, seroma, more surgery, use of seed/wire, wound care, cosmetic deformity and the need for other treatments, death , blood clots, death. Pt agrees to proceed. Risk of sentinel lymph node mapping include bleeding, infection, lymphedema, shoulder pain. stiffness, dye allergy. cosmetic deformity , blood clots, death, need for more surgery. Pt agres to proceed.  Current Plans You are being scheduled for surgery- Our schedulers will call you.  You should hear from our office's scheduling department within 5 working days about the location, date, and time of surgery. We try to make accommodations for patient's preferences in scheduling surgery, but sometimes the OR schedule or the surgeon's schedule prevents [place] from making those accommodations.  If you have not heard from our office [phone]) in 5 working days, call the office and ask for your surgeon's nurse.  If you have other questions about your diagnosis, plan, or surgery, call the office and ask for your surgeon's nurse.  Pt Education - [facility] Breast Cancer Information Given - [facility] "Breast Journey" Package We discussed the staging and pathophysiology of breast cancer. We discussed all of the different options for treatment for breast cancer including surgery, chemotherapy, radiation therapy, Herceptin, and antiestrogen therapy. We discussed a sentinel lymph node biopsy as she does not appear to having lymph node involvement right now. We discussed the performance of that with injection of radioactive tracer and blue dye. We discussed that she would have an incision underneath her  axillary hairline. We discussed that there is a bout a 10-20% chance of having a positive node with a sentinel lymph node biopsy and we will await the permanent pathology to make any other first further decisions in terms of her treatment. One of these options might be to return to the operating room to perform an axillary lymph node dissection. We discussed about a 1-2% risk lifetime of chronic shoulder pain as well as lymphedema associated with a sentinel lymph node biopsy. We discussed the options for  treatment of the breast cancer which included lumpectomy versus a mastectomy. We discussed the performance of the lumpectomy with a wire placement. We discussed a 10-20% chance of a positive margin requiring reexcision in the operating room. We also discussed that she may need radiation therapy or antiestrogen therapy or both if she undergoes lumpectomy. We discussed the mastectomy and the postoperative care for that as well. We discussed that there is no difference in her survival whether she undergoes lumpectomy with radiation therapy or antiestrogen therapy versus a mastectomy. There is a slight difference in the local recurrence rate being 3-5% with lumpectomy and about 1% with a mastectomy. We discussed the risks of operation including bleeding, infection, possible reoperation. She understands her further therapy will be based on what her stages at the time of her operation.  Pt Education - flb breast cancer surgery: discussed with patient and provided information. Pt Education - [facility] Breast Biopsy HCI: discussed with patient and provided information. Pt Education - ABC (After Breast Cancer) Class Info: discussed with patient and provided information.

## 2016-02-19 NOTE — Addendum Note (Signed)
Addended by: Jaci Carrel A on: 02/19/2016 11:28 AM   Modules accepted: Orders

## 2016-02-20 NOTE — Progress Notes (Signed)
[place] Psychosocial Distress Screening Spiritual Care  Met with Raven Montoya") and friend Raven Montoya in [facility] to introduce [place] team/resources, reviewing distress screen per protocol.  The patient scored a 4 on the Psychosocial Distress Thermometer which indicates moderate distress. Also assessed for distress and other psychosocial needs.   ONCBCN DISTRESS SCREENING 02/20/2016  Screening Type Initial Screening  Distress experienced in past week (1-10) 4  Family Problem type Other (comment)  Emotional problem type Depression;Nervousness/Anxiety  Information Concerns Type Lack of info about diagnosis;Lack of info about treatment;Lack of info about complementary therapy choices;Lack of info about maintaining fitness  Physical Problem type Sleep/insomnia  Referral to support programs Yes  Other Spiritual Care, counseling intern   [facility] shared at length about her laid-back attitude, hx depression/anxiety/insomnia, and caregiving responsibilities.  (Her 106yo son and mom both live with her; mom has memory issues and other needs due to hx stroke.) Per pt, [facility] resolved her information concerns, and she has no other needs at this time.  [address] Raven Montoya describes herself as someone who just takes challenges as they come, without talking about them--she tends to cope inwardly.  She reports good emotional support from her work/social network (she drives a school bus).  Follow up needed: No.  She has full packet of [place] team/programming resources and knows to call [facility] if any additional questions arise.  As always, please also page if immediate needs surface.  Thank you.   [place], [place], [facility] Pager [phone] Voicemail [phone]

## 2016-02-23 ENCOUNTER — Telehealth: Payer: Self-pay | Admitting: *Deleted

## 2016-02-23 NOTE — Telephone Encounter (Signed)
Left vm regarding [facility] from 02/19/16. Contact information provided. 

## 2016-02-24 ENCOUNTER — Ambulatory Visit: Payer: Self-pay | Admitting: Surgery

## 2016-02-24 DIAGNOSIS — N62 Hypertrophy of breast: Secondary | ICD-10-CM | POA: Insufficient documentation

## 2016-02-24 DIAGNOSIS — N6489 Other specified disorders of breast: Secondary | ICD-10-CM | POA: Insufficient documentation

## 2016-03-11 ENCOUNTER — Encounter: Payer: Self-pay | Admitting: Radiation Oncology

## 2016-03-11 ENCOUNTER — Telehealth: Payer: Self-pay | Admitting: Hematology and Oncology

## 2016-03-11 NOTE — Telephone Encounter (Signed)
faxed FMLA forms to [facility] fax [phone]

## 2016-03-12 ENCOUNTER — Telehealth: Payer: Self-pay | Admitting: Hematology and Oncology

## 2016-03-12 NOTE — Telephone Encounter (Signed)
lvm to inform pt of 2/22 appt date/time per LOS

## 2016-03-16 ENCOUNTER — Encounter: Payer: Self-pay | Admitting: Neurology

## 2016-03-16 ENCOUNTER — Ambulatory Visit (INDEPENDENT_AMBULATORY_CARE_PROVIDER_SITE_OTHER): Payer: BC Managed Care – PPO | Admitting: Neurology

## 2016-03-16 VITALS — BP 142/68 | HR 78 | Resp 16 | Ht 63.0 in | Wt 227.5 lb

## 2016-03-16 DIAGNOSIS — G35 Multiple sclerosis: Secondary | ICD-10-CM | POA: Diagnosis not present

## 2016-03-16 DIAGNOSIS — C50411 Malignant neoplasm of upper-outer quadrant of right female breast: Secondary | ICD-10-CM | POA: Diagnosis not present

## 2016-03-16 DIAGNOSIS — R5383 Other fatigue: Secondary | ICD-10-CM | POA: Diagnosis not present

## 2016-03-16 DIAGNOSIS — F418 Other specified anxiety disorders: Secondary | ICD-10-CM | POA: Diagnosis not present

## 2016-03-16 DIAGNOSIS — R269 Unspecified abnormalities of gait and mobility: Secondary | ICD-10-CM

## 2016-03-16 DIAGNOSIS — F988 Other specified behavioral and emotional disorders with onset usually occurring in childhood and adolescence: Secondary | ICD-10-CM | POA: Diagnosis not present

## 2016-03-16 DIAGNOSIS — Z170 Estrogen receptor positive status [ER+]: Secondary | ICD-10-CM | POA: Diagnosis not present

## 2016-03-16 MED ORDER — AMPHETAMINE-DEXTROAMPHET ER 30 MG PO CP24: 30.0000 mg | ORAL_CAPSULE | Freq: Every day | ORAL | 0 refills | Status: DC

## 2016-03-16 NOTE — Progress Notes (Signed)
[facility]  PATIENT: Raven Montoya DOB: [birth year]  REFERRING CLINICIAN: Anastasia Pall HISTORY FROM: Patient REASON FOR VISIT: MS   HISTORICAL  CHIEF COMPLAINT:  Chief Complaint  Patient presents with  . Multiple Sclerosis    Sts. she continues to tolerate Gilenya well.  Since last ov, she has been dx. with breast cancer (right)  She is scheduled  for a lumpectomy.  Sts. she has been told she will possibly need radiation, but no chemo.  She has been under more stress related to her mother's health issues as well/fim    HISTORY OF PRESENT ILLNESS:  Raven Montoya is a 56 year old woman with MS.     She has a lump in her breast and was diagnosed with stage 1A breast cancer and is scheduled for lumpectomy.   She does not need chemotherapy but will be getting RadRx.    MS:    She is on Gilenya and she tolerates it well.   She denies any exacerbation or new MS symptoms.     She tolerates it well.   Her last MRI 02/12/2015 was unchanged compared to 11/17/2013 MRI (around time she started Gilenya).      Gait/strength/sensation:    Gait is doing well with occasional trips but no falls.    She denies weakness in the arms or legs.    She reports tingling an cramping in hands and legs at times.  These are intermittent.  There is no painful dysesthesias.   Bladder/bowel:   She denies any significant issues with these.  Vision:   She denies any MS related vision problem.   She wears reading glasses.    Fatigue/Sleep:   She has mild cognitive and physical fatigue.   She drives a lot.    Adderall XR 30 mg daily helps her fatigue and focus.    She had mild sleep onset insomnia which is milder now and she did not feel trazodone helped much.  some nights but is better on 1/2 of a trazodone.     A PSG in the past did not show OSA (she snores).   Her mild  sleepiness is better on Adderall  Mood:   She notes some depression and anxiety.  More stress with her mother's medical issues. She  also has medical issues.     Cognition:    She notes her decreased attention and focus improved with  Adderall.  Neck pain: She is still having neck pain and stiffness.   Pain is on the left and increases with turning her head left.    Pain is crampy. There is no radiating pain down the arm.   Meloxicam helps.   Trigger point injections have helped but we will hold off as surgery planned.   She denies arm pain or weakness.  MS History:  She was diagnosed with multiple sclerosis at age 34 after presenting with optic neuritis in the left eye. Her neurologist discussed with her that she probably had MS but no medication was started at that time. Vision got better over the next few weeks. A couple years later, she went to see Dr. Effie Shy. He felt that the MS should be treated and she was started on Rebif. She remain on Rebif for many years   She had not had any major exacerbations while on Rebif but had needles fatigue.    In 2014, she switched to [place]. She tolerates Gilenya well.   REVIEW OF SYSTEMS:  Constitutional: No  fevers, chills, sweats, or change in appetite.   She has fatigue and mild insomnia Eyes: No visual changes, double vision, eye pain Ear, nose and throat: No hearing loss, ear pain, nasal congestion, sore throat Cardiovascular: No chest pain, palpitations Respiratory:  No shortness of breath at rest or with exertion.   No wheezes GastrointestinaI: No nausea, vomiting, diarrhea, abdominal pain, fecal incontinence Genitourinary:  No dysuria, urinary retention or frequency.  No nocturia. Musculoskeletal:  Mild neck pain, no major back pain Integumentary: No rash, pruritus, skin lesions Neurological: as above Psychiatric: as above.    Endocrine: No palpitations, diaphoresis, change in appetite, change in weigh or increased thirst Hematologic/Lymphatic:  No anemia, purpura, petechiae. Allergic/Immunologic: No itchy/runny eyes, nasal congestion, recent allergic reactions,  rashes  ALLERGIES: No Known Allergies  HOME MEDICATIONS: Outpatient Medications Prior to Visit  Medication Sig Dispense Refill  . calcium carbonate 200 MG capsule Take 250 mg by mouth 2 (two) times a week.     . Cholecalciferol (VITAMIN D3) 400 UNITS CAPS Take 1,000 Units by mouth.    . CONTRAVE 8-90 MG TB12 Take 2 tablets by mouth daily.    . cyclobenzaprine (FLEXERIL) 10 MG tablet Take 1 tablet (10 mg total) by mouth at bedtime as needed. (Patient taking differently: Take 10 mg by mouth at bedtime as needed for muscle spasms. ) 30 tablet 11  . escitalopram (LEXAPRO) 20 MG tablet TAKE 1 TABLET BY MOUTH EVERY DAY 30 tablet 0  . GILENYA 0.5 MG CAPS TAKE 1 CAPSULE BY MOUTH EVERY DAY 90 capsule 2  . IRON PO Take 1 tablet by mouth 2 (two) times a week. occ     . meloxicam (MOBIC) 7.5 MG tablet TAKE 1 TO 2 TABLETS BY MOUTH EVERY DAY WITH FOOD AS NEEDED FOR PAIN 60 tablet 5  . sertraline (ZOLOFT) 50 MG tablet Take 1 tablet (50 mg total) by mouth daily. 30 tablet 11  . traZODone (DESYREL) 100 MG tablet Take 1 tablet (100 mg total) by mouth at bedtime. 30 tablet 11  . valACYclovir (VALTREX) 1000 MG tablet TAKE 1/2 TABLET BY MOUTH EVERY DAY (Patient taking differently: TAKE 1/2 TABLET BY MOUTH EVERY DAY AS NEEDED FOR FEVER BLISTER.) 30 tablet 12  . vitamin B-12 (CYANOCOBALAMIN) 1000 MCG tablet Take 1,000 mcg by mouth.    Marland Kitchen amphetamine-dextroamphetamine (ADDERALL XR) 30 MG 24 hr capsule Take 1 capsule (30 mg total) by mouth daily. 30 capsule 0   Facility-Administered Medications Prior to Visit  Medication Dose Route Frequency Provider Last Rate Last Dose  . gadopentetate dimeglumine (MAGNEVIST) injection 20 mL  20 mL Intravenous Once PRN Britt Bottom, MD        PAST MEDICAL HISTORY: Past Medical History:  Diagnosis Date  . Anxiety   . Depression   . Memory loss   . Multiple sclerosis ([place])    age 59  . Neuropathy ([place])   . Shingles   . 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 Vision abnormalities     PAST SURGICAL HISTORY: Past Surgical History:  Procedure Laterality Date  . CESAREAN SECTION      FAMILY HISTORY: Family History  Problem Relation Age of Onset  . Osteoporosis Mother   . Heart disease Mother   . Stroke Mother   . COPD Mother   . Cancer Father     colon  . Heart disease Brother   . Heart attack Brother     SOCIAL HISTORY:  Social History   Social History  . Marital status: Divorced    Spouse name: N/A  . Number of children: N/A  . Years of education: N/A   Occupational History  . Not on file.   Social History Main Topics  . Smoking status: Never Smoker  . Smokeless tobacco: Never Used  . Alcohol use 0.0 oz/week     Comment: rare  . Drug use: No  . Sexual activity: Yes    Partners: Male    Birth control/ protection: Condom     Comment: partner has vasectomy   Other Topics Concern  . Not on file   Social History Narrative  . No narrative on file     PHYSICAL EXAM  Vitals:   03/16/16 1311  BP: (!) 142/68  Pulse: 78  Resp: 16  Weight: 227 lb 8 oz (103.2 kg)  Height: 5\' 3"  (1.6 m)    Body mass index is 40.3 kg/m.   General: The patient is well-developed and well-nourished and in no acute distress  Musculoskeletal:   She has a reduced ROM in the neck with rotation. She has tenderness primarily over the left trapezius muscle. Mild tenderness over the lower cervical paraspinal muscles  Neurologic Exam  Mental status: The patient is alert and oriented x 3 at the time of the examination. The patient has apparent normal recent and remote memory, with an apparently normal attention span and concentration ability.   Speech is normal.  Cranial nerves: Extraocular movements are full. Facial strength is normal.  Trapezius and sternocleidomastoid strength is normal. No dysarthria is noted.  The tongue is midline, and the patient has symmetric elevation of the soft palate. No obvious hearing deficits are  noted.  Motor:  Muscle bulk and tone are normal. Strength is  5 / 5 in all 4 extremities.   Sensory: Sensory testing is intact to pinprick, soft touch, vibration sensation, and position sense on all 4 extremities.  Coordination: Cerebellar testing reveals good finger-nose-finger and heel-to-shin bilaterally.  Gait and station: Station and gait are normal. Tandem gait is wide. Romberg is negative.   Reflexes: Deep tendon reflexes are symmetric and brisk bilaterally. Marland Kitchen    DIAGNOSTIC DATA (LABS, IMAGING, TESTING) - I reviewed patient records, labs, notes, testing and imaging myself where available.  Lab Results  Component Value Date   WBC 4.3 02/18/2016   HGB 14.5 02/18/2016   HCT 42.9 02/18/2016   MCV 93.2 02/18/2016   PLT 272 02/18/2016      Component Value Date/Time   NA 143 02/18/2016 1247   K 3.9 02/18/2016 1247   CL 102 01/02/2016 2116   CO2 27 02/18/2016 1247   GLUCOSE 83 02/18/2016 1247   BUN 18.8 02/18/2016 1247   CREATININE 0.8 02/18/2016 1247   CALCIUM 9.5 02/18/2016 1247   PROT 7.2 02/18/2016 1247   ALBUMIN 4.2 02/18/2016 1247   AST 31 02/18/2016 1247   ALT 45 02/18/2016 1247   ALKPHOS 109 02/18/2016 1247   BILITOT 0.80 02/18/2016 1247   GFRNONAA >60 01/02/2016 2116   GFRAA >60 01/02/2016 2116   Lab Results  Component Value Date   CHOL 161 10/17/2012   HDL 48 10/17/2012   LDLCALC 66 10/17/2012   TRIG 236 (H) 10/17/2012   CHOLHDL 3.4 10/17/2012   Lab Results  Component Value Date   HGBA1C 5.4 10/17/2012   No results found for: [id number] Lab Results  Component Value Date   TSH 1.71 04/01/2015  ASSESSMENT AND PLAN Multiple sclerosis (HCC)  Attention deficit disorder, unspecified hyperactivity presence  Gait disturbance  Malignant neoplasm of upper-outer quadrant of right breast in female, estrogen receptor positive ([place])  Depression with anxiety  Other fatigue   1.  Continue Gilenya.  2.  Exercise as tolerated and remain  active. 3.  Continue Adderall to 30 mg XR daily.  4.   ContinueZoloft 50 mg for anxiety She will come back to see [place] in about 5 months or call sooner if she has new or worsening neurologic symptoms.   Lacresha Fusilier A. Felecia Shelling, MD, PhD [id number], [id number] PM Certified in Neurology, Clinical Neurophysiology, Sleep Medicine, Pain Medicine and Neuroimaging  [facility] [address], [place], [place] [address] [phone]

## 2016-03-18 ENCOUNTER — Inpatient Hospital Stay (HOSPITAL_COMMUNITY)
Admission: RE | Admit: 2016-03-18 | Discharge: 2016-03-18 | Disposition: A | Discharge status: Home / Self Care | Payer: BC Managed Care – PPO | Source: Ambulatory Visit

## 2016-03-18 NOTE — Pre-Procedure Instructions (Signed)
    Raven Montoya  03/18/2016      [facility] [address] [place] - [id number] [place] [place] [address] Phone: [phone] Fax: [phone]  [facility] [place], [place] - [address] [place] [address] [place] [address] [place] [address] [place] [address] Phone: [phone] Fax: [phone]  [place], [place] [address] Phone: [phone] Fax: [phone]  [facility] [place], [place] - [address] [place] [address] Phone: [phone] Fax: [phone]  [place], [place] [address] Phone: [phone] Fax: [phone]    Your procedure is scheduled on thurs. Feb. 15  Report to [facility] Admitting at 7:00 A.M.  Call this number if you have problems the morning of surgery:  [phone]   Remember:  Do not eat food or drink liquids after midnight on Wed. Feb. 14     Except : 5:00 AM    drink  Boost   Take these medicines the morning of surgery with A SIP OF WATER : gilenya, sertaline(zoloft),               1 week prior to surgery stop: advil, motrin, ibuprofen, aleve, BC Powders, Goody's, mobic, vitamins/herbal medicines.   Do not wear jewelry, make-up or nail polish.  Do not wear lotions, powders, or perfumes, or deoderant.  Do not shave 48 hours prior to surgery.  Men may shave face and neck.  Do not bring valuables to the hospital.  [facility] is not responsible for any belongings or valuables.  Contacts, dentures or bridgework may not be worn into surgery.  Leave your suitcase in the car.  After surgery it may be brought to your room.  For patients admitted to the hospital, discharge time will be determined by your treatment team.  Patients discharged the day of surgery will  not be allowed to drive home.    Special instructions:  Review preparing for surgery  Please read over the following fact sheets that you were given. Coughing and Deep Breathing

## 2016-03-19 ENCOUNTER — Ambulatory Visit: Payer: Self-pay | Admitting: Plastic Surgery

## 2016-03-19 DIAGNOSIS — C50411 Malignant neoplasm of upper-outer quadrant of right female breast: Secondary | ICD-10-CM

## 2016-03-19 NOTE — H&P (Signed)
Raven Montoya is an 56 y.o. female.   Chief Complaint: right breast cancer HPI: The patient is a 56 y.o. yrs old wf here for consultation for breast reconstruction of the RIGHT breast.  She underwent her yearly mammogram and got a 3D this time.  It showed RIGHT architectural distortion with ultrasound 1.7 cm at the 11:30 position.  The Axilla was negative.  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  The tumor was ER/PR positive, HER-2 not done, Ki-67 2%.  She has a 20 year history of multiple sclerosis.   The patient is thinking about having a partial mastectomy on the RIGHT with reduction at the same time followed by radiation.   Her breasts have been this size for several years.  She complains of the following: Back pain (upper and lower) and neck pain. She frequently pins bra cups higher on straps for better lift and relief. Notices relief when holding breast up in her hands. Shoulder straps causing grooves, pain occasionally requiring padding. Pain medication is sometimes required with motrin and tylenol.  Her breasts are extremely large and fairly symmetric. The sternal to nipple distance on the right is 37 and the left is 35.  The IMF distance is 14 cm.  She is 5 feet 3 inches tall and weighs 229 pounds.  Preoperative bra size = 42DD cup.  The estimated excess breast tissue to be removed at the time of surgery = 650 grams on the right.  Past Medical History:  Diagnosis Date  . Anxiety   . Depression   . Memory loss   . Multiple sclerosis ([facility])    age 21  . Neuropathy ([facility])   . Shingles   . 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 Vision abnormalities     Past Surgical History:  Procedure Laterality Date  . CESAREAN SECTION      Family History  Problem Relation Age of Onset  . Osteoporosis Mother   . Heart disease Mother   . Stroke Mother   . COPD Mother   . Cancer Father     colon  . Heart  disease Brother   . Heart attack Brother    Social History:  reports that she has never smoked. She has never used smokeless tobacco. She reports that she drinks alcohol. She reports that she does not use drugs.  Allergies: No Known Allergies   (Not in a hospital admission)  No results found for this or any previous visit (from the past 48 hour(s)). No results found.  Review of Systems  Constitutional: Negative.   HENT: Negative.   Respiratory: Negative.   Cardiovascular: Negative.   Genitourinary: Negative.   Musculoskeletal: Negative.   Skin: Negative.   Neurological: Negative.   Psychiatric/Behavioral: Negative.     Last menstrual period 12/30/2014. Physical Exam  Constitutional: She is oriented to person, place, and time. She appears well-developed and well-nourished.  HENT:  Head: Normocephalic and atraumatic.  Eyes: Conjunctivae and EOM are normal. Pupils are equal, round, and reactive to light.  Cardiovascular: Normal rate.   Respiratory: Effort normal. No respiratory distress. She has no wheezes.  GI: Soft. She exhibits no distension. There is no tenderness.  Musculoskeletal: Normal range of motion.  Neurological: She is alert and oriented to person, place, and time.  Skin: Skin is warm. No erythema.  Psychiatric: She has a normal mood and affect. Her behavior is normal. Judgment  and thought content normal.     Assessment/Plan A general discussion regarding all available methods of breast reconstruction were discussed. The types of reconstructions described focused on immediate reconstruction at the time of the partial mastectomy.  We would plan to do the reduction of the right.  The left would be later for symmetry. I spoke with Dr. Brantley Stage about this as well.  Wallace Going, DO 03/19/2016, 12:45 PM

## 2016-03-23 ENCOUNTER — Encounter (HOSPITAL_COMMUNITY)
Admission: RE | Admit: 2016-03-23 | Discharge: 2016-03-23 | Disposition: A | Discharge status: Home / Self Care | Payer: BC Managed Care – PPO | Source: Ambulatory Visit | Attending: General Surgery | Admitting: General Surgery

## 2016-03-23 ENCOUNTER — Encounter (HOSPITAL_COMMUNITY): Payer: Self-pay

## 2016-03-23 DIAGNOSIS — D0511 Intraductal carcinoma in situ of right breast: Secondary | ICD-10-CM | POA: Diagnosis not present

## 2016-03-23 DIAGNOSIS — Z170 Estrogen receptor positive status [ER+]: Secondary | ICD-10-CM | POA: Diagnosis not present

## 2016-03-23 DIAGNOSIS — M542 Cervicalgia: Secondary | ICD-10-CM | POA: Diagnosis not present

## 2016-03-23 DIAGNOSIS — R413 Other amnesia: Secondary | ICD-10-CM | POA: Diagnosis not present

## 2016-03-23 DIAGNOSIS — F329 Major depressive disorder, single episode, unspecified: Secondary | ICD-10-CM | POA: Diagnosis not present

## 2016-03-23 DIAGNOSIS — M549 Dorsalgia, unspecified: Secondary | ICD-10-CM | POA: Diagnosis not present

## 2016-03-23 DIAGNOSIS — C50411 Malignant neoplasm of upper-outer quadrant of right female breast: Secondary | ICD-10-CM

## 2016-03-23 DIAGNOSIS — G35 Multiple sclerosis: Secondary | ICD-10-CM | POA: Diagnosis not present

## 2016-03-23 DIAGNOSIS — Z8249 Family history of ischemic heart disease and other diseases of the circulatory system: Secondary | ICD-10-CM | POA: Diagnosis not present

## 2016-03-23 DIAGNOSIS — C50911 Malignant neoplasm of unspecified site of right female breast: Secondary | ICD-10-CM

## 2016-03-23 DIAGNOSIS — Z8262 Family history of osteoporosis: Secondary | ICD-10-CM | POA: Diagnosis not present

## 2016-03-23 DIAGNOSIS — Z8619 Personal history of other infectious and parasitic diseases: Secondary | ICD-10-CM | POA: Diagnosis not present

## 2016-03-23 DIAGNOSIS — F419 Anxiety disorder, unspecified: Secondary | ICD-10-CM | POA: Diagnosis not present

## 2016-03-23 DIAGNOSIS — G629 Polyneuropathy, unspecified: Secondary | ICD-10-CM | POA: Diagnosis not present

## 2016-03-23 DIAGNOSIS — Z823 Family history of stroke: Secondary | ICD-10-CM | POA: Diagnosis not present

## 2016-03-23 DIAGNOSIS — Z800 Family history of malignant neoplasm of digestive organs: Secondary | ICD-10-CM | POA: Diagnosis not present

## 2016-03-23 DIAGNOSIS — Z01812 Encounter for preprocedural laboratory examination: Secondary | ICD-10-CM

## 2016-03-23 DIAGNOSIS — N62 Hypertrophy of breast: Secondary | ICD-10-CM | POA: Diagnosis not present

## 2016-03-23 HISTORY — DX: Anemia, unspecified: D64.9

## 2016-03-23 HISTORY — DX: Malignant (primary) neoplasm, unspecified: C80.1

## 2016-03-23 HISTORY — DX: Presence of spectacles and contact lenses: Z97.3

## 2016-03-23 HISTORY — DX: Unspecified osteoarthritis, unspecified site: M19.90

## 2016-03-23 LAB — COMPREHENSIVE METABOLIC PANEL
ALBUMIN: 4.1 g/dL (ref 3.5–5.0)
ALT: 31 U/L (ref 14–54)
ANION GAP: 8 (ref 5–15)
AST: 33 U/L (ref 15–41)
Alkaline Phosphatase: 66 U/L (ref 38–126)
BUN: 14 mg/dL (ref 6–20)
CO2: 28 mmol/L (ref 22–32)
Calcium: 9.1 mg/dL (ref 8.9–10.3)
Chloride: 105 mmol/L (ref 101–111)
Creatinine, Ser: 0.72 mg/dL (ref 0.44–1.00)
GFR calc Af Amer: >60 mL/min (ref >60)
GFR calc non Af Amer: >60 mL/min (ref >60)
GLUCOSE: 85 mg/dL (ref 65–99)
Potassium: 3.6 mmol/L (ref 3.5–5.1)
Sodium: 141 mmol/L (ref 135–145)
TOTAL PROTEIN: 6.3 g/dL — AB (ref 6.5–8.1)
Total Bilirubin: 0.5 mg/dL (ref 0.3–1.2)

## 2016-03-23 LAB — CBC
HCT: 41.3 % (ref 36.0–46.0)
Hemoglobin: 13.7 g/dL (ref 12.0–15.0)
MCH: 30.8 pg (ref 26.0–34.0)
MCHC: 33.2 g/dL (ref 30.0–36.0)
MCV: 92.8 fL (ref 78.0–100.0)
Platelets: 228 10*3/uL (ref 150–400)
RBC: 4.45 MIL/uL (ref 3.87–5.11)
RDW: 13.7 % (ref 11.5–15.5)
WBC: 4.3 10*3/uL (ref 4.0–10.5)

## 2016-03-23 NOTE — Progress Notes (Signed)
Pt denies SOB, chest pain, and being under the care of a cardiologist. Pt denies having a stress test, echo and cardiac cath. Pt denies having an EKG and chest x ray within the last year. Pt denies having labs within the last 2 weeks.

## 2016-03-23 NOTE — Pre-Procedure Instructions (Signed)
    Raven Montoya  03/23/2016      [facility] [address] [place] - [id number] [place] [address] Phone: [phone] Fax: [phone]  [facility] [place], [place] - [address] [place] [address] [place] [address] [place] [address] Phone: [phone] Fax: [phone]  [place], [place] [address] Phone: [phone] Fax: [phone]  [facility], [place] - [address] [place] [address] Phone: [phone] Fax: [phone]  [facility], [place] [address] Phone: [phone] Fax: [phone]    Your procedure is scheduled on Thursday, March 25, 2016  Report to [facility] at 7:00 A.M.  Call this number if you have problems the morning of surgery:  [phone]   Remember:  Do not eat food or drink liquids after midnight Wednesday, March 24, 2016  Take these medicines the morning of surgery with A SIP OF WATER: GILENYA, sertraline (ZOLOFT) Please complete Boost Breeze drink by 5:00A.M. the morning of surgery Stop taking Aspirin, vitamins, fish oil and herbal medications. Do not take any NSAIDs ie: Ibuprofen, Advil, Naproxen, BC and Goody Powder, meloxicam (MOBIC)  or any medication containing Aspirin; stop now.  Do not wear jewelry, make-up or nail polish.  Do not wear lotions, powders, or perfumes, or deoderant.  Do not shave 48 hours prior to surgery.    Do not bring valuables to the hospital.  [facility] is not responsible for any belongings or valuables.  Contacts, dentures or bridgework may not be worn into surgery.  Leave your suitcase in the car.  After surgery it may be brought to your room.  For patients admitted to the hospital,  discharge time will be determined by your treatment team.  Patients discharged the day of surgery will not be allowed to drive home.  Special instructions: Shower the night before surgery and the morning of surgery with CHG. Please read over the following fact sheets that you were given. Pain Booklet, Coughing and Deep Breathing and Surgical Site Infection Prevention

## 2016-03-24 MED ORDER — DEXTROSE 5 % IV SOLN
3.0000 g | INTRAVENOUS | Status: AC
  Administered 2016-03-25: 3 g via INTRAVENOUS
  Filled 2016-03-24: qty 3000

## 2016-03-25 ENCOUNTER — Encounter (HOSPITAL_COMMUNITY)
Admission: AD | Disposition: A | Discharge status: Home / Self Care | Payer: Self-pay | Source: Ambulatory Visit | Attending: General Surgery

## 2016-03-25 ENCOUNTER — Encounter (HOSPITAL_COMMUNITY): Payer: Self-pay | Admitting: *Deleted

## 2016-03-25 ENCOUNTER — Encounter (HOSPITAL_COMMUNITY)
Admission: RE | Admit: 2016-03-25 | Discharge: 2016-03-25 | Disposition: A | Discharge status: Home / Self Care | Payer: BC Managed Care – PPO | Source: Ambulatory Visit | Attending: Surgery | Admitting: Surgery

## 2016-03-25 ENCOUNTER — Observation Stay (HOSPITAL_COMMUNITY)
Admission: AD | Admit: 2016-03-25 | Discharge: 2016-03-25 | DRG: 581 | Disposition: A | Discharge status: Home / Self Care | Payer: BC Managed Care – PPO | Source: Ambulatory Visit | Attending: Plastic Surgery | Admitting: Plastic Surgery

## 2016-03-25 ENCOUNTER — Ambulatory Visit (HOSPITAL_COMMUNITY): Payer: BC Managed Care – PPO | Admitting: Anesthesiology

## 2016-03-25 DIAGNOSIS — C50919 Malignant neoplasm of unspecified site of unspecified female breast: Secondary | ICD-10-CM | POA: Diagnosis present

## 2016-03-25 DIAGNOSIS — Z170 Estrogen receptor positive status [ER+]: Secondary | ICD-10-CM | POA: Insufficient documentation

## 2016-03-25 DIAGNOSIS — C50411 Malignant neoplasm of upper-outer quadrant of right female breast: Secondary | ICD-10-CM

## 2016-03-25 DIAGNOSIS — Z8249 Family history of ischemic heart disease and other diseases of the circulatory system: Secondary | ICD-10-CM | POA: Insufficient documentation

## 2016-03-25 DIAGNOSIS — D0511 Intraductal carcinoma in situ of right breast: Secondary | ICD-10-CM | POA: Diagnosis not present

## 2016-03-25 DIAGNOSIS — R413 Other amnesia: Secondary | ICD-10-CM | POA: Insufficient documentation

## 2016-03-25 DIAGNOSIS — Z8262 Family history of osteoporosis: Secondary | ICD-10-CM | POA: Insufficient documentation

## 2016-03-25 DIAGNOSIS — M542 Cervicalgia: Secondary | ICD-10-CM | POA: Insufficient documentation

## 2016-03-25 DIAGNOSIS — G35 Multiple sclerosis: Secondary | ICD-10-CM | POA: Insufficient documentation

## 2016-03-25 DIAGNOSIS — F419 Anxiety disorder, unspecified: Secondary | ICD-10-CM | POA: Insufficient documentation

## 2016-03-25 DIAGNOSIS — Z8619 Personal history of other infectious and parasitic diseases: Secondary | ICD-10-CM | POA: Insufficient documentation

## 2016-03-25 DIAGNOSIS — M549 Dorsalgia, unspecified: Secondary | ICD-10-CM | POA: Insufficient documentation

## 2016-03-25 DIAGNOSIS — G629 Polyneuropathy, unspecified: Secondary | ICD-10-CM | POA: Insufficient documentation

## 2016-03-25 DIAGNOSIS — Z823 Family history of stroke: Secondary | ICD-10-CM | POA: Insufficient documentation

## 2016-03-25 DIAGNOSIS — F329 Major depressive disorder, single episode, unspecified: Secondary | ICD-10-CM | POA: Insufficient documentation

## 2016-03-25 DIAGNOSIS — C50911 Malignant neoplasm of unspecified site of right female breast: Secondary | ICD-10-CM

## 2016-03-25 DIAGNOSIS — N62 Hypertrophy of breast: Secondary | ICD-10-CM | POA: Insufficient documentation

## 2016-03-25 DIAGNOSIS — Z800 Family history of malignant neoplasm of digestive organs: Secondary | ICD-10-CM | POA: Insufficient documentation

## 2016-03-25 HISTORY — PX: BREAST RECONSTRUCTION: SHX9

## 2016-03-25 HISTORY — PX: BREAST REDUCTION SURGERY: SHX8

## 2016-03-25 HISTORY — PX: BREAST LUMPECTOMY WITH RADIOACTIVE SEED AND SENTINEL LYMPH NODE BIOPSY: SHX6550

## 2016-03-25 SURGERY — BREAST LUMPECTOMY WITH RADIOACTIVE SEED AND SENTINEL LYMPH NODE BIOPSY
Anesthesia: General | Site: Breast | Laterality: Right

## 2016-03-25 MED ORDER — PHENYLEPHRINE 40 MCG/ML (10ML) SYRINGE FOR IV PUSH (FOR BLOOD PRESSURE SUPPORT)
PREFILLED_SYRINGE | INTRAVENOUS | Status: AC
  Filled 2016-03-25: qty 10

## 2016-03-25 MED ORDER — BUPIVACAINE-EPINEPHRINE 0.5% -1:200000 IJ SOLN
INTRAMUSCULAR | Status: DC | PRN
  Administered 2016-03-25: 30 mL

## 2016-03-25 MED ORDER — DEXTROSE 5 % IV SOLN
INTRAVENOUS | Status: DC | PRN
  Administered 2016-03-25: 25 ug/min via INTRAVENOUS

## 2016-03-25 MED ORDER — ONDANSETRON HCL 4 MG/2ML IJ SOLN
INTRAMUSCULAR | Status: DC | PRN
  Administered 2016-03-25: 4 mg via INTRAVENOUS

## 2016-03-25 MED ORDER — HYDROCODONE-ACETAMINOPHEN 5-325 MG PO TABS: 1.0000 | ORAL_TABLET | ORAL | Status: DC | PRN

## 2016-03-25 MED ORDER — 0.9 % SODIUM CHLORIDE (POUR BTL) OPTIME
TOPICAL | Status: DC | PRN
  Administered 2016-03-25 (×2): 1000 mL

## 2016-03-25 MED ORDER — SODIUM CHLORIDE 0.9 % IV SOLN: 250.0000 mL | INTRAVENOUS | Status: DC | PRN

## 2016-03-25 MED ORDER — POLYMYXIN B SULFATE 500000 UNITS IJ SOLR
INTRAMUSCULAR | Status: DC | PRN
  Administered 2016-03-25: 500 mL

## 2016-03-25 MED ORDER — HYDROMORPHONE HCL 1 MG/ML IJ SOLN: 0.2500 mg | INTRAMUSCULAR | Status: DC | PRN

## 2016-03-25 MED ORDER — ROCURONIUM BROMIDE 10 MG/ML (PF) SYRINGE
PREFILLED_SYRINGE | INTRAVENOUS | Status: DC | PRN
  Administered 2016-03-25: 50 mg via INTRAVENOUS

## 2016-03-25 MED ORDER — HYDROMORPHONE HCL 1 MG/ML IJ SOLN: 1.0000 mg | INTRAMUSCULAR | Status: DC | PRN

## 2016-03-25 MED ORDER — FENTANYL CITRATE (PF) 100 MCG/2ML IJ SOLN
INTRAMUSCULAR | Status: AC
  Filled 2016-03-25: qty 2

## 2016-03-25 MED ORDER — CELECOXIB 200 MG PO CAPS
400.0000 mg | ORAL_CAPSULE | ORAL | Status: AC
  Administered 2016-03-25: 400 mg via ORAL
  Filled 2016-03-25: qty 2

## 2016-03-25 MED ORDER — PROPOFOL 10 MG/ML IV BOLUS
INTRAVENOUS | Status: DC | PRN
  Administered 2016-03-25: 160 mg via INTRAVENOUS

## 2016-03-25 MED ORDER — ONDANSETRON HCL 4 MG/2ML IJ SOLN
INTRAMUSCULAR | Status: AC
  Filled 2016-03-25: qty 2

## 2016-03-25 MED ORDER — SUGAMMADEX SODIUM 200 MG/2ML IV SOLN
INTRAVENOUS | Status: DC | PRN
  Administered 2016-03-25: 200 mg via INTRAVENOUS

## 2016-03-25 MED ORDER — MIDAZOLAM HCL 2 MG/2ML IJ SOLN
2.0000 mg | Freq: Once | INTRAMUSCULAR | Status: AC
  Administered 2016-03-25: 2 mg via INTRAVENOUS
  Filled 2016-03-25: qty 2

## 2016-03-25 MED ORDER — ACETAMINOPHEN 650 MG RE SUPP: 650.0000 mg | RECTAL | Status: DC | PRN

## 2016-03-25 MED ORDER — PHENYLEPHRINE HCL 10 MG/ML IJ SOLN
INTRAMUSCULAR | Status: DC | PRN
  Administered 2016-03-25: 40 ug via INTRAVENOUS

## 2016-03-25 MED ORDER — ACETAMINOPHEN 325 MG PO TABS: 650.0000 mg | ORAL_TABLET | ORAL | Status: DC | PRN

## 2016-03-25 MED ORDER — MIDAZOLAM HCL 2 MG/2ML IJ SOLN
INTRAMUSCULAR | Status: DC | PRN
  Administered 2016-03-25: 1 mg via INTRAVENOUS

## 2016-03-25 MED ORDER — BUPIVACAINE HCL 0.25 % IJ SOLN
INTRAMUSCULAR | Status: DC | PRN
  Administered 2016-03-25: 30 mL

## 2016-03-25 MED ORDER — ALBUTEROL SULFATE HFA 108 (90 BASE) MCG/ACT IN AERS
INHALATION_SPRAY | RESPIRATORY_TRACT | Status: DC | PRN
  Administered 2016-03-25: 4 via RESPIRATORY_TRACT

## 2016-03-25 MED ORDER — BUPIVACAINE HCL (PF) 0.25 % IJ SOLN
INTRAMUSCULAR | Status: AC
  Filled 2016-03-25: qty 30

## 2016-03-25 MED ORDER — OXYCODONE HCL 5 MG PO TABS: 5.0000 mg | ORAL_TABLET | ORAL | Status: DC | PRN

## 2016-03-25 MED ORDER — DIAZEPAM 2 MG PO TABS: 2.0000 mg | ORAL_TABLET | Freq: Two times a day (BID) | ORAL | Status: DC | PRN

## 2016-03-25 MED ORDER — SUGAMMADEX SODIUM 200 MG/2ML IV SOLN
INTRAVENOUS | Status: AC
  Filled 2016-03-25: qty 2

## 2016-03-25 MED ORDER — ACETAMINOPHEN 500 MG PO TABS
1000.0000 mg | ORAL_TABLET | ORAL | Status: AC
  Administered 2016-03-25: 1000 mg via ORAL
  Filled 2016-03-25: qty 2

## 2016-03-25 MED ORDER — ROCURONIUM BROMIDE 50 MG/5ML IV SOSY
PREFILLED_SYRINGE | INTRAVENOUS | Status: AC
  Filled 2016-03-25: qty 5

## 2016-03-25 MED ORDER — SODIUM CHLORIDE 0.9% FLUSH: 3.0000 mL | Freq: Two times a day (BID) | INTRAVENOUS | Status: DC

## 2016-03-25 MED ORDER — SUGAMMADEX SODIUM 200 MG/2ML IV SOLN: INTRAVENOUS | Status: DC | PRN

## 2016-03-25 MED ORDER — LIDOCAINE 2% (20 MG/ML) 5 ML SYRINGE
INTRAMUSCULAR | Status: DC | PRN
  Administered 2016-03-25: 100 mg via INTRAVENOUS

## 2016-03-25 MED ORDER — EPHEDRINE SULFATE 50 MG/ML IJ SOLN
INTRAMUSCULAR | Status: DC | PRN
  Administered 2016-03-25: 5 mg via INTRAVENOUS

## 2016-03-25 MED ORDER — CHLORHEXIDINE GLUCONATE CLOTH 2 % EX PADS: 6.0000 | MEDICATED_PAD | Freq: Once | CUTANEOUS | Status: DC

## 2016-03-25 MED ORDER — LIDOCAINE 2% (20 MG/ML) 5 ML SYRINGE
INTRAMUSCULAR | Status: AC
  Filled 2016-03-25: qty 5

## 2016-03-25 MED ORDER — LACTATED RINGERS IV SOLN
INTRAVENOUS | Status: DC
  Administered 2016-03-25 (×2): via INTRAVENOUS

## 2016-03-25 MED ORDER — FENTANYL CITRATE (PF) 100 MCG/2ML IJ SOLN
INTRAMUSCULAR | Status: DC | PRN
  Administered 2016-03-25 (×3): 50 ug via INTRAVENOUS

## 2016-03-25 MED ORDER — GABAPENTIN 300 MG PO CAPS
300.0000 mg | ORAL_CAPSULE | ORAL | Status: AC
  Administered 2016-03-25: 300 mg via ORAL
  Filled 2016-03-25: qty 1

## 2016-03-25 MED ORDER — PROPOFOL 10 MG/ML IV BOLUS
INTRAVENOUS | Status: AC
  Filled 2016-03-25: qty 20

## 2016-03-25 MED ORDER — FENTANYL CITRATE (PF) 100 MCG/2ML IJ SOLN
100.0000 ug | Freq: Once | INTRAMUSCULAR | Status: AC
  Administered 2016-03-25: 100 ug via INTRAVENOUS
  Filled 2016-03-25: qty 2

## 2016-03-25 MED ORDER — TECHNETIUM TC 99M SULFUR COLLOID FILTERED
1.0000 | Freq: Once | INTRAVENOUS | Status: AC | PRN
  Administered 2016-03-25: 1 via INTRADERMAL

## 2016-03-25 MED ORDER — MIDAZOLAM HCL 2 MG/2ML IJ SOLN
INTRAMUSCULAR | Status: AC
  Administered 2016-03-25: 2 mg via INTRAVENOUS
  Filled 2016-03-25: qty 2

## 2016-03-25 MED ORDER — FENTANYL CITRATE (PF) 100 MCG/2ML IJ SOLN
INTRAMUSCULAR | Status: AC
  Administered 2016-03-25: 100 ug via INTRAVENOUS
  Filled 2016-03-25: qty 2

## 2016-03-25 MED ORDER — SODIUM CHLORIDE 0.9% FLUSH: 3.0000 mL | INTRAVENOUS | Status: DC | PRN

## 2016-03-25 MED ORDER — MIDAZOLAM HCL 2 MG/2ML IJ SOLN
INTRAMUSCULAR | Status: AC
  Filled 2016-03-25: qty 2

## 2016-03-25 SURGICAL SUPPLY — 107 items
ADH SKN CLS APL DERMABOND .7 (GAUZE/BANDAGES/DRESSINGS) ×2
APPLIER CLIP 9.375 MED OPEN (MISCELLANEOUS)
APR CLP MED 9.3 20 MLT OPN (MISCELLANEOUS)
BAG DECANTER FOR FLEXI CONT (MISCELLANEOUS) ×3 IMPLANT
BINDER BREAST LRG (GAUZE/BANDAGES/DRESSINGS) IMPLANT
BINDER BREAST MEDIUM (GAUZE/BANDAGES/DRESSINGS) IMPLANT
BINDER BREAST XLRG (GAUZE/BANDAGES/DRESSINGS) IMPLANT
BINDER BREAST XXLRG (GAUZE/BANDAGES/DRESSINGS) ×2 IMPLANT
BIOPATCH RED 1 DISK 7.0 (GAUZE/BANDAGES/DRESSINGS) ×3 IMPLANT
BIOPATCH RED 1IN DISK 7.0MM (GAUZE/BANDAGES/DRESSINGS) ×1
BLADE 10 SAFETY STRL DISP (BLADE) ×3 IMPLANT
BLADE HEX COATED 2.75 (ELECTRODE) ×3 IMPLANT
BLADE SURG 15 STRL LF DISP TIS (BLADE) ×3 IMPLANT
BLADE SURG 15 STRL SS (BLADE) ×9
BNDG GAUZE ELAST 4 BULKY (GAUZE/BANDAGES/DRESSINGS) ×2 IMPLANT
CANISTER SUCTION 1200CC (MISCELLANEOUS) ×3 IMPLANT
CANISTER SUCTION 2500CC (MISCELLANEOUS) ×4 IMPLANT
CHLORAPREP W/TINT 26ML (MISCELLANEOUS) ×6 IMPLANT
CLIP APPLIE 9.375 MED OPEN (MISCELLANEOUS) ×1 IMPLANT
CONT SPEC 4OZ CLIKSEAL STRL BL (MISCELLANEOUS) ×17 IMPLANT
COVER PROBE W GEL 5X96 (DRAPES) ×3 IMPLANT
COVER SURGICAL LIGHT HANDLE (MISCELLANEOUS) ×6 IMPLANT
DECANTER SPIKE VIAL GLASS SM (MISCELLANEOUS) ×2 IMPLANT
DERMABOND ADVANCED (GAUZE/BANDAGES/DRESSINGS) ×4
DERMABOND ADVANCED .7 DNX12 (GAUZE/BANDAGES/DRESSINGS) ×3 IMPLANT
DEVICE DUBIN SPECIMEN MAMMOGRA (MISCELLANEOUS) ×3 IMPLANT
DRAIN CHANNEL 19F RND (DRAIN) ×1 IMPLANT
DRAPE CHEST BREAST 15X10 FENES (DRAPES) ×3 IMPLANT
DRAPE LAPAROSCOPIC ABDOMINAL (DRAPES) ×3 IMPLANT
DRAPE ORTHO SPLIT 77X108 STRL (DRAPES) ×6
DRAPE PROXIMA HALF (DRAPES) ×2 IMPLANT
DRAPE SURG 17X23 STRL (DRAPES) ×4 IMPLANT
DRAPE SURG ORHT 6 SPLT 77X108 (DRAPES) ×2 IMPLANT
DRAPE UTILITY XL STRL (DRAPES) ×4 IMPLANT
DRAPE WARM FLUID 44X44 (DRAPE) ×1 IMPLANT
DRSG PAD ABDOMINAL 8X10 ST (GAUZE/BANDAGES/DRESSINGS) ×2 IMPLANT
ELECT BLADE 4.0 EZ CLEAN MEGAD (MISCELLANEOUS) ×6
ELECT CAUTERY BLADE 6.4 (BLADE) ×5 IMPLANT
ELECT REM PT RETURN 9FT ADLT (ELECTROSURGICAL) ×3
ELECTRODE BLDE 4.0 EZ CLN MEGD (MISCELLANEOUS) ×1 IMPLANT
ELECTRODE REM PT RTRN 9FT ADLT (ELECTROSURGICAL) ×2 IMPLANT
EVACUATOR SILICONE 100CC (DRAIN) ×1 IMPLANT
GAUZE SPONGE 4X4 12PLY STRL (GAUZE/BANDAGES/DRESSINGS) ×1 IMPLANT
GLOVE BIO SURGEON STRL SZ 6.5 (GLOVE) ×10 IMPLANT
GLOVE BIO SURGEON STRL SZ8 (GLOVE) ×3 IMPLANT
GLOVE BIO SURGEONS STRL SZ 6.5 (GLOVE) ×8
GLOVE BIOGEL PI IND STRL 6.5 (GLOVE) IMPLANT
GLOVE BIOGEL PI IND STRL 7.0 (GLOVE) IMPLANT
GLOVE BIOGEL PI IND STRL 8 (GLOVE) ×1 IMPLANT
GLOVE BIOGEL PI INDICATOR 6.5 (GLOVE) ×2
GLOVE BIOGEL PI INDICATOR 7.0 (GLOVE) ×2
GLOVE BIOGEL PI INDICATOR 8 (GLOVE) ×2
GLOVE SURG SS PI 6.5 STRL IVOR (GLOVE) ×6 IMPLANT
GLOVE SURG SS PI 7.0 STRL IVOR (GLOVE) ×2 IMPLANT
GOWN STRL REUS W/ TWL LRG LVL3 (GOWN DISPOSABLE) ×3 IMPLANT
GOWN STRL REUS W/ TWL XL LVL3 (GOWN DISPOSABLE) ×1 IMPLANT
GOWN STRL REUS W/TWL LRG LVL3 (GOWN DISPOSABLE) ×12
GOWN STRL REUS W/TWL XL LVL3 (GOWN DISPOSABLE) ×3
KIT BASIN OR (CUSTOM PROCEDURE TRAY) ×6 IMPLANT
KIT MARKER MARGIN INK (KITS) ×3 IMPLANT
KIT ROOM TURNOVER OR (KITS) ×3 IMPLANT
LIGHT WAVEGUIDE WIDE FLAT (MISCELLANEOUS) ×3 IMPLANT
MARKER SKIN DUAL TIP RULER LAB (MISCELLANEOUS) ×2 IMPLANT
NDL 18GX1X1/2 (RX/OR ONLY) (NEEDLE) IMPLANT
NDL FILTER BLUNT 18X1 1/2 (NEEDLE) IMPLANT
NDL HYPO 25X1 1.5 SAFETY (NEEDLE) ×1 IMPLANT
NDL SPNL 22GX3.5 QUINCKE BK (NEEDLE) ×1 IMPLANT
NEEDLE 18GX1X1/2 (RX/OR ONLY) (NEEDLE) IMPLANT
NEEDLE FILTER BLUNT 18X 1/2SAF (NEEDLE)
NEEDLE FILTER BLUNT 18X1 1/2 (NEEDLE) IMPLANT
NEEDLE HYPO 25X1 1.5 SAFETY (NEEDLE) IMPLANT
NEEDLE SPNL 22GX3.5 QUINCKE BK (NEEDLE) IMPLANT
NS IRRIG 1000ML POUR BTL (IV SOLUTION) ×5 IMPLANT
PACK GENERAL/GYN (CUSTOM PROCEDURE TRAY) ×5 IMPLANT
PACK SURGICAL SETUP 50X90 (CUSTOM PROCEDURE TRAY) ×1 IMPLANT
PAD ARMBOARD 7.5X6 YLW CONV (MISCELLANEOUS) ×1 IMPLANT
PENCIL BUTTON HOLSTER BLD 10FT (ELECTRODE) ×1 IMPLANT
PIN SAFETY STERILE (MISCELLANEOUS) ×1 IMPLANT
SET ASEPTIC TRANSFER (MISCELLANEOUS) IMPLANT
SPONGE GAUZE 4X4 12PLY STER LF (GAUZE/BANDAGES/DRESSINGS) ×2 IMPLANT
SPONGE LAP 18X18 X RAY DECT (DISPOSABLE) ×5 IMPLANT
STAPLER VISISTAT 35W (STAPLE) IMPLANT
SUT MNCRL AB 3-0 PS2 18 (SUTURE) ×4 IMPLANT
SUT MNCRL AB 4-0 PS2 18 (SUTURE) ×1 IMPLANT
SUT MON AB 4-0 PC3 18 (SUTURE) ×2 IMPLANT
SUT MON AB 5-0 PS2 18 (SUTURE) ×8 IMPLANT
SUT PDS AB 2-0 CT1 27 (SUTURE) IMPLANT
SUT PDS AB 2-0 CT2 27 (SUTURE) IMPLANT
SUT SILK 3 0 PS 1 (SUTURE) IMPLANT
SUT SILK 3 0 SH 30 (SUTURE) ×5 IMPLANT
SUT VIC AB 3-0 SH 18 (SUTURE) ×1 IMPLANT
SUT VIC AB 3-0 SH 27 (SUTURE)
SUT VIC AB 3-0 SH 27X BRD (SUTURE) ×3 IMPLANT
SUT VICRYL 4-0 PS2 18IN ABS (SUTURE) ×10 IMPLANT
SYR 50ML LL SCALE MARK (SYRINGE) IMPLANT
SYR BULB 3OZ (MISCELLANEOUS) ×1 IMPLANT
SYR BULB IRRIGATION 50ML (SYRINGE) ×2 IMPLANT
SYR CONTROL 10ML LL (SYRINGE) ×5 IMPLANT
TOWEL OR 17X24 6PK STRL BLUE (TOWEL DISPOSABLE) ×9 IMPLANT
TOWEL OR 17X26 10 PK STRL BLUE (TOWEL DISPOSABLE) ×6 IMPLANT
TRAY FOLEY CATH 14FRSI W/METER (CATHETERS) IMPLANT
TUBE CONNECTING 12'X1/4 (SUCTIONS) ×1
TUBE CONNECTING 12X1/4 (SUCTIONS) ×2 IMPLANT
TUBE CONNECTING 20'X1/4 (TUBING) ×1
TUBE CONNECTING 20X1/4 (TUBING) ×2 IMPLANT
UNDERPAD 30X30 (UNDERPADS AND DIAPERS) ×2 IMPLANT
YANKAUER SUCT BULB TIP NO VENT (SUCTIONS) ×6 IMPLANT

## 2016-03-25 NOTE — Discharge Instructions (Addendum)
Sink bath No heavy lifting Continue binder or sports bra

## 2016-03-25 NOTE — H&P (View-Only) (Signed)
Raven Montoya is an 55 y.o. female.   Chief Complaint: right breast cancer HPI: The patient is a 55 y.o. yrs old wf here for consultation for breast reconstruction of the RIGHT breast.  She underwent her yearly mammogram and got a 3D this time.  It showed RIGHT architectural distortion with ultrasound 1.7 cm at the 11:30 position.  The Axilla was negative.  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  The tumor was ER/PR positive, HER-2 not done, Ki-67 2%.  She has a 20 year history of multiple sclerosis.   The patient is thinking about having a partial mastectomy on the RIGHT with reduction at the same time followed by radiation.   Her breasts have been this size for several years.  She complains of the following: Back pain (upper and lower) and neck pain. She frequently pins bra cups higher on straps for better lift and relief. Notices relief when holding breast up in her hands. Shoulder straps causing grooves, pain occasionally requiring padding. Pain medication is sometimes required with motrin and tylenol.  Her breasts are extremely large and fairly symmetric. The sternal to nipple distance on the right is 37 and the left is 35.  The IMF distance is 14 cm.  She is 5 feet 3 inches tall and weighs 229 pounds.  Preoperative bra size = 42DD cup.  The estimated excess breast tissue to be removed at the time of surgery = 650 grams on the right.  Past Medical History:  Diagnosis Date  . Anxiety   . Depression   . Memory loss   . Multiple sclerosis ([facility])    age 32  . Neuropathy ([facility])   . Shingles   . 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 Vision abnormalities     Past Surgical History:  Procedure Laterality Date  . CESAREAN SECTION      Family History  Problem Relation Age of Onset  . Osteoporosis Mother   . Heart disease Mother   . Stroke Mother   . COPD Mother   . Cancer Father     colon  . Heart  disease Brother   . Heart attack Brother    Social History:  reports that she has never smoked. She has never used smokeless tobacco. She reports that she drinks alcohol. She reports that she does not use drugs.  Allergies: No Known Allergies   (Not in a hospital admission)  No results found for this or any previous visit (from the past 48 hour(s)). No results found.  Review of Systems  Constitutional: Negative.   HENT: Negative.   Respiratory: Negative.   Cardiovascular: Negative.   Genitourinary: Negative.   Musculoskeletal: Negative.   Skin: Negative.   Neurological: Negative.   Psychiatric/Behavioral: Negative.     Last menstrual period 12/30/2014. Physical Exam  Constitutional: She is oriented to person, place, and time. She appears well-developed and well-nourished.  HENT:  Head: Normocephalic and atraumatic.  Eyes: Conjunctivae and EOM are normal. Pupils are equal, round, and reactive to light.  Cardiovascular: Normal rate.   Respiratory: Effort normal. No respiratory distress. She has no wheezes.  GI: Soft. She exhibits no distension. There is no tenderness.  Musculoskeletal: Normal range of motion.  Neurological: She is alert and oriented to person, place, and time.  Skin: Skin is warm. No erythema.  Psychiatric: She has a normal mood and affect. Her behavior is normal. Judgment   and thought content normal.     Assessment/Plan A general discussion regarding all available methods of breast reconstruction were discussed. The types of reconstructions described focused on immediate reconstruction at the time of the partial mastectomy.  We would plan to do the reduction of the right.  The left would be later for symmetry. I spoke with Dr. Brantley Montoya about this as well.  Raven Going, DO 03/19/2016, 12:45 PM

## 2016-03-25 NOTE — Anesthesia Postprocedure Evaluation (Signed)
Anesthesia Post Note  Patient: JULAYNE SHREINER  Procedure(s) Performed: Procedure(s) (LRB): RIGHT BREAST SEED GUIDED PARTIAL MASTECTOMY WITH RIGHT SENITINEL LYMPH NODE MAPPING (Right) RIGHT BREAST RECONSTRUCTION (Right) MAMMARY REDUCTION  (BREAST) RIGHT (Right)  Patient location during evaluation: PACU Anesthesia Type: General Level of consciousness: awake Pain management: pain level controlled Respiratory status: spontaneous breathing Cardiovascular status: blood pressure returned to baseline Anesthetic complications: no       Last Vitals:  Vitals:   03/25/16 1305 03/25/16 1324  BP: 130/69 125/74  Pulse: 76 76  Resp: 15 16  Temp:      Last Pain:  Vitals:   03/25/16 1324  TempSrc:   PainSc: 0-No pain                 Irma Delancey

## 2016-03-25 NOTE — Op Note (Signed)
Preoperative diagnosis: Stage I right breast cancer  Postoperative diagnosis: Same  Procedure: Right breast seed localized partial mastectomy with right axillary sentinel lymph node mapping deep axillary node  Surgeon: Erroll Luna M.D.  Anesthesia: LMA with pectoral block and local anesthetic  EBL: Minimal  Asst.: Dr. Marla Roe  DO     Drains: None  Indications for procedure: The patient presents for breast conserving surgery for stage I right breast cancer. She will also undergo breast reduction by plastic surgery simultaneously.The procedure has been discussed with the patient. Alternatives to surgery have been discussed with the patient.  Risks of surgery include bleeding,  Infection,  Seroma formation, death,  and the need for further surgery.   The patient understands and wishes to proceed.Sentinel lymph node mapping and dissection has been discussed with the patient.  Risk of bleeding,  Infection,  Seroma formation,  Additional procedures,,  Shoulder weakness ,  Shoulder stiffness,  Nerve and blood vessel injury and reaction to the mapping dyes have been discussed.  Alternatives to surgery have been discussed with the patient.  The patient agrees to proceed.    Description of procedure: The patient was met in the holding area. The right breast marked as correct. She underwent SEED localization and injection of technetium by radiology. Risks, benefits and alternatives to surgery were discussed. The patient was taken back the operating room and placed upon the OR table. After induction of LMA anesthesia, both breasts were prepped and draped in a sterile fashion. Timeout was done. The neoprobe was used and hot spot was identified in the right axilla for sentinel node. 3 cm incision was made after infiltration of the skin of local anesthetic. Dissection was carried down and 1 hot nodes identified and removed. Background counts approached 0. The wound was irrigated and closed with 3-0  Vicryl and 4-0 Monocryl.  Neoprobe was then switched iodine settings. The see was localize right breast upper quadrant. Incision was made with the assistance of plastic surgeon to keep the incision within the breast reduction lines. All tissue around the seating clip were excised. Radiograph revealed the seating clip to be in the specimen. I then shaved the entire cavity and sent these as separate specimens. Hemostasis achieved. At this point plastic surgery took over the case of the breast reduction portion. Please see this. All counts are correct.

## 2016-03-25 NOTE — Transfer of Care (Signed)
Immediate Anesthesia Transfer of Care Note  Patient: Raven Montoya  Procedure(s) Performed: Procedure(s): RIGHT BREAST SEED GUIDED PARTIAL MASTECTOMY WITH RIGHT SENITINEL LYMPH NODE MAPPING (Right) RIGHT BREAST RECONSTRUCTION (Right) MAMMARY REDUCTION  (BREAST) RIGHT (Right)  Patient Location: PACU  Anesthesia Type:GA combined with regional for post-op pain  Level of Consciousness: awake, alert  and patient cooperative  Airway & Oxygen Therapy: Patient Spontanous Breathing  Post-op Assessment: Report given to RN, Post -op Vital signs reviewed and stable and Patient moving all extremities X 4  Post vital signs: Reviewed and stable  Last Vitals:  Vitals:   03/25/16 0825 03/25/16 0830  BP:  133/66  Pulse: 84 89  Resp: 18 19  Temp:      Last Pain:  Vitals:   03/25/16 0708  TempSrc: Oral      Patients Stated Pain Goal: 2 ([id number])  Complications: No apparent anesthesia complications

## 2016-03-25 NOTE — Anesthesia Preprocedure Evaluation (Addendum)
Anesthesia Evaluation  Patient identified by MRN, date of birth, ID band Patient awake  General Assessment Comment:History noted. CG  Reviewed: Allergy & Precautions, NPO status , Patient's Chart, lab work & pertinent test results  Airway Mallampati: II  TM Distance: >3 FB Neck ROM: Full    Dental  (+) Teeth Intact, Dental Advisory Given   Pulmonary neg pulmonary ROS,    breath sounds clear to auscultation       Cardiovascular negative cardio ROS   Rhythm:Regular Rate:Normal     Neuro/Psych    GI/Hepatic negative GI ROS, Neg liver ROS,   Endo/Other  negative endocrine ROS  Renal/GU negative Renal ROS     Musculoskeletal  (+) Arthritis ,   Abdominal   Peds  Hematology  (+) anemia ,   Anesthesia Other Findings   Reproductive/Obstetrics                            Anesthesia Physical Anesthesia Plan  ASA: II  Anesthesia Plan: General   Post-op Pain Management:    Induction: Intravenous  Airway Management Planned: Oral ETT  Additional Equipment:   Intra-op Plan:   Post-operative Plan: Extubation in OR  Informed Consent: I have reviewed the patients History and Physical, chart, labs and discussed the procedure including the risks, benefits and alternatives for the proposed anesthesia with the patient or authorized representative who has indicated his/her understanding and acceptance.   Dental advisory given  Plan Discussed with: CRNA and Anesthesiologist  Anesthesia Plan Comments:         Anesthesia Quick Evaluation

## 2016-03-25 NOTE — Interval H&P Note (Signed)
History and Physical Interval Note:  03/25/2016 8:43 AM  Raven Montoya  has presented today for surgery, with the diagnosis of RIGHT BREAST CANCER  The various methods of treatment have been discussed with the patient and family. After consideration of risks, benefits and other options for treatment, the patient has consented to  Procedure(s): [place] (Right) RIGHT BREAST RECONSTRUCTION (Right) MAMMARY REDUCTION  (BREAST) RIGHT (Right) as a surgical intervention .  The patient's history has been reviewed, patient examined, no change in status, stable for surgery.  I have reviewed the patient's chart and labs.  Questions were answered to the patient's satisfaction.     Wallace Going

## 2016-03-25 NOTE — Anesthesia Procedure Notes (Signed)
Procedure Name: Intubation Date/Time: 03/25/2016 9:39 AM Performed by: Julieta Bellini Pre-anesthesia Checklist: Patient identified, Emergency Drugs available, Suction available, Patient being monitored and Timeout performed Patient Re-evaluated:Patient Re-evaluated prior to inductionOxygen Delivery Method: Circle system utilized Preoxygenation: Pre-oxygenation with 100% oxygen Intubation Type: IV induction Ventilation: Mask ventilation without difficulty Laryngoscope Size: Miller and 2 Grade View: Grade I Tube type: Oral Number of attempts: 1 Airway Equipment and Method: Stylet Placement Confirmation: ETT inserted through vocal cords under direct vision,  positive ETCO2 and breath sounds checked- equal and bilateral Secured at: 22 cm Tube secured with: Tape Dental Injury: Teeth and Oropharynx as per pre-operative assessment  Comments: No view with Mac 3, Miller 2 provided Grade 1 view

## 2016-03-25 NOTE — H&P (Signed)
Pre-op/Pre-procedure Orders Open     [facility]  Erroll Luna, MD  General Surgery   Breast cancer, stage 1, right [facility])  Dx   Additional Documentation   Vitals:   LMP 12/30/2014   Encounter Info:   Billing Info,   History,   Allergies,   Detailed Report     All Notes   H&P by Erroll Luna, MD  Author: Erroll Luna, MD Author Type: Physician Filed: 15M  Note Status: Signed Cosign: Cosign Not Required Encounter Date:   PM  Editor: Erroll Luna, MD (Physician)    [name]  Location: [facility] Surgery Patient #: [id number] DOB: Mar 17, 1960 Undefined / Language: [name] / Race: White Female  History of Present Illness [name] A. Inayah Woodin MD; 39 PM) Patient words: patient sent at the request of Dr.Kinard for mammographic abnormality involving the right breast. This was picked up on screening mammogram and subsequent diagnostic mammogram. A 1.7 cm area of distortion in the right breast upper outer quadrant was noted. 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 This was ER positive, PR positive and HER-2/neu is pending. Proliferation ratio was 2%. Patient denies any historbiof erally.pain, breast mass or nipple discharge bilaterally. She does a history of multiple sclerosis which has been stable.She relates a history of colon, renal prostate cancer in her family.  The patient is a 56 year old female.   Past Surgical History Tawni Pummel, RN; 2 AM) Breast Biopsy Right. Cesarean Section - 1 Foot Surgery Bilateral.  Diagnostic Studies History Tawni Pummel, RN;8 8:02 AM) Colonoscopy within last year Mammogram within last year Pap Smear 1-5 years ago  Medication History Tawni Pummel, RN; 2 AM) Medications Reconciled  Social History Tawni Pummel, RN; 1) Alcohol use Occasional alcohol use. Caffeine use Carbonated beverages, Coffee, Tea. No drug use Tobacco use Never smoker.  Family  History Tawni Pummel, Therapist, sports; ) Arthritis Mother. Cancer Family Members In General. Cerebrovascular Accident Mother. Colon Cancer Father. Colon Polyps Father. Depression Mother. Diabetes Mellitus Family Members In General. Heart Disease Mother. Hypertension Mother. Migraine Headache Mother. Prostate Cancer Father. Respiratory Condition Mother. Thyroid problems Mother.  Pregnancy / Birth History Tawni Pummel, RN; 1 8:02 AM) Age at menarche 54 years. Age of menopause 51-55 Contraceptive History Oral contraceptives. Gravida 2 Maternal age 70-30 Para 1  Other Problems (Tawni Pummel, RN; AM) Anxiety Disorder Arthritis Depression Kidney Stone Lump In Breast     Review of Systems Sunday Spillers Ledford RN;  AM) General Not Present- Appetite Loss, Chills, Fatigue, Fever, Night Sweats, Weight Gain and Weight Loss. Skin Not Present- Change in Wart/Mole, Dryness, Hives, Jaundice, New Lesions, Non-Healing Wounds, Rash and Ulcer. HEENT Present- Visual Disturbances. Not Present- Earache, Hearing Loss, Hoarseness, Nose Bleed, Oral Ulcers, Ringing in the Ears, Seasonal Allergies, Sinus Pain, Sore Throat, Wears glasses/contact lenses and Yellow Eyes. Respiratory Not Present- Bloody sputum, Chronic Cough, Difficulty Breathing, Snoring and Wheezing. Breast Present- Breast Mass. Not Present- Breast Pain, Nipple Discharge and Skin Changes. Cardiovascular Present- Leg Cramps. Not Present- Chest Pain, Difficulty Breathing Lying Down, Palpitations, Rapid Heart Rate, Shortness of Breath and Swelling of Extremities. Gastrointestinal Not Present- Abdominal Pain, Bloating, Bloody Stool, Change in Bowel Habits, Chronic diarrhea, Constipation, Difficulty Swallowing, Excessive gas, Gets full quickly at meals, Hemorrhoids, Indigestion, Nausea, Rectal Pain and Vomiting. Female Genitourinary Not Present- Frequency, Nocturia, Painful Urination, Pelvic Pain and  Urgency. Musculoskeletal Present- Joint Pain, Joint Stiffness, Muscle Pain and Muscle Weakness. Not Present- Back Pain and Swelling of Extremities. Neurological Present-  Trouble walking and Weakness. Not Present- Decreased Memory, Fainting, Headaches, Numbness, Seizures, Tingling and Tremor. Psychiatric Present- Change in Sleep Pattern and Depression. Not Present- Anxiety, Bipolar, Fearful and Frequent crying. Endocrine Present- Hot flashes. Not Present- Cold Intolerance, Excessive Hunger, Hair Changes, Heat Intolerance and New Diabetes. Hematology Not Present- Blood Thinners, Easy Bruising, Excessive bleeding, Gland problems, HIV and Persistent Infections.   Physical Exam (Bernetta Sutley A. Gisel Vipond MD;)  General Mental Status-Alert. General Appearance-Consistent with stated age. Hydration-Well hydrated. Voice-Normal.  Head and Neck Head-normocephalic, atraumatic with no lesions or palpable masses. Trachea-midline. Thyroid Gland Characteristics - normal size and consistency.  Eye Eyeball - Bilateral-Extraocular movements intact. Sclera/Conjunctiva - Bilateral-No scleral icterus.  Chest and Lung Exam Chest and lung exam reveals -quiet, even and easy respiratory effort with no use of accessory muscles and on auscultation, normal breath sounds, no adventitious sounds and normal vocal resonance. Inspection Chest Wall - Normal. Back - normal.  Breast Breast - Left-Symmetric, Non Tender, No Biopsy scars, no Dimpling, No Inflammation, No Lumpectomy scars, No Mastectomy scars, No Peau d' Orange. Breast - Right-Symmetric, Non Tender, No Biopsy scars, no Dimpling, No Inflammation, No Lumpectomy scars, No Mastectomy scars, No Peau d' Orange. Breast Lump-No Palpable Breast Mass.  Neurologic Neurologic evaluation reveals -alert and oriented x 3 with no impairment of recent or remote memory. Mental Status-Normal.  Musculoskeletal Normal Exam - Left-Upper  Extremity Strength Normal and Lower Extremity Strength Normal. Normal Exam - Right-Upper Extremity Strength Normal and Lower Extremity Strength Normal.  Lymphatic Head & Neck  General Head & Neck Lymphatics: Bilateral - Description - Normal. Axillary  General Axillary Region: Bilateral - Description - Normal. Tenderness - Non Tender.    Assessment & Plan (Soni Kegel A. Melodee Lupe MD; )  BREAST CANCER, RIGHT (C50.911) Impression: Discussed breast conservation versus mastectomy. Discussed the pros and cons of each as well as long term expectations. Discussed reconstruction with mastectomy. Patient has opted for right breast seed localized partial mastectomy with sentinel lymph node mapping Risk of lumpectomy include bleeding, infection, seroma, more surgery, use of seed/wire, wound care, cosmetic deformity and the need for other treatments, death , blood clots, death. Pt agrees to proceed. Risk of sentinel lymph node mapping include bleeding, infection, lymphedema, shoulder pain. stiffness, dye allergy. cosmetic deformity , blood clots, death, need for more surgery. Pt agres to proceed.  Current Plans You are being scheduled for surgery- Our schedulers will call you.  You should hear from our office's scheduling department within 5 working days about the location, date, and time of surgery. We try to make accommodations for patient's preferences in scheduling surgery, but sometimes the OR schedule or the surgeon's schedule prevents [place] from making those accommodations.  If you have not heard from our office [phone]) in 5 working days, call the office and ask for your surgeon's nurse.  If you have other questions about your diagnosis, plan, or surgery, call the office and ask for your surgeon's nurse.  Pt Education - [facility] Breast Cancer Information Given - [facility] "Breast Journey" Package We discussed the staging and pathophysiology of breast cancer. We discussed all of the  different options for treatment for breast cancer including surgery, chemotherapy, radiation therapy, Herceptin, and antiestrogen therapy. We discussed a sentinel lymph node biopsy as she does not appear to having lymph node involvement right now. We discussed the performance of that with injection of radioactive tracer and blue dye. We discussed that she would have an incision underneath her axillary hairline. We discussed that there  is a bout a 10-20% chance of having a positive node with a sentinel lymph node biopsy and we will await the permanent pathology to make any other first further decisions in terms of her treatment. One of these options might be to return to the operating room to perform an axillary lymph node dissection. We discussed about a 1-2% risk lifetime of chronic shoulder pain as well as lymphedema associated with a sentinel lymph node biopsy. We discussed the options for treatment of the breast cancer which included lumpectomy versus a mastectomy. We discussed the performance of the lumpectomy with a wire placement. We discussed a 10-20% chance of a positive margin requiring reexcision in the operating room. We also discussed that she may need radiation therapy or antiestrogen therapy or both if she undergoes lumpectomy. We discussed the mastectomy and the postoperative care for that as well. We discussed that there is no difference in her survival whether she undergoes lumpectomy with radiation therapy or antiestrogen therapy versus a mastectomy. There is a slight difference in the local recurrence rate being 3-5% with lumpectomy and about 1% with a mastectomy. We discussed the risks of operation including bleeding, infection, possible reoperation. She understands her further therapy will be based on what her stages at the time of her operation.  Pt Education - [facility] breast cancer surgery: discussed with patient and provided information. Pt Education - [facility] Breast Biopsy HCI: discussed  with patient and provided information. Pt Education - ABC (After Breast Cancer) Class Info: discussed with patient and provided information.

## 2016-03-25 NOTE — Op Note (Addendum)
Breast Reduction Op note:    DATE OF PROCEDURE: 03/25/2016  LOCATION: [facility] Operating Room   SURGEON: Lyndee Leo Sanger Cung Masterson, DO  ASSISTANT: Shawn Rayburn, PA  PREOPERATIVE DIAGNOSIS 1. Right breast cancer 2. Macromastia 3. Neck Pain /  Back Pain  POSTOPERATIVE DIAGNOSIS 1. Right breast cancer 2. Macromastia 3. Neck Pain /  Back Pain  PROCEDURES 1. Right breast reduction.  Right reduction [id number]  COMPLICATIONS: None.  DRAINS: 1 JP  INDICATIONS FOR PROCEDURE Raven Montoya is a 56 y.o. year old female born on [birth year], with right breast cancer and history of symptomatic macromastia with concominant back pain, neck pain, shoulder grooving from her bra.   MRN: [id number]  CONSENT Informed consent was obtained directly from the patient. The risks, benefits and alternatives were fully discussed. Specific risks including but not limited to bleeding, infection, hematoma, seroma, scarring, pain, nipple necrosis, asymmetry, poor cosmetic results, and need for further surgery were discussed. The patient had ample opportunity to have her questions answered to her satisfaction.  DESCRIPTION OF PROCEDURE  Patient was brought into the operating room and placed in a supine position.  SCDs were placed and appropriate padding was performed.  Antibiotics were given. The patient underwent general anesthesia and the chest was prepped and draped in a sterile fashion.  A timeout was performed and all information was confirmed to be correct. General surgery performed their portion of the case which included right SLND and partial mastectomy.   Preoperative markings were confirmed.  Incision lines were injected with 1% Xylocaine with epinephrine.  After waiting for vasoconstriction, the marked lines were incised.  An inferior pedical breast reduction was performed by de-epithelializing the pedicle, using bovie to create the lateral and medial pedicles, and removing breast tissue from the  superior, lateral, and medial portions of the breast.  Care was taken to not undermine the breast pedicle. Hemostasis was achieved.  The nipple was gently rotated into position and the skin was temporarily closed with staples.  The patient was sat upright and size and shape symmetry was confirmed.  The pocket was irrigated, a drain placed, and hemostasis confirmed.  The deep tissues were approximated with 3-0 Monocryl sutures and the skin was closed with deep dermal and subcuticular 4-0 Monocryl sutures followed by 5-0 Monocryl.  The nipple areola complex was brought out with the skin de-epithelialized at the location to make place for the complex.  The area was secured with 4-0 Vicryl at the deep layers followed by 5-0 Monocryl.  The nipple and skin flaps had good capillary refill at the end of the procedure. The patient tolerated the procedure well. The patient was allowed to wake from anesthesia and taken to the recovery room in satisfactory condition.

## 2016-03-25 NOTE — Interval H&P Note (Signed)
History and Physical Interval Note:  03/25/2016 8:51 AM  Raven Montoya  has presented today for surgery, with the diagnosis of RIGHT BREAST CANCER  The various methods of treatment have been discussed with the patient and family. After consideration of risks, benefits and other options for treatment, the patient has consented to  Procedure(s): [place] (Right) RIGHT BREAST RECONSTRUCTION (Right) MAMMARY REDUCTION  (BREAST) RIGHT (Right) as a surgical intervention .  The patient's history has been reviewed, patient examined, no change in status, stable for surgery.  I have reviewed the patient's chart and labs.  Questions were answered to the patient's satisfaction.     Anjalina Bergevin A.

## 2016-03-25 NOTE — Anesthesia Procedure Notes (Signed)
Anesthesia Regional Block:  Pectoralis block  Pre-Anesthetic Checklist: ,, timeout performed, Correct Patient, Correct Site, Correct Laterality, Correct Procedure, Correct Position, site marked, Risks and benefits discussed,  Surgical consent,  Pre-op evaluation,  At surgeon's request and post-op pain management  Laterality: Right  Prep: chloraprep       Needles:   Needle Type: Stimulator Needle - 40          Additional Needles:  Procedures: Doppler guided, ultrasound guided (picture in chart) and nerve stimulator Pectoralis block Narrative:  Start time: 03/25/2016 8:25 AM End time: 03/25/2016 8:40 AM Injection made incrementally with aspirations every 5 mL.  Performed by: Personally  Anesthesiologist: Belinda Block

## 2016-03-26 ENCOUNTER — Encounter (HOSPITAL_COMMUNITY): Payer: Self-pay | Admitting: Surgery

## 2016-03-26 NOTE — Discharge Summary (Signed)
Physician Discharge Summary  Patient ID: KYNSLEIGH MIU MRN: [id number] DOB/AGE: [birth year] 56 y.o.  Admit date: 03/25/2016 Discharge date: 03/26/2016  Admission Diagnoses:  Discharge Diagnoses:  Active Problems:   Breast cancer [facility])   Discharged Condition: good  Hospital Course: Patient had right Breast surgery for treatment of breast cancer followed by Immediate Reconstruction with Reduction  Consults: None  Significant Diagnostic Studies: none  Treatments: surgery  Discharge Exam: Blood pressure 125/74, pulse 76, temperature 97.8 F (36.6 C), resp. rate 16, height 5\' 3"  (1.6 m), weight 104.3 kg (230 lb), last menstrual period 12/30/2014, SpO2 96 %. General appearance: alert, cooperative and no distress Incision/Wound:closed and intact.  Disposition: 01-Home or Self Care   Allergies as of 03/25/2016   No Known Allergies     Medication List    TAKE these medications   amphetamine-dextroamphetamine 30 MG 24 hr capsule Commonly known as:  ADDERALL XR Take 1 capsule (30 mg total) by mouth daily.   CALCIUM CARBONATE PO Take 250 mg by mouth daily.   cholecalciferol 1000 units tablet Commonly known as:  VITAMIN D Take 1,000 Units by mouth daily.   cyclobenzaprine 10 MG tablet Commonly known as:  FLEXERIL Take 1 tablet (10 mg total) by mouth at bedtime as needed. What changed:  reasons to take this   GILENYA 0.5 MG Caps Generic drug:  Fingolimod HCl TAKE 1 CAPSULE BY MOUTH EVERY DAY   IRON PO Take 1 tablet by mouth 2 (two) times a week. occ   meloxicam 7.5 MG tablet Commonly known as:  MOBIC TAKE 1 TO 2 TABLETS BY MOUTH EVERY DAY WITH FOOD AS NEEDED FOR PAIN   sertraline 50 MG tablet Commonly known as:  ZOLOFT Take 1 tablet (50 mg total) by mouth daily.   valACYclovir 1000 MG tablet Commonly known as:  VALTREX TAKE 1/2 TABLET BY MOUTH EVERY DAY What changed:  See the new instructions.   vitamin B-12 1000 MCG tablet Commonly known as:   CYANOCOBALAMIN Take 1,000 mcg by mouth daily.      Follow-up Information    CORNETT,THOMAS A., MD In 2 weeks.   Specialty:  General Surgery Contact information: [place] [address] [phone]        Wallace Going, DO Today.   Specialty:  Plastic Surgery Contact information: [place] [address] [id number]           Signed: Wallace Going 03/26/2016, 3:35 PM

## 2016-03-29 ENCOUNTER — Other Ambulatory Visit: Payer: Self-pay | Admitting: Neurology

## 2016-04-01 ENCOUNTER — Ambulatory Visit: Payer: BC Managed Care – PPO | Admitting: Hematology and Oncology

## 2016-04-01 NOTE — Assessment & Plan Note (Deleted)
03/25/2016: Right lumpectomy: IDC 0.4 cm with DCIS, margins negative, 0/1 lymph node negative, right mammoplasty: Benign, ER 100%, PR 100%, HER-2 negative ratio 1.77, Ki-67 2%, T1a N0 stage IA  Pathology counseling: I discussed the final pathology report of the patient provided  a copy of this report. I discussed the margins as well as lymph node surgeries. We also discussed the final staging along with previously performed ER/PR and HER-2/neu testing.  Treatment plan: 1. Adjuvant radiation therapy 2. followed by adjuvant antiestrogen therapy with letrozole 5 years  Return to clinic towards end of radiation to discuss starting antiestrogen therapy after the conclusion of radiation.

## 2016-04-05 ENCOUNTER — Telehealth: Payer: Self-pay | Admitting: Hematology and Oncology

## 2016-04-05 ENCOUNTER — Encounter: Payer: Self-pay | Admitting: Radiation Oncology

## 2016-04-05 NOTE — Telephone Encounter (Signed)
Faxed disability paperwork to [employer] fax [phone]

## 2016-04-06 ENCOUNTER — Ambulatory Visit: Payer: BC Managed Care – PPO | Admitting: Certified Nurse Midwife

## 2016-04-07 ENCOUNTER — Ambulatory Visit
Admission: RE | Admit: 2016-04-07 | Discharge: 2016-04-07 | Disposition: A | Discharge status: Home / Self Care | Payer: BC Managed Care – PPO | Source: Ambulatory Visit | Attending: Radiation Oncology | Admitting: Radiation Oncology

## 2016-04-07 ENCOUNTER — Telehealth: Payer: Self-pay | Admitting: Oncology

## 2016-04-07 ENCOUNTER — Ambulatory Visit: Payer: BC Managed Care – PPO

## 2016-04-07 ENCOUNTER — Encounter: Payer: Self-pay | Admitting: Radiation Oncology

## 2016-04-07 ENCOUNTER — Telehealth: Payer: Self-pay | Admitting: Hematology and Oncology

## 2016-04-07 DIAGNOSIS — Z510 Encounter for antineoplastic radiation therapy: Secondary | ICD-10-CM | POA: Insufficient documentation

## 2016-04-07 DIAGNOSIS — Z170 Estrogen receptor positive status [ER+]: Secondary | ICD-10-CM | POA: Insufficient documentation

## 2016-04-07 DIAGNOSIS — C50411 Malignant neoplasm of upper-outer quadrant of right female breast: Secondary | ICD-10-CM | POA: Insufficient documentation

## 2016-04-07 NOTE — Telephone Encounter (Signed)
Called patient regarding her appointment with Dr. Sondra Come today.  Requested a return call.

## 2016-04-07 NOTE — Telephone Encounter (Signed)
Pt called to r/s missed appt due to mom passing. Gave pt next available date/time

## 2016-04-09 ENCOUNTER — Telehealth: Payer: Self-pay | Admitting: Hematology and Oncology

## 2016-04-09 ENCOUNTER — Ambulatory Visit (HOSPITAL_BASED_OUTPATIENT_CLINIC_OR_DEPARTMENT_OTHER): Payer: BC Managed Care – PPO | Admitting: Hematology and Oncology

## 2016-04-09 ENCOUNTER — Encounter: Payer: Self-pay | Admitting: Hematology and Oncology

## 2016-04-09 DIAGNOSIS — Z170 Estrogen receptor positive status [ER+]: Secondary | ICD-10-CM | POA: Diagnosis not present

## 2016-04-09 DIAGNOSIS — C50411 Malignant neoplasm of upper-outer quadrant of right female breast: Secondary | ICD-10-CM

## 2016-04-09 DIAGNOSIS — Z79811 Long term (current) use of aromatase inhibitors: Secondary | ICD-10-CM | POA: Diagnosis not present

## 2016-04-09 DIAGNOSIS — D0511 Intraductal carcinoma in situ of right breast: Secondary | ICD-10-CM | POA: Diagnosis not present

## 2016-04-09 NOTE — Telephone Encounter (Signed)
Spoke to and gave copies of disability paperwork completed to patient. Received new set of FMLA paperwork to be completed

## 2016-04-09 NOTE — Progress Notes (Signed)
Patient Care Team: [name], MD as PCP - General (Internal Medicine) Erroll Luna, MD as Consulting Physician (General Surgery) [name], MD as Consulting Physician (Hematology and Oncology) Gery Pray, MD as Consulting Physician (Radiation Oncology)  DIAGNOSIS:  Encounter Diagnosis  Name Primary?  . Malignant neoplasm of upper-outer quadrant of right breast in female, estrogen receptor positive ([place])     SUMMARY OF ONCOLOGIC HISTORY:   Malignant neoplasm of upper-outer quadrant of right breast in female, estrogen receptor positive ([place])   02/17/2016 Initial Diagnosis    Screening detected right breast architectural distortion, lean year density by ultrasound 1.7 cm at the 11:30 position axilla negative. Ultrasound biopsy: Low-grade DCIS with? Microscopic grade 1 invasive ductal carcinoma ER/PR positive HER-2 could not be done Ki-67 2%; T1a N0 stage IA      03/25/2016 Surgery    Right lumpectomy: IDC 0.4 cm with DCIS, margins negative, 0/1 lymph node negative, right mammoplasty: Benign, ER 100%, PR 100%, HER-2 negative ratio 1.77, Ki-67 2%, T1a N0 stage IA       CHIEF COMPLIANT: Follow-up after recent right lumpectomy  INTERVAL HISTORY: Raven Montoya is a 56 year old with above-mentioned history of initially right breast DCIS with questionable microscopic invasion who underwent right lumpectomy on 03/25/2016. Final pathology actually revealed invasive ductal carcinoma. She is recovering very well from the recent surgery and is here today to discuss the results. She was presented last week at the multidisciplinary tumor board. Recently her mother passed away and she had been extremely busy caring for her and taking care of the funeral issues. She has been very stressed out from all of that.  REVIEW OF SYSTEMS:   Constitutional: Denies fevers, chills or abnormal weight loss Eyes: Denies blurriness of vision Ears, nose, mouth, throat, and face: Denies mucositis or sore  throat Respiratory: Denies cough, dyspnea or wheezes Cardiovascular: Denies palpitation, chest discomfort Gastrointestinal:  Denies nausea, heartburn or change in bowel habits Skin: Denies abnormal skin rashes Lymphatics: Denies new lymphadenopathy or easy bruising Neurological:Denies numbness, tingling or new weaknesses Behavioral/Psych: Mood is stable, no new changes  Extremities: No lower extremity edema Breast: Recent right lumpectomy All other systems were reviewed with the patient and are negative.  I have reviewed the past medical history, past surgical history, social history and family history with the patient and they are unchanged from previous note.  ALLERGIES:  has No Known Allergies.  MEDICATIONS:  Current Outpatient Prescriptions  Medication Sig Dispense Refill  . amphetamine-dextroamphetamine (ADDERALL XR) 30 MG 24 hr capsule Take 1 capsule (30 mg total) by mouth daily. 30 capsule 0  . CALCIUM CARBONATE PO Take 250 mg by mouth daily.     . cholecalciferol (VITAMIN D) 1000 units tablet Take 1,000 Units by mouth daily.     . cyclobenzaprine (FLEXERIL) 10 MG tablet Take 1 tablet (10 mg total) by mouth at bedtime as needed. (Patient taking differently: Take 10 mg by mouth at bedtime as needed for muscle spasms. ) 30 tablet 11  . GILENYA 0.5 MG CAPS TAKE 1 CAPSULE BY MOUTH EVERY DAY 90 capsule 2  . IRON PO Take 1 tablet by mouth 2 (two) times a week. occ     . meloxicam (MOBIC) 7.5 MG tablet TAKE 1 TO 2 TABLETS BY MOUTH EVERY DAY WITH FOOD AS NEEDED FOR PAIN 60 tablet 5  . sertraline (ZOLOFT) 50 MG tablet TAKE 1 TABLET(50 MG) BY MOUTH DAILY 30 tablet 0  . valACYclovir (VALTREX) 1000 MG tablet TAKE 1/2  TABLET BY MOUTH EVERY DAY (Patient taking differently: TAKE 1/2 TABLET BY MOUTH EVERY DAY AS NEEDED FOR FEVER BLISTER.) 30 tablet 12  . vitamin B-12 (CYANOCOBALAMIN) 1000 MCG tablet Take 1,000 mcg by mouth daily.      No current facility-administered medications for this visit.     Facility-Administered Medications Ordered in Other Visits  Medication Dose Route Frequency Provider Last Rate Last Dose  . gadopentetate dimeglumine (MAGNEVIST) injection 20 mL  20 mL Intravenous Once PRN Britt Bottom, MD        PHYSICAL EXAMINATION: ECOG PERFORMANCE STATUS: 1 - Symptomatic but completely ambulatory  Vitals:   04/09/16 1141  BP: (!) 154/72  Pulse: 84  Resp: 18  Temp: 97.4 F (36.3 C)   Filed Weights   04/09/16 1141  Weight: 227 lb 1.6 oz (103 kg)    GENERAL:alert, no distress and comfortable SKIN: skin color, texture, turgor are normal, no rashes or significant lesions EYES: normal, Conjunctiva are pink and non-injected, sclera clear OROPHARYNX:no exudate, no erythema and lips, buccal mucosa, and tongue normal  NECK: supple, thyroid normal size, non-tender, without nodularity LYMPH:  no palpable lymphadenopathy in the cervical, axillary or inguinal LUNGS: clear to auscultation and percussion with normal breathing effort HEART: regular rate & rhythm and no murmurs and no lower extremity edema ABDOMEN:abdomen soft, non-tender and normal bowel sounds MUSCULOSKELETAL:no cyanosis of digits and no clubbing  NEURO: alert & oriented x 3 with fluent speech, no focal motor/sensory deficits EXTREMITIES: No lower extremity edema  LABORATORY DATA:  I have reviewed the data as listed   Chemistry      Component Value Date/Time   NA 141 03/23/2016 1503   NA 143 02/18/2016 1247   K 3.6 03/23/2016 1503   K 3.9 02/18/2016 1247   CL 105 03/23/2016 1503   CO2 28 03/23/2016 1503   CO2 27 02/18/2016 1247   BUN 14 03/23/2016 1503   BUN 18.8 02/18/2016 1247   CREATININE 0.72 03/23/2016 1503   CREATININE 0.8 02/18/2016 1247      Component Value Date/Time   CALCIUM 9.1 03/23/2016 1503   CALCIUM 9.5 02/18/2016 1247   ALKPHOS 66 03/23/2016 1503   ALKPHOS 109 02/18/2016 1247   AST 33 03/23/2016 1503   AST 31 02/18/2016 1247   ALT 31 03/23/2016 1503   ALT 45  02/18/2016 1247   BILITOT 0.5 03/23/2016 1503   BILITOT 0.80 02/18/2016 1247       Lab Results  Component Value Date   WBC 4.3 03/23/2016   HGB 13.7 03/23/2016   HCT 41.3 03/23/2016   MCV 92.8 03/23/2016   PLT 228 03/23/2016   NEUTROABS 3.3 02/18/2016    ASSESSMENT & PLAN:  Malignant neoplasm of upper-outer quadrant of right breast in female, estrogen receptor positive ([place]) 03/25/2016: Right lumpectomy: IDC 0.4 cm with DCIS, margins negative, 0/1 lymph node negative, right mammoplasty: Benign, ER 100%, PR 100%, HER-2 negative ratio 1.77, Ki-67 2%, T1a N0 stage IA  Pathology counseling: I discussed the final pathology report of the patient provided  a copy of this report. I discussed the margins as well as lymph node surgeries. We also discussed the final staging along with previously performed ER/PR and HER-2/neu testing.  Treatment plan: 1. Adjuvant radiation therapy 2. followed by adjuvant antiestrogen therapy with letrozole 5 years  Return to clinic towards end of radiation to discuss starting antiestrogen therapy after the conclusion of radiation.    I spent 15 minutes talking to the patient of  which more than half was spent in counseling and coordination of care.  No orders of the defined types were placed in this encounter.  The patient has a good understanding of the overall plan. she agrees with it. she will call with any problems that may develop before the next visit here.   Rulon Eisenmenger, MD 04/09/16

## 2016-04-09 NOTE — Assessment & Plan Note (Signed)
03/25/2016: Right lumpectomy: IDC 0.4 cm with DCIS, margins negative, 0/1 lymph node negative, right mammoplasty: Benign, ER 100%, PR 100%, HER-2 negative ratio 1.77, Ki-67 2%, T1a N0 stage IA  Pathology counseling: I discussed the final pathology report of the patient provided  a copy of this report. I discussed the margins as well as lymph node surgeries. We also discussed the final staging along with previously performed ER/PR and HER-2/neu testing.  Treatment plan: 1. Adjuvant radiation therapy 2. followed by adjuvant antiestrogen therapy with letrozole 5 years  Return to clinic towards end of radiation to discuss starting antiestrogen therapy after the conclusion of radiation.

## 2016-04-16 DIAGNOSIS — Z9889 Other specified postprocedural states: Secondary | ICD-10-CM | POA: Insufficient documentation

## 2016-04-19 ENCOUNTER — Ambulatory Visit: Payer: BC Managed Care – PPO

## 2016-04-19 ENCOUNTER — Ambulatory Visit
Admission: RE | Admit: 2016-04-19 | Discharge: 2016-04-19 | Disposition: A | Discharge status: Home / Self Care | Payer: BC Managed Care – PPO | Source: Ambulatory Visit | Attending: Radiation Oncology | Admitting: Radiation Oncology

## 2016-04-19 NOTE — Progress Notes (Signed)
Location of Breast Cancer: Malignant neoplasm of upper-outer quadrant of right breast   Histology per Pathology Report:   03/25/16 Diagnosis 1. Breast, lumpectomy, Right Breast - INVASIVE AND IN SITU DUCTAL CARCINOMA, 0.4 CM. - MARGIN NOT INVOLVED. - INVASIVE CARCINOMA 0.4 CM FROM ANTERIOR MARGIN. - DUCTAL CARCINOMA IN SITU 0.2 CM FROM ANTERIOR MARGIN. - PREVIOUS BIOPSY SITE. 2. Lymph node, sentinel, biopsy, Right - ONE BENIGN LYMPH NODE (0/1). 3. Breast, excision, Right Breast, Anterior Margin - BENIGN ADIPOSE TISSUE. - NO EVIDENCE OF MALIGNANCY. - FINAL MARGIN CLEAR. 4. Breast, excision, Right Breast, Posterior Margin - BENIGN BREAST TISSUE. - NO EVIDENCE OF MALIGNANCY. - FINAL MARGIN CLEAR. 5. Breast, excision, Right Breast, Medial Margin - BENIGN BREAST TISSUE. - NO EVIDENCE OF MALIGNANCY. - FINAL MARGIN CLEAR. 6. Breast, excision, Right Breast, Lateral Margin - BENIGN BREAST TISSUE. - NO EVIDENCE OF MALIGNANCY. - FINAL MARGIN CLEAR. 7. Breast, excision, Right Breast,Superior Margin - BENIGN BREAST TISSUE. - NO EVIDENCE OF MALIGNANCY. - FINAL MARGIN CLEAR. 8. Breast, excision, Right Breast, Inferior Margin - BENIGN BREAST TISSUE. - NO EVIDENCE OF MALIGNANCY. - FINAL MARGIN CLEAR. 9. Breast, Mammoplasty, right medial pedicle - BENIGN BREAST TISSUE WITH MILD FIBROCYSTIC CHANGES. - NO EVIDENCE OF MALIGNANCY. 10. Breast, Mammoplasty, right superior pedicle - BENIGN BREAST TISSUE WITH MILD FIBROCYSTIC CHANGES. - NO EVIDENCE OF MALIGNANCY. 11. Breast, Mammoplasty, right lateral pedicle - BENIGN BREAST TISSUE. - NO EVIDENCE OF MALIGNANCY.  02/11/16 Diagnosis Breast, right, needle core biopsy - FOCAL INVASIVE DUCTAL CARCINOMA. - DUCTAL CARCINOMA IN SITU. - SEE MICROSCOPIC DESCRIPTION.  Receptor Status: ER(100%), PR (100%), Her2-neu (neg), Ki-(2%)  Did patient present with symptoms (if so, please note symptoms) or was this found on screening mammography?: screening  mammogram  Past/Anticipated interventions by surgeon, if any: 03/25/16 - Procedure: RIGHT BREAST SEED GUIDED PARTIAL MASTECTOMY WITH RIGHT [place];  Surgeon: Erroll Luna, MD, Procedure: MAMMARY REDUCTION  (BREAST) RIGHT;  Surgeon: Wallace Going, DO, Procedure: RIGHT BREAST RECONSTRUCTION;  Surgeon: Loel Lofty Dillingham, DO  Past/Anticipated interventions by medical oncology, if any: antiestrogen therapy with letrozole 5 years  Lymphedema issues, if any:      Pain issues, if any:     OB GYN history: She menarched at early age of 9 and went to menopause at age 46  She had one pregnancy, her first child was born at age 28. She has received birth control pills for approximately 12 years. She was never exposed to fertility medications or hormone replacement therapy. She has no family history of Breast/GYN/GI cancer.  SAFETY ISSUES:  Prior radiation?   Pacemaker/ICD?   Possible current pregnancy?  Is the patient on methotrexate?   Current Complaints / other details:      Jacqulyn Liner, RN 04/19/2016,8:30 AM

## 2016-04-20 ENCOUNTER — Encounter (HOSPITAL_BASED_OUTPATIENT_CLINIC_OR_DEPARTMENT_OTHER): Payer: Self-pay | Admitting: *Deleted

## 2016-04-21 ENCOUNTER — Ambulatory Visit (HOSPITAL_BASED_OUTPATIENT_CLINIC_OR_DEPARTMENT_OTHER): Payer: BC Managed Care – PPO | Admitting: Anesthesiology

## 2016-04-21 ENCOUNTER — Ambulatory Visit (HOSPITAL_BASED_OUTPATIENT_CLINIC_OR_DEPARTMENT_OTHER)
Admission: RE | Admit: 2016-04-21 | Discharge: 2016-04-21 | Disposition: A | Discharge status: Home / Self Care | Payer: BC Managed Care – PPO | Source: Ambulatory Visit | Attending: Plastic Surgery | Admitting: Plastic Surgery

## 2016-04-21 ENCOUNTER — Encounter (HOSPITAL_BASED_OUTPATIENT_CLINIC_OR_DEPARTMENT_OTHER)
Admission: RE | Disposition: A | Discharge status: Home / Self Care | Payer: Self-pay | Source: Ambulatory Visit | Attending: Plastic Surgery

## 2016-04-21 ENCOUNTER — Encounter (HOSPITAL_BASED_OUTPATIENT_CLINIC_OR_DEPARTMENT_OTHER): Payer: Self-pay

## 2016-04-21 DIAGNOSIS — X58XXXA Exposure to other specified factors, initial encounter: Secondary | ICD-10-CM | POA: Diagnosis not present

## 2016-04-21 DIAGNOSIS — Z8249 Family history of ischemic heart disease and other diseases of the circulatory system: Secondary | ICD-10-CM | POA: Diagnosis not present

## 2016-04-21 DIAGNOSIS — G35 Multiple sclerosis: Secondary | ICD-10-CM | POA: Insufficient documentation

## 2016-04-21 DIAGNOSIS — D649 Anemia, unspecified: Secondary | ICD-10-CM | POA: Insufficient documentation

## 2016-04-21 DIAGNOSIS — Z8262 Family history of osteoporosis: Secondary | ICD-10-CM | POA: Insufficient documentation

## 2016-04-21 DIAGNOSIS — G629 Polyneuropathy, unspecified: Secondary | ICD-10-CM | POA: Insufficient documentation

## 2016-04-21 DIAGNOSIS — C50911 Malignant neoplasm of unspecified site of right female breast: Secondary | ICD-10-CM | POA: Insufficient documentation

## 2016-04-21 DIAGNOSIS — Z79899 Other long term (current) drug therapy: Secondary | ICD-10-CM | POA: Insufficient documentation

## 2016-04-21 DIAGNOSIS — F329 Major depressive disorder, single episode, unspecified: Secondary | ICD-10-CM | POA: Insufficient documentation

## 2016-04-21 DIAGNOSIS — F419 Anxiety disorder, unspecified: Secondary | ICD-10-CM | POA: Insufficient documentation

## 2016-04-21 DIAGNOSIS — Z823 Family history of stroke: Secondary | ICD-10-CM | POA: Diagnosis not present

## 2016-04-21 DIAGNOSIS — N641 Fat necrosis of breast: Secondary | ICD-10-CM | POA: Insufficient documentation

## 2016-04-21 DIAGNOSIS — Z825 Family history of asthma and other chronic lower respiratory diseases: Secondary | ICD-10-CM | POA: Insufficient documentation

## 2016-04-21 DIAGNOSIS — T8189XA Other complications of procedures, not elsewhere classified, initial encounter: Secondary | ICD-10-CM | POA: Insufficient documentation

## 2016-04-21 DIAGNOSIS — Z800 Family history of malignant neoplasm of digestive organs: Secondary | ICD-10-CM | POA: Diagnosis not present

## 2016-04-21 DIAGNOSIS — Z8619 Personal history of other infectious and parasitic diseases: Secondary | ICD-10-CM | POA: Diagnosis not present

## 2016-04-21 DIAGNOSIS — R413 Other amnesia: Secondary | ICD-10-CM | POA: Diagnosis not present

## 2016-04-21 HISTORY — PX: INCISION AND DRAINAGE OF WOUND: SHX1803

## 2016-04-21 SURGERY — IRRIGATION AND DEBRIDEMENT WOUND
Anesthesia: General | Site: Breast | Laterality: Right

## 2016-04-21 MED ORDER — PROMETHAZINE HCL 25 MG/ML IJ SOLN: 6.2500 mg | INTRAMUSCULAR | Status: DC | PRN

## 2016-04-21 MED ORDER — MIDAZOLAM HCL 2 MG/2ML IJ SOLN: 0.5000 mg | Freq: Once | INTRAMUSCULAR | Status: DC | PRN

## 2016-04-21 MED ORDER — FENTANYL CITRATE (PF) 100 MCG/2ML IJ SOLN
50.0000 ug | INTRAMUSCULAR | Status: AC | PRN
  Administered 2016-04-21: 25 ug via INTRAVENOUS
  Administered 2016-04-21: 50 ug via INTRAVENOUS
  Administered 2016-04-21: 25 ug via INTRAVENOUS

## 2016-04-21 MED ORDER — SODIUM CHLORIDE 0.9 % IV SOLN: 250.0000 mL | INTRAVENOUS | Status: DC | PRN

## 2016-04-21 MED ORDER — MIDAZOLAM HCL 2 MG/2ML IJ SOLN
INTRAMUSCULAR | Status: AC
  Filled 2016-04-21: qty 2

## 2016-04-21 MED ORDER — FENTANYL CITRATE (PF) 100 MCG/2ML IJ SOLN
INTRAMUSCULAR | Status: AC
  Filled 2016-04-21: qty 2

## 2016-04-21 MED ORDER — SODIUM CHLORIDE 0.9 % IR SOLN
Status: DC | PRN
  Administered 2016-04-21: 500 mL

## 2016-04-21 MED ORDER — OXYCODONE HCL 5 MG PO TABS
ORAL_TABLET | ORAL | Status: AC
  Filled 2016-04-21: qty 1

## 2016-04-21 MED ORDER — LIDOCAINE HCL (CARDIAC) 20 MG/ML IV SOLN
INTRAVENOUS | Status: DC | PRN
  Administered 2016-04-21: 40 mg via INTRAVENOUS

## 2016-04-21 MED ORDER — LACTATED RINGERS IV SOLN
INTRAVENOUS | Status: DC
  Administered 2016-04-21 (×2): via INTRAVENOUS

## 2016-04-21 MED ORDER — SCOPOLAMINE 1 MG/3DAYS TD PT72: 1.0000 | MEDICATED_PATCH | Freq: Once | TRANSDERMAL | Status: DC | PRN

## 2016-04-21 MED ORDER — ONDANSETRON HCL 4 MG/2ML IJ SOLN
INTRAMUSCULAR | Status: DC | PRN
  Administered 2016-04-21: 4 mg via INTRAVENOUS

## 2016-04-21 MED ORDER — CEFAZOLIN SODIUM-DEXTROSE 2-3 GM-% IV SOLR
INTRAVENOUS | Status: DC | PRN
  Administered 2016-04-21: 2 g via INTRAVENOUS

## 2016-04-21 MED ORDER — OXYCODONE HCL 5 MG PO TABS
5.0000 mg | ORAL_TABLET | ORAL | Status: DC | PRN
Start: 2016-04-21 — End: 2016-04-21
  Administered 2016-04-21: 5 mg via ORAL

## 2016-04-21 MED ORDER — MEPERIDINE HCL 25 MG/ML IJ SOLN: 6.2500 mg | INTRAMUSCULAR | Status: DC | PRN

## 2016-04-21 MED ORDER — ACETAMINOPHEN 325 MG PO TABS: 650.0000 mg | ORAL_TABLET | ORAL | Status: DC | PRN

## 2016-04-21 MED ORDER — MIDAZOLAM HCL 2 MG/2ML IJ SOLN
1.0000 mg | INTRAMUSCULAR | Status: DC | PRN
  Administered 2016-04-21: 2 mg via INTRAVENOUS

## 2016-04-21 MED ORDER — ACETAMINOPHEN 650 MG RE SUPP: 650.0000 mg | RECTAL | Status: DC | PRN

## 2016-04-21 MED ORDER — SODIUM CHLORIDE 0.9% FLUSH: 3.0000 mL | INTRAVENOUS | Status: DC | PRN

## 2016-04-21 MED ORDER — PROPOFOL 10 MG/ML IV BOLUS
INTRAVENOUS | Status: DC | PRN
  Administered 2016-04-21: 200 mg via INTRAVENOUS

## 2016-04-21 MED ORDER — SODIUM CHLORIDE 0.9% FLUSH: 3.0000 mL | Freq: Two times a day (BID) | INTRAVENOUS | Status: DC

## 2016-04-21 MED ORDER — FENTANYL CITRATE (PF) 100 MCG/2ML IJ SOLN
25.0000 ug | INTRAMUSCULAR | Status: DC | PRN
  Administered 2016-04-21 (×3): 50 ug via INTRAVENOUS

## 2016-04-21 MED ORDER — CEFAZOLIN SODIUM-DEXTROSE 2-4 GM/100ML-% IV SOLN
INTRAVENOUS | Status: AC
  Filled 2016-04-21: qty 100

## 2016-04-21 MED ORDER — DEXAMETHASONE SODIUM PHOSPHATE 4 MG/ML IJ SOLN
INTRAMUSCULAR | Status: DC | PRN
  Administered 2016-04-21: 10 mg via INTRAVENOUS

## 2016-04-21 SURGICAL SUPPLY — 72 items
ADH SKN CLS APL DERMABOND .7 (GAUZE/BANDAGES/DRESSINGS)
APL SKNCLS STERI-STRIP NONHPOA (GAUZE/BANDAGES/DRESSINGS)
BAG DECANTER FOR FLEXI CONT (MISCELLANEOUS) ×2 IMPLANT
BENZOIN TINCTURE PRP APPL 2/3 (GAUZE/BANDAGES/DRESSINGS) IMPLANT
BLADE HEX COATED 2.75 (ELECTRODE) ×2 IMPLANT
BLADE SURG 10 STRL SS (BLADE) ×2 IMPLANT
BLADE SURG 15 STRL LF DISP TIS (BLADE) ×1 IMPLANT
BLADE SURG 15 STRL SS (BLADE) ×3
CANISTER SUCT 1200ML W/VALVE (MISCELLANEOUS) ×2 IMPLANT
CHLORAPREP W/TINT 26ML (MISCELLANEOUS) IMPLANT
CLOSURE WOUND 1/2 X4 (GAUZE/BANDAGES/DRESSINGS)
COVER BACK TABLE 60X90IN (DRAPES) ×3 IMPLANT
COVER MAYO STAND STRL (DRAPES) ×3 IMPLANT
DECANTER SPIKE VIAL GLASS SM (MISCELLANEOUS) IMPLANT
DERMABOND ADVANCED (GAUZE/BANDAGES/DRESSINGS)
DERMABOND ADVANCED .7 DNX12 (GAUZE/BANDAGES/DRESSINGS) IMPLANT
DRAIN CHANNEL 19F RND (DRAIN) IMPLANT
DRAIN PENROSE 1/2X12 LTX STRL (WOUND CARE) IMPLANT
DRAPE INCISE IOBAN 66X45 STRL (DRAPES) IMPLANT
DRAPE LAPAROSCOPIC ABDOMINAL (DRAPES) IMPLANT
DRAPE LAPAROTOMY 100X72 PEDS (DRAPES) ×2 IMPLANT
DRSG ADAPTIC 3X8 NADH LF (GAUZE/BANDAGES/DRESSINGS) IMPLANT
DRSG EMULSION OIL 3X3 NADH (GAUZE/BANDAGES/DRESSINGS) IMPLANT
DRSG PAD ABDOMINAL 8X10 ST (GAUZE/BANDAGES/DRESSINGS) ×2 IMPLANT
ELECT REM PT RETURN 9FT ADLT (ELECTROSURGICAL) ×3
ELECTRODE REM PT RTRN 9FT ADLT (ELECTROSURGICAL) ×1 IMPLANT
EVACUATOR SILICONE 100CC (DRAIN) IMPLANT
GAUZE SPONGE 4X4 12PLY STRL (GAUZE/BANDAGES/DRESSINGS) ×3 IMPLANT
GAUZE SPONGE 4X4 12PLY STRL LF (GAUZE/BANDAGES/DRESSINGS) IMPLANT
GAUZE XEROFORM 1X8 LF (GAUZE/BANDAGES/DRESSINGS) IMPLANT
GAUZE XEROFORM 5X9 LF (GAUZE/BANDAGES/DRESSINGS) IMPLANT
GLOVE BIO SURGEON STRL SZ 6.5 (GLOVE) ×5 IMPLANT
GLOVE BIO SURGEONS STRL SZ 6.5 (GLOVE) ×3
GLOVE SURG SS PI 7.0 STRL IVOR (GLOVE) ×2 IMPLANT
GOWN STRL REUS W/ TWL LRG LVL3 (GOWN DISPOSABLE) ×2 IMPLANT
GOWN STRL REUS W/TWL LRG LVL3 (GOWN DISPOSABLE) ×9
IV NS IRRIG 3000ML ARTHROMATIC (IV SOLUTION) IMPLANT
MANIFOLD NEPTUNE II (INSTRUMENTS) IMPLANT
NDL HYPO 25X1 1.5 SAFETY (NEEDLE) IMPLANT
NEEDLE HYPO 25X1 1.5 SAFETY (NEEDLE) IMPLANT
NS IRRIG 1000ML POUR BTL (IV SOLUTION) ×1 IMPLANT
PACK BASIN DAY SURGERY FS (CUSTOM PROCEDURE TRAY) ×3 IMPLANT
PENCIL BUTTON HOLSTER BLD 10FT (ELECTRODE) ×3 IMPLANT
PIN SAFETY STERILE (MISCELLANEOUS) IMPLANT
PREVENA INCISION MANAGEMENT SYSTEM ×2 IMPLANT
SHEET MEDIUM DRAPE 40X70 STRL (DRAPES) IMPLANT
SLEEVE SCD COMPRESS KNEE MED (MISCELLANEOUS) ×2 IMPLANT
SPONGE LAP 18X18 X RAY DECT (DISPOSABLE) ×4 IMPLANT
STAPLER VISISTAT 35W (STAPLE) IMPLANT
STRIP CLOSURE SKIN 1/2X4 (GAUZE/BANDAGES/DRESSINGS) IMPLANT
SUCTION FRAZIER HANDLE 10FR (MISCELLANEOUS)
SUCTION TUBE FRAZIER 10FR DISP (MISCELLANEOUS) IMPLANT
SURGILUBE 2OZ TUBE FLIPTOP (MISCELLANEOUS) IMPLANT
SUT MNCRL AB 4-0 PS2 18 (SUTURE) ×5 IMPLANT
SUT MON AB 3-0 SH 27 (SUTURE) ×3
SUT MON AB 3-0 SH27 (SUTURE) IMPLANT
SUT SILK 3 0 PS 1 (SUTURE) IMPLANT
SUT VIC AB 3-0 FS2 27 (SUTURE) IMPLANT
SUT VIC AB 5-0 PS2 18 (SUTURE) IMPLANT
SUT VICRYL 4-0 PS2 18IN ABS (SUTURE) IMPLANT
SWAB COLLECTION DEVICE MRSA (MISCELLANEOUS) IMPLANT
SWAB CULTURE ESWAB REG 1ML (MISCELLANEOUS) IMPLANT
SYR BULB IRRIGATION 50ML (SYRINGE) ×2 IMPLANT
SYR CONTROL 10ML LL (SYRINGE) IMPLANT
TAPE HYPAFIX 6 X30' (GAUZE/BANDAGES/DRESSINGS)
TAPE HYPAFIX 6X30 (GAUZE/BANDAGES/DRESSINGS) IMPLANT
TOWEL OR 17X24 6PK STRL BLUE (TOWEL DISPOSABLE) ×5 IMPLANT
TRAY DSU PREP LF (CUSTOM PROCEDURE TRAY) ×2 IMPLANT
TUBE CONNECTING 20'X1/4 (TUBING) ×1
TUBE CONNECTING 20X1/4 (TUBING) ×1 IMPLANT
UNDERPAD 30X30 (UNDERPADS AND DIAPERS) ×2 IMPLANT
YANKAUER SUCT BULB TIP NO VENT (SUCTIONS) ×2 IMPLANT

## 2016-04-21 NOTE — Anesthesia Preprocedure Evaluation (Signed)
Anesthesia Evaluation  Patient identified by MRN, date of birth, ID band Patient awake    Reviewed: Allergy & Precautions, H&P , Patient's Chart, lab work & pertinent test results, reviewed documented beta blocker date and time   Airway Mallampati: II  TM Distance: >3 FB Neck ROM: full    Dental no notable dental hx.    Pulmonary    Pulmonary exam normal breath sounds clear to auscultation       Cardiovascular  Rhythm:regular Rate:Normal     Neuro/Psych    GI/Hepatic   Endo/Other    Renal/GU      Musculoskeletal   Abdominal   Peds  Hematology   Anesthesia Other Findings MS  Reproductive/Obstetrics                             Anesthesia Physical Anesthesia Plan  ASA: III  Anesthesia Plan: General   Post-op Pain Management:    Induction: Intravenous  Airway Management Planned: LMA  Additional Equipment:   Intra-op Plan:   Post-operative Plan:   Informed Consent: I have reviewed the patients History and Physical, chart, labs and discussed the procedure including the risks, benefits and alternatives for the proposed anesthesia with the patient or authorized representative who has indicated his/her understanding and acceptance.   Dental advisory given  Plan Discussed with: CRNA and Surgeon  Anesthesia Plan Comments: ( Discussed GA with LMA, possible sore throat,dental injury potential need to switch to ETT, N/V, pulmonary aspiration. Questions answered. )        Anesthesia Quick Evaluation

## 2016-04-21 NOTE — Discharge Instructions (Signed)
No heavy lifting Vac on for one week Do not remove     Post Anesthesia Home Care Instructions  Activity: Get plenty of rest for the remainder of the day. A responsible adult should stay with you for 24 hours following the procedure.  For the next 24 hours, DO NOT: -Drive a car -Paediatric nurse -Drink alcoholic beverages -Take any medication unless instructed by your physician -Make any legal decisions or sign important papers.  Meals: Start with liquid foods such as gelatin or soup. Progress to regular foods as tolerated. Avoid greasy, spicy, heavy foods. If nausea and/or vomiting occur, drink only clear liquids until the nausea and/or vomiting subsides. Call your physician if vomiting continues.  Special Instructions/Symptoms: Your throat may feel dry or sore from the anesthesia or the breathing tube placed in your throat during surgery. If this causes discomfort, gargle with warm salt water. The discomfort should disappear within 24 hours.  If you had a scopolamine patch placed behind your ear for the management of post- operative nausea and/or vomiting:  1. The medication in the patch is effective for 72 hours, after which it should be removed.  Wrap patch in a tissue and discard in the trash. Wash hands thoroughly with soap and water. 2. You may remove the patch earlier than 72 hours if you experience unpleasant side effects which may include dry mouth, dizziness or visual disturbances. 3. Avoid touching the patch. Wash your hands with soap and water after contact with the patch.

## 2016-04-21 NOTE — H&P (Signed)
Raven Montoya is an 56 y.o. female.   Chief Complaint: right breast wound HPI: The patient is a 56 yrs old wf here for treatment of a right breast wound.  She underwent an oncoplastic reduction/reconstruction for breast cancer.  She had breakdown on the inferior vertical limb.  She has been using local dressings with slow improvement.  She is needing to start radiation which she can't with a wound.  Past Medical History:  Diagnosis Date  . Anemia   . Anxiety   . Arthritis   . Cancer [facility])    right breast  . Depression   . Memory loss   . Multiple sclerosis ([place])    age 20  . Neuropathy ([place])   . Shingles   . 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 Vision abnormalities   . Wears glasses     Past Surgical History:  Procedure Laterality Date  . ADENOIDECTOMY    . BREAST LUMPECTOMY WITH RADIOACTIVE SEED AND SENTINEL LYMPH NODE BIOPSY Right 03/25/2016   Procedure: RIGHT BREAST SEED GUIDED PARTIAL MASTECTOMY WITH RIGHT SENITINEL LYMPH NODE MAPPING;  Surgeon: Erroll Luna, MD;  Location: [place];  Service: General;  Laterality: Right;  . BREAST RECONSTRUCTION Right 03/25/2016   Procedure: RIGHT BREAST RECONSTRUCTION;  Surgeon: Loel Lofty Janisa Labus, DO;  Location: [place];  Service: Plastics;  Laterality: Right;  . BREAST REDUCTION SURGERY Right 03/25/2016   Procedure: MAMMARY REDUCTION  (BREAST) RIGHT;  Surgeon: Loel Lofty Ocie Stanzione, DO;  Location: [place];  Service: Plastics;  Laterality: Right;  . BUNIONECTOMY    . CESAREAN SECTION    . COLONOSCOPY W/ BIOPSIES AND POLYPECTOMY    . TONSILLECTOMY      Family History  Problem Relation Age of Onset  . Osteoporosis Mother   . Heart disease Mother   . Stroke Mother   . COPD Mother   . Cancer Father     colon  . Heart disease Brother   . Heart attack Brother    Social History:  reports that she has never smoked. She has never used smokeless tobacco. She reports that she drinks alcohol. She reports that she does not use  drugs.  Allergies: No Known Allergies  Medications Prior to Admission  Medication Sig Dispense Refill  . amphetamine-dextroamphetamine (ADDERALL XR) 30 MG 24 hr capsule Take 1 capsule (30 mg total) by mouth daily. 30 capsule 0  . CALCIUM CARBONATE PO Take 250 mg by mouth daily.     . cholecalciferol (VITAMIN D) 1000 units tablet Take 1,000 Units by mouth daily.     . cyclobenzaprine (FLEXERIL) 10 MG tablet Take 1 tablet (10 mg total) by mouth at bedtime as needed. (Patient taking differently: Take 10 mg by mouth at bedtime as needed for muscle spasms. ) 30 tablet 11  . GILENYA 0.5 MG CAPS TAKE 1 CAPSULE BY MOUTH EVERY DAY 90 capsule 2  . IRON PO Take 1 tablet by mouth 2 (two) times a week. occ     . meloxicam (MOBIC) 7.5 MG tablet TAKE 1 TO 2 TABLETS BY MOUTH EVERY DAY WITH FOOD AS NEEDED FOR PAIN 60 tablet 5  . Multiple Vitamin (MULTIVITAMIN) tablet Take 1 tablet by mouth daily.    . sertraline (ZOLOFT) 50 MG tablet TAKE 1 TABLET(50 MG) BY MOUTH DAILY 30 tablet 0  . valACYclovir (VALTREX) 1000 MG tablet TAKE 1/2 TABLET BY MOUTH EVERY DAY (Patient taking differently: TAKE 1/2 TABLET BY MOUTH EVERY DAY AS  NEEDED FOR FEVER BLISTER.) 30 tablet 12  . vitamin B-12 (CYANOCOBALAMIN) 1000 MCG tablet Take 1,000 mcg by mouth daily.       No results found for this or any previous visit (from the past 48 hour(s)). No results found.  Review of Systems  Constitutional: Negative.   HENT: Negative.   Eyes: Negative.   Respiratory: Negative.   Cardiovascular: Negative.   Skin: Negative.     Blood pressure (!) 147/80, pulse 84, temperature 98.1 F (36.7 C), temperature source Oral, resp. rate 18, height 5\' 3"  (1.6 m), weight 103.4 kg (228 lb), last menstrual period 12/30/2014, SpO2 98 %. Physical Exam  Constitutional: She is oriented to person, place, and time. She appears well-developed and well-nourished.  HENT:  Head: Normocephalic and atraumatic.  Eyes: Conjunctivae and EOM are normal. Pupils  are equal, round, and reactive to light.  Cardiovascular: Normal rate.   Respiratory: Effort normal.    GI: Soft. She exhibits no distension.  Neurological: She is alert and oriented to person, place, and time.  Skin: There is erythema.  Psychiatric: She has a normal mood and affect. Her behavior is normal. Judgment and thought content normal.     Assessment/Plan Debridement of right breast with possible closure  Wallace Going, DO 04/21/2016, 11:44 AM

## 2016-04-21 NOTE — Anesthesia Procedure Notes (Signed)
Procedure Name: LMA Insertion Date/Time: 04/21/2016 12:58 PM Performed by: Melynda Ripple D Pre-anesthesia Checklist: Patient identified, Emergency Drugs available, Suction available and Patient being monitored Patient Re-evaluated:Patient Re-evaluated prior to inductionOxygen Delivery Method: Circle system utilized Preoxygenation: Pre-oxygenation with 100% oxygen Intubation Type: IV induction Ventilation: Mask ventilation without difficulty LMA: LMA inserted LMA Size: 4.0 Number of attempts: 1 Airway Equipment and Method: Bite block Placement Confirmation: positive ETCO2 Tube secured with: Tape Dental Injury: Teeth and Oropharynx as per pre-operative assessment

## 2016-04-21 NOTE — Anesthesia Postprocedure Evaluation (Signed)
Anesthesia Post Note  Patient: Raven Montoya  Procedure(s) Performed: Procedure(s) (LRB): IRRIGATION AND DEBRIDEMENT RIGHT BREAST WOUND (Right)  Patient location during evaluation: PACU Anesthesia Type: General Level of consciousness: awake and alert Pain management: pain level controlled Vital Signs Assessment: post-procedure vital signs reviewed and stable Respiratory status: spontaneous breathing, nonlabored ventilation, respiratory function stable and patient connected to nasal cannula oxygen Cardiovascular status: blood pressure returned to baseline and stable Postop Assessment: no signs of nausea or vomiting Anesthetic complications: no       Last Vitals:  Vitals:   04/21/16 1352 04/21/16 1407  BP: 130/75 129/67  Pulse: 90 87  Resp: 15 16  Temp: 36.9 C 36.6 C    Last Pain:  Vitals:   04/21/16 1407  TempSrc: Axillary  PainSc: 6                  Caleigha Zale EDWARD

## 2016-04-21 NOTE — Transfer of Care (Signed)
Immediate Anesthesia Transfer of Care Note  Patient: Raven Montoya  Procedure(s) Performed: Procedure(s): IRRIGATION AND DEBRIDEMENT RIGHT BREAST WOUND (Right)  Patient Location: PACU  Anesthesia Type:General  Level of Consciousness: awake, alert  and oriented  Airway & Oxygen Therapy: Patient Spontanous Breathing and Patient connected to face mask oxygen  Post-op Assessment: Report given to RN and Post -op Vital signs reviewed and stable  Post vital signs: Reviewed and stable  Last Vitals:  Vitals:   04/21/16 1100 04/21/16 1255  BP: (!) 147/80   Pulse: 84 86  Resp: 18 17  Temp: 36.7 C (P) 36.6 C    Last Pain:  Vitals:   04/21/16 1100  TempSrc: Oral         Complications: No apparent anesthesia complications

## 2016-04-21 NOTE — Op Note (Signed)
DATE OF OPERATION: 04/21/2016  LOCATION: [facility]  PREOPERATIVE DIAGNOSIS: Right breast wound  POSTOPERATIVE DIAGNOSIS: Same  PROCEDURE: Excisional debridement of right breast 5 x 7 cm with tissue advancement for closure.  SURGEON: Claire Sanger Dillingham, DO  ASSISTANT: Shawn Rayburn, PA  EBL: 10 cc  CONDITION: Stable  COMPLICATIONS: None  INDICATION: The patient, Raven Montoya, is a 56 y.o. female born on [birth year], is here for treatment of a right breast wound.  She underwent an oncoplasty procedure for right breast cancer.  She had breakdown at the inferior portion of the vertical limb.  She has been caring for the wound with local dressings. There has been slow healing and the patient needs to get started with radiation.  PROCEDURE DETAILS:  The patient was seen prior to surgery and marked.  The IV antibiotics were given. The patient was taken to the operating room and given a general anesthetic. A standard time out was performed and all information was confirmed by those in the room. SCDs were placed.   The chest was prepped and draped in the usual sterile fashion.  The right breast wound was irrigated with antibiotic solution.  The #10 blade was used to debride the 5 x 7 cm wound of fat at the wound bed and skin at the edges.  The bovie was used to obtain hemostasis.  The area was then undermined for 5 cm on the lateral and medial flaps to advance them to the medial aspect of the vertical limb.  The deep layers were closed with 3-0 Monocryl followed by 4-0 Monocryl.  The skin was closed with a running subcuticular closure with 5-0 Monocryl.  The decision was made to place the [facility] on the area.   The patient was allowed to wake up and taken to recovery room in stable condition at the end of the case. The family was notified at the end of the case.

## 2016-04-22 ENCOUNTER — Telehealth: Payer: Self-pay

## 2016-04-22 ENCOUNTER — Encounter (HOSPITAL_BASED_OUTPATIENT_CLINIC_OR_DEPARTMENT_OTHER): Payer: Self-pay | Admitting: Plastic Surgery

## 2016-04-22 NOTE — Telephone Encounter (Signed)
Raven Montoya with [employer] short term disability called to clarify date of disability. Dr Lindi Adie filled out start date as 2/14, her job states she worked that day. Chart review showed her surgery was 2/15. [name] put start date of disability as 2/15.

## 2016-04-26 ENCOUNTER — Telehealth: Payer: Self-pay | Admitting: Neurology

## 2016-04-26 MED ORDER — AMPHETAMINE-DEXTROAMPHET ER 30 MG PO CP24: 30.0000 mg | ORAL_CAPSULE | Freq: Every day | ORAL | 0 refills | Status: DC

## 2016-04-26 NOTE — Telephone Encounter (Signed)
Rx. awaiting RAS sig/fim 

## 2016-04-26 NOTE — Telephone Encounter (Signed)
Pt request refill for amphetamine-dextroamphetamine (ADDERALL XR) 30 MG 24 hr capsule °

## 2016-04-26 NOTE — Telephone Encounter (Signed)
Rx. up front [facility]/fim 

## 2016-05-12 NOTE — Progress Notes (Signed)
Location of Breast Cancer: Malignant neoplasm of upper-outer quadrant of right breast   Histology per Pathology Report:   04/21/16 Diagnosis Soft tissue, debridement, Right Breast SKIN AND SUBCUTANOUS SOFT TISSUE WITH CHRONIC INFLAMMATION FAT NECROSIS NO EVIDENCE OF MALIGNANCY  03/25/16 Diagnosis 1. Breast, lumpectomy, Right Breast - INVASIVE AND IN SITU DUCTAL CARCINOMA, 0.4 CM. - MARGIN NOT INVOLVED. - INVASIVE CARCINOMA 0.4 CM FROM ANTERIOR MARGIN. - DUCTAL CARCINOMA IN SITU 0.2 CM FROM ANTERIOR MARGIN. - PREVIOUS BIOPSY SITE. 2. Lymph node, sentinel, biopsy, Right - ONE BENIGN LYMPH NODE (0/1). 3. Breast, excision, Right Breast, Anterior Margin - BENIGN ADIPOSE TISSUE. - NO EVIDENCE OF MALIGNANCY. - FINAL MARGIN CLEAR. 4. Breast, excision, Right Breast, Posterior Margin - BENIGN BREAST TISSUE. - NO EVIDENCE OF MALIGNANCY. - FINAL MARGIN CLEAR. 5. Breast, excision, Right Breast, Medial Margin - BENIGN BREAST TISSUE. - NO EVIDENCE OF MALIGNANCY. - FINAL MARGIN CLEAR. 6. Breast, excision, Right Breast, Lateral Margin - BENIGN BREAST TISSUE. - NO EVIDENCE OF MALIGNANCY. - FINAL MARGIN CLEAR. 7. Breast, excision, Right Breast,Superior Margin - BENIGN BREAST TISSUE. - NO EVIDENCE OF MALIGNANCY. - FINAL MARGIN CLEAR. 8. Breast, excision, Right Breast, Inferior Margin - BENIGN BREAST TISSUE. - NO EVIDENCE OF MALIGNANCY. - FINAL MARGIN CLEAR. 9. Breast, Mammoplasty, right medial pedicle - BENIGN BREAST TISSUE WITH MILD FIBROCYSTIC CHANGES. - NO EVIDENCE OF MALIGNANCY. 10. Breast, Mammoplasty, right superior pedicle - BENIGN BREAST TISSUE WITH MILD FIBROCYSTIC CHANGES. - NO EVIDENCE OF MALIGNANCY. 11. Breast, Mammoplasty, right lateral pedicle - BENIGN BREAST TISSUE. - NO EVIDENCE OF MALIGNANCY.  02/11/16 Diagnosis Breast, right, needle core biopsy - FOCAL INVASIVE DUCTAL CARCINOMA. - DUCTAL CARCINOMA IN SITU. - SEE MICROSCOPIC DESCRIPTION.  Receptor Status:  ER(100%), PR (100%), Her2-neu (neg), Ki-(2%)  Did patient present with symptoms (if so, please note symptoms) or was this found on screening mammography?: screening mammogram  Past/Anticipated interventions by surgeon, if any: 03/25/16 - Procedure: RIGHT BREAST SEED GUIDED PARTIAL MASTECTOMY WITH RIGHT [place];  Surgeon: Erroll Luna, MD, Procedure: MAMMARY REDUCTION  (BREAST) RIGHT;  Surgeon: Wallace Going, DO, Procedure: RIGHT BREAST RECONSTRUCTION;  Surgeon: Loel Lofty Dillingham, DO  Past/Anticipated interventions by medical oncology, if any: antiestrogen therapy with letrozole 5 years  Lymphedema issues, if any:      Pain issues, if any:     OB GYN history: She menarched at early age of 30 and went to menopause at age 26  She had one pregnancy, her first child was born at age 8. She has received birth control pills for approximately 12 years. She was never exposed to fertility medications or hormone replacement therapy. She has no family history of Breast/GYN/GI cancer.  SAFETY ISSUES:  Prior radiation?   Pacemaker/ICD?   Possible current pregnancy?  Is the patient on methotrexate?   Current Complaints / other details:      Jacqulyn Liner, RN 05/12/2016,12:48 PM

## 2016-05-17 ENCOUNTER — Ambulatory Visit: Payer: BC Managed Care – PPO

## 2016-05-17 ENCOUNTER — Telehealth: Payer: Self-pay | Admitting: Oncology

## 2016-05-17 ENCOUNTER — Ambulatory Visit
Admission: RE | Admit: 2016-05-17 | Discharge: 2016-05-17 | Disposition: A | Discharge status: Home / Self Care | Payer: BC Managed Care – PPO | Source: Ambulatory Visit | Attending: Radiation Oncology | Admitting: Radiation Oncology

## 2016-05-17 NOTE — Telephone Encounter (Signed)
Left a message for patient regarding her appointment with Dr. Kinard today.  Requested a return call. 

## 2016-05-19 ENCOUNTER — Ambulatory Visit
Admission: RE | Admit: 2016-05-19 | Discharge: 2016-05-19 | Disposition: A | Discharge status: Home / Self Care | Payer: BC Managed Care – PPO | Source: Ambulatory Visit | Attending: Radiation Oncology | Admitting: Radiation Oncology

## 2016-05-19 ENCOUNTER — Encounter: Payer: Self-pay | Admitting: Radiation Oncology

## 2016-05-19 ENCOUNTER — Ambulatory Visit: Payer: BC Managed Care – PPO | Admitting: Certified Nurse Midwife

## 2016-05-19 VITALS — BP 142/76 | HR 85 | Temp 97.9°F | Ht 63.0 in | Wt 231.8 lb

## 2016-05-19 DIAGNOSIS — C50411 Malignant neoplasm of upper-outer quadrant of right female breast: Secondary | ICD-10-CM | POA: Diagnosis present

## 2016-05-19 DIAGNOSIS — Z170 Estrogen receptor positive status [ER+]: Secondary | ICD-10-CM | POA: Diagnosis not present

## 2016-05-19 DIAGNOSIS — Z510 Encounter for antineoplastic radiation therapy: Secondary | ICD-10-CM | POA: Diagnosis not present

## 2016-05-19 NOTE — Progress Notes (Signed)
Location of Breast Cancer: Malignant neoplasm of upper-outer quadrant of right breast   Histology per Pathology Report:   04/21/16 Diagnosis Soft tissue, debridement, Right Breast SKIN AND SUBCUTANOUS SOFT TISSUE WITH CHRONIC INFLAMMATION FAT NECROSIS NO EVIDENCE OF MALIGNANCY  03/25/16 Diagnosis 1. Breast, lumpectomy, Right Breast - INVASIVE AND IN SITU DUCTAL CARCINOMA, 0.4 CM. - MARGIN NOT INVOLVED. - INVASIVE CARCINOMA 0.4 CM FROM ANTERIOR MARGIN. - DUCTAL CARCINOMA IN SITU 0.2 CM FROM ANTERIOR MARGIN. - PREVIOUS BIOPSY SITE. 2. Lymph node, sentinel, biopsy, Right - ONE BENIGN LYMPH NODE (0/1). 3. Breast, excision, Right Breast, Anterior Margin - BENIGN ADIPOSE TISSUE. - NO EVIDENCE OF MALIGNANCY. - FINAL MARGIN CLEAR. 4. Breast, excision, Right Breast, Posterior Margin - BENIGN BREAST TISSUE. - NO EVIDENCE OF MALIGNANCY. - FINAL MARGIN CLEAR. 5. Breast, excision, Right Breast, Medial Margin - BENIGN BREAST TISSUE. - NO EVIDENCE OF MALIGNANCY. - FINAL MARGIN CLEAR. 6. Breast, excision, Right Breast, Lateral Margin - BENIGN BREAST TISSUE. - NO EVIDENCE OF MALIGNANCY. - FINAL MARGIN CLEAR. 7. Breast, excision, Right Breast,Superior Margin - BENIGN BREAST TISSUE. - NO EVIDENCE OF MALIGNANCY. - FINAL MARGIN CLEAR. 8. Breast, excision, Right Breast, Inferior Margin - BENIGN BREAST TISSUE. - NO EVIDENCE OF MALIGNANCY. - FINAL MARGIN CLEAR. 9. Breast, Mammoplasty, right medial pedicle - BENIGN BREAST TISSUE WITH MILD FIBROCYSTIC CHANGES. - NO EVIDENCE OF MALIGNANCY. 10. Breast, Mammoplasty, right superior pedicle - BENIGN BREAST TISSUE WITH MILD FIBROCYSTIC CHANGES. - NO EVIDENCE OF MALIGNANCY. 11. Breast, Mammoplasty, right lateral pedicle - BENIGN BREAST TISSUE. - NO EVIDENCE OF MALIGNANCY.  02/11/16 Diagnosis Breast, right, needle core biopsy - FOCAL INVASIVE DUCTAL CARCINOMA. - DUCTAL CARCINOMA IN SITU. - SEE MICROSCOPIC DESCRIPTION.  Receptor Status:  ER(100%), PR (100%), Her2-neu (neg), Ki-(2%)  Did patient present with symptoms (if so, please note symptoms) or was this found on screening mammography?: screening mammogram  Past/Anticipated interventions by surgeon, if any: 03/25/16 - Procedure: RIGHT BREAST SEED GUIDED PARTIAL MASTECTOMY WITH RIGHT [place];  Surgeon: Erroll Luna, MD, Procedure: MAMMARY REDUCTION  (BREAST) RIGHT;  Surgeon: Wallace Going, DO, Procedure: RIGHT BREAST RECONSTRUCTION;  Surgeon: Loel Lofty Dillingham, DO  Past/Anticipated interventions by medical oncology, if any: antiestrogen therapy with letrozole 5 years  Lymphedema issues, if any:  no  Pain issues, if any:  no   OB GYN history: She menarched at early age of 36 and went to menopause at age 29  She had one pregnancy, her first child was born at age 58. She has received birth control pills for approximately 12 years. She was never exposed to fertility medications or hormone replacement therapy. She has no family history of Breast/GYN/GI cancer.  SAFETY ISSUES:  Prior radiation? no  Pacemaker/ICD? no  Possible current pregnancy?no  Is the patient on methotrexate? no  Current Complaints / other details:    BP (!) 142/76 (BP Location: Left Arm, Patient Position: Sitting)   Pulse 85   Temp 97.9 F (36.6 C) (Oral)   Ht '5\' 3"'  (1.6 m)   Wt 231 lb 12.8 oz (105.1 kg)   LMP 12/30/2014   SpO2 100%   BMI 41.06 kg/m    Wt Readings from Last 3 Encounters:  05/19/16 231 lb 12.8 oz (105.1 kg)  04/21/16 228 lb (103.4 kg)  04/09/16 227 lb 1.6 oz (103 kg)      Raven Liner, RN 05/19/2016,1:17 PM

## 2016-05-19 NOTE — Progress Notes (Signed)
Radiation Oncology         [phone] ________________________________  Name: Raven Montoya MRN: [id number]  Date: 05/19/2016  DOB: 09-07-1960  Re-consultation Note  CC: [name], MD  [name], MD    ICD-9-CM ICD-10-CM   1. 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 Since breast clinic on 02/18/16, the patient underwent lumpectomy of the right breast on 03/25/16. This revealed invasive and in situ ductal carcinoma spanning 0.4 cm. There was no margin involvement. There was invasive carcinoma 0.4 cm from the anterior margin and DCIS 0.2 cm from the anterior margin. Sentinel lymph node biopsy showed one benign lymph node (0/1). Excision of the anterior margin, posterior margin, medial margin, lateral margin, superior margin, and inferior margin all show benign breast tissue with no evidence of malignancy. Final margins greater than 1 cm on the invasive and noninvasive component.Right medial pedicle and right superior pedicle mammoplasty showed benign breast tissue with mild fibrocystic changes. Right lateral pedicle mammoplasty showed benign breast tissue. Debridement of soft tissue in the right breast on 04/21/16 showed skin and subcutaneous soft tissue with chronic inflammation and fat necrosis. There was no evidence of malignancy.  The patient followed-up with plastic surgery on 05/18/16 and was cleared for radiation.  Patient is currently on antiestrogen therapy with letrozole x 5 years.  Patient denies lymphedema, breast pain, or nipple discharge at this time.                  ALLERGIES:  is allergic to tape.  Meds: Current Outpatient Prescriptions  Medication Sig Dispense Refill  . amphetamine-dextroamphetamine  (ADDERALL XR) 30 MG 24 hr capsule Take 1 capsule (30 mg total) by mouth daily. 30 capsule 0  . CALCIUM CARBONATE PO Take 250 mg by mouth daily.     . cholecalciferol (VITAMIN D) 1000 units tablet Take 1,000 Units by mouth daily.     . cyclobenzaprine (FLEXERIL) 10 MG tablet Take 1 tablet (10 mg total) by mouth at bedtime as needed. (Patient taking differently: Take 10 mg by mouth at bedtime as needed for muscle spasms. ) 30 tablet 11  . GILENYA 0.5 MG CAPS TAKE 1 CAPSULE BY MOUTH EVERY DAY 90 capsule 2  . IRON PO Take 1 tablet by mouth 2 (two) times a week. occ     . meloxicam (MOBIC) 7.5 MG tablet TAKE 1 TO 2 TABLETS BY MOUTH EVERY DAY WITH FOOD AS NEEDED FOR PAIN 60 tablet 5  . Multiple Vitamin (MULTIVITAMIN) tablet Take 1 tablet by mouth daily.    . sertraline (ZOLOFT) 50 MG tablet TAKE 1 TABLET(50 MG) BY MOUTH DAILY 30 tablet 0  . vitamin B-12 (CYANOCOBALAMIN) 1000 MCG tablet Take 1,000 mcg by mouth daily.     . valACYclovir (VALTREX) 1000 MG tablet TAKE 1/2 TABLET BY MOUTH EVERY DAY (Patient not taking: Reported on 05/19/2016) 30 tablet 12   No current facility-administered medications for this encounter.    Facility-Administered Medications Ordered in Other Encounters  Medication Dose Route Frequency Provider Last Rate Last Dose  . gadopentetate dimeglumine (MAGNEVIST) injection 20 mL  20 mL Intravenous Once PRN Britt Bottom, MD        Physical Findings: The patient is  in no acute distress. Patient is alert and oriented.  height is 5' 3" (1.6 m) and weight is 231 lb 12.8 oz (105.1 kg). Her oral temperature is 97.9 F (36.6 C). Her blood pressure is 142/76 (abnormal) and her pulse is 85. Her oxygen saturation is 100%. .  Lungs are clear to auscultation bilaterally. Heart has regular rate and rhythm. No palpable cervical, supraclavicular, or axillary adenopathy. Abdomen soft, non-tender, normal bowel sounds. Left breast is large and pendulous. There are no palpable masses, nipple  discharge or bleeding. Right breast shows signs of reduction mammoplasty on the inferior breast, with no signs of drainage or infection. The right breast is considerably smaller than the left in light of her surgery.   Lab Findings: Lab Results  Component Value Date   WBC 4.3 03/23/2016   HGB 13.7 03/23/2016   HCT 41.3 03/23/2016   MCV 92.8 03/23/2016   PLT 228 03/23/2016    Radiographic Findings: No results found.  Impression:Stage I (pT1a, pN0) Right Breast UIQ Invasive Ductal Carcinoma with DCIS, ER+ / PR+ / , Grade 1. The patient would be a good candidate for breast conservation therapy with radiation therapy directed at the right breast. I discussed the course of treatment side effects and potential toxicities of treatment with the patient. She appears to understand and wishes to proceed with treatment. A consent form was signed and a copy was placed in the patient's file.  Plan:  CT simulation and treatment planning will be scheduled for 05/24/16 at 1 pm. Anticipate treatment to begin the following week, approximately 6 weeks post-op. The patient will receive approximately 5-1/2 weeks of radiation therapy directed at the right breast. Given the greater than 1 cm surgical margins she will not require a boost to the lumpectomy cavity region.  ____________________________________  This document serves as a record of services personally performed by Gery Pray, MD. It was created on his behalf by Bethann Humble, a trained medical scribe. The creation of this record is based on the scribe's personal observations and the provider's statements to them. This document has been checked and approved by the attending provider.

## 2016-05-24 ENCOUNTER — Ambulatory Visit
Admission: RE | Admit: 2016-05-24 | Discharge: 2016-05-24 | Disposition: A | Discharge status: Home / Self Care | Payer: BC Managed Care – PPO | Source: Ambulatory Visit | Attending: Radiation Oncology | Admitting: Radiation Oncology

## 2016-05-25 ENCOUNTER — Ambulatory Visit
Admission: RE | Admit: 2016-05-25 | Discharge: 2016-05-25 | Disposition: A | Discharge status: Home / Self Care | Payer: BC Managed Care – PPO | Source: Ambulatory Visit | Attending: Radiation Oncology | Admitting: Radiation Oncology

## 2016-05-25 DIAGNOSIS — Z170 Estrogen receptor positive status [ER+]: Principal | ICD-10-CM

## 2016-05-25 DIAGNOSIS — Z510 Encounter for antineoplastic radiation therapy: Secondary | ICD-10-CM | POA: Diagnosis not present

## 2016-05-25 DIAGNOSIS — C50411 Malignant neoplasm of upper-outer quadrant of right female breast: Secondary | ICD-10-CM

## 2016-05-27 ENCOUNTER — Telehealth: Payer: Self-pay | Admitting: Neurology

## 2016-05-27 MED ORDER — AMPHETAMINE-DEXTROAMPHET ER 30 MG PO CP24: 30.0000 mg | ORAL_CAPSULE | Freq: Every day | ORAL | 0 refills | Status: DC

## 2016-05-27 NOTE — Progress Notes (Signed)
  Radiation Oncology         [phone] ________________________________  Name: Raven Montoya MRN: [id number]  Date: 05/25/2016  DOB: [birth year]  SIMULATION AND TREATMENT PLANNING NOTE    ICD-9-CM ICD-10-CM   1. Malignant neoplasm of upper-outer quadrant of right breast in female, estrogen receptor positive ([place]) 174.4 C50.411    V86.0 Z17.0     DIAGNOSIS:  Clinical Stage I RightBreast UIQ Invasive Ductal Carcinoma with DCIS, ER+/ PR+/ Her2 (-), Grade 1  NARRATIVE:  The patient was brought to the [facility].  Identity was confirmed.  All relevant records and images related to the planned course of therapy were reviewed.  The patient freely provided informed written consent to proceed with treatment after reviewing the details related to the planned course of therapy. The consent form was witnessed and verified by the simulation staff.  Then, the patient was set-up in a stable reproducible  supine position for radiation therapy.  CT images were obtained.  Surface markings were placed.  The CT images were loaded into the planning software.  Then the target and avoidance structures were contoured.  Treatment planning then occurred.  The radiation prescription was entered and confirmed.  Then, I designed and supervised the construction of a total of 3 medically necessary complex treatment devices.  I have requested : 3D Simulation  I have requested a DVH of the following structures: heart, lungs, lumpectomy cavity.  I have ordered:dose calc.  PLAN:  The patient will receive 50.4 Gy in 28 fractions. Given the greater than 1 cm surgical margins she will not require a boost to the lumpectomy cavity region.  -----------------------------------  Blair Promise, PhD, MD

## 2016-05-27 NOTE — Telephone Encounter (Signed)
Rx. up front [facility]/fim 

## 2016-05-27 NOTE — Addendum Note (Signed)
Addended by: France Ravens I on: 05/27/2016 12:24 PM   Modules accepted: Orders

## 2016-05-27 NOTE — Telephone Encounter (Signed)
Pt called for refill of amphetamine-dextroamphetamine (ADDERALL XR) 30 MG 24 hr capsule Pt informed office will not be open 05-28-16

## 2016-05-31 DIAGNOSIS — Z510 Encounter for antineoplastic radiation therapy: Secondary | ICD-10-CM | POA: Diagnosis not present

## 2016-06-01 ENCOUNTER — Ambulatory Visit
Admission: RE | Admit: 2016-06-01 | Discharge: 2016-06-01 | Disposition: A | Discharge status: Home / Self Care | Payer: BC Managed Care – PPO | Source: Ambulatory Visit | Attending: Radiation Oncology | Admitting: Radiation Oncology

## 2016-06-01 DIAGNOSIS — Z510 Encounter for antineoplastic radiation therapy: Secondary | ICD-10-CM | POA: Diagnosis not present

## 2016-06-02 ENCOUNTER — Ambulatory Visit
Admission: RE | Admit: 2016-06-02 | Discharge: 2016-06-02 | Disposition: A | Discharge status: Home / Self Care | Payer: BC Managed Care – PPO | Source: Ambulatory Visit | Attending: Radiation Oncology | Admitting: Radiation Oncology

## 2016-06-02 DIAGNOSIS — Z510 Encounter for antineoplastic radiation therapy: Secondary | ICD-10-CM | POA: Diagnosis not present

## 2016-06-03 ENCOUNTER — Ambulatory Visit
Admission: RE | Admit: 2016-06-03 | Discharge: 2016-06-03 | Disposition: A | Discharge status: Home / Self Care | Payer: BC Managed Care – PPO | Source: Ambulatory Visit | Attending: Radiation Oncology | Admitting: Radiation Oncology

## 2016-06-03 DIAGNOSIS — Z510 Encounter for antineoplastic radiation therapy: Secondary | ICD-10-CM | POA: Diagnosis not present

## 2016-06-04 ENCOUNTER — Ambulatory Visit: Payer: BC Managed Care – PPO

## 2016-06-04 ENCOUNTER — Ambulatory Visit
Admission: RE | Admit: 2016-06-04 | Discharge: 2016-06-04 | Disposition: A | Discharge status: Home / Self Care | Payer: BC Managed Care – PPO | Source: Ambulatory Visit | Attending: Radiation Oncology | Admitting: Radiation Oncology

## 2016-06-04 DIAGNOSIS — Z510 Encounter for antineoplastic radiation therapy: Secondary | ICD-10-CM | POA: Diagnosis not present

## 2016-06-07 ENCOUNTER — Ambulatory Visit
Admission: RE | Admit: 2016-06-07 | Discharge: 2016-06-07 | Disposition: A | Discharge status: Home / Self Care | Payer: BC Managed Care – PPO | Source: Ambulatory Visit | Attending: Radiation Oncology | Admitting: Radiation Oncology

## 2016-06-07 DIAGNOSIS — Z510 Encounter for antineoplastic radiation therapy: Secondary | ICD-10-CM | POA: Diagnosis not present

## 2016-06-08 ENCOUNTER — Ambulatory Visit
Admission: RE | Admit: 2016-06-08 | Discharge: 2016-06-08 | Disposition: A | Discharge status: Home / Self Care | Payer: BC Managed Care – PPO | Source: Ambulatory Visit | Attending: Radiation Oncology | Admitting: Radiation Oncology

## 2016-06-08 DIAGNOSIS — C50411 Malignant neoplasm of upper-outer quadrant of right female breast: Secondary | ICD-10-CM

## 2016-06-08 DIAGNOSIS — Z170 Estrogen receptor positive status [ER+]: Principal | ICD-10-CM

## 2016-06-08 DIAGNOSIS — Z510 Encounter for antineoplastic radiation therapy: Secondary | ICD-10-CM | POA: Diagnosis not present

## 2016-06-08 MED ORDER — RADIAPLEXRX EX GEL
Freq: Once | CUTANEOUS | Status: AC
  Administered 2016-06-08: 14:00:00 via TOPICAL

## 2016-06-08 MED ORDER — ALRA NON-METALLIC DEODORANT (RAD-ONC)
1.0000 "application " | Freq: Once | TOPICAL | Status: AC
  Administered 2016-06-08: 1 via TOPICAL

## 2016-06-08 NOTE — Progress Notes (Signed)
Pt here for patient teaching.  Pt given Radiation and You booklet, Alra deodorant and Radiaplex gel.  Reviewed areas of pertinence such as fatigue, skin changes, breast tenderness and breast swelling . Pt able to give teach back of to pat skin and use unscented/gentle soap,apply Radiaplex bid, avoid applying anything to skin within 4 hours of treatment, avoid wearing an under wire bra and to use an electric razor if they must shave. Pt demonstrated understanding and verbalizes understanding of information given and will contact nursing with any questions or concerns.

## 2016-06-09 ENCOUNTER — Ambulatory Visit
Admission: RE | Admit: 2016-06-09 | Discharge: 2016-06-09 | Disposition: A | Discharge status: Home / Self Care | Payer: BC Managed Care – PPO | Source: Ambulatory Visit | Attending: Radiation Oncology | Admitting: Radiation Oncology

## 2016-06-09 DIAGNOSIS — Z510 Encounter for antineoplastic radiation therapy: Secondary | ICD-10-CM | POA: Diagnosis not present

## 2016-06-10 ENCOUNTER — Ambulatory Visit
Admission: RE | Admit: 2016-06-10 | Discharge: 2016-06-10 | Disposition: A | Discharge status: Home / Self Care | Payer: BC Managed Care – PPO | Source: Ambulatory Visit | Attending: Radiation Oncology | Admitting: Radiation Oncology

## 2016-06-10 DIAGNOSIS — Z510 Encounter for antineoplastic radiation therapy: Secondary | ICD-10-CM | POA: Diagnosis not present

## 2016-06-11 ENCOUNTER — Ambulatory Visit
Admission: RE | Admit: 2016-06-11 | Discharge: 2016-06-11 | Disposition: A | Discharge status: Home / Self Care | Payer: BC Managed Care – PPO | Source: Ambulatory Visit | Attending: Radiation Oncology | Admitting: Radiation Oncology

## 2016-06-11 DIAGNOSIS — Z510 Encounter for antineoplastic radiation therapy: Secondary | ICD-10-CM | POA: Diagnosis not present

## 2016-06-14 ENCOUNTER — Ambulatory Visit
Admission: RE | Admit: 2016-06-14 | Discharge: 2016-06-14 | Disposition: A | Discharge status: Home / Self Care | Payer: BC Managed Care – PPO | Source: Ambulatory Visit | Attending: Radiation Oncology | Admitting: Radiation Oncology

## 2016-06-14 DIAGNOSIS — Z510 Encounter for antineoplastic radiation therapy: Secondary | ICD-10-CM | POA: Diagnosis not present

## 2016-06-15 ENCOUNTER — Ambulatory Visit
Admission: RE | Admit: 2016-06-15 | Discharge: 2016-06-15 | Disposition: A | Discharge status: Home / Self Care | Payer: BC Managed Care – PPO | Source: Ambulatory Visit | Attending: Radiation Oncology | Admitting: Radiation Oncology

## 2016-06-15 ENCOUNTER — Telehealth: Payer: Self-pay | Admitting: Neurology

## 2016-06-15 DIAGNOSIS — Z510 Encounter for antineoplastic radiation therapy: Secondary | ICD-10-CM | POA: Diagnosis not present

## 2016-06-15 NOTE — Telephone Encounter (Signed)
Pt requesting a call stating the insurance company is requesting verifcation of pt's meds.  Pt said she was told that the insurance company has faxed forms over with no response.

## 2016-06-15 NOTE — Telephone Encounter (Signed)
I have spoken with Raven Montoya this afternoon.  She sts. [facility] needs a PA for Gilenya.  She has 10 days of med left/fim

## 2016-06-16 ENCOUNTER — Ambulatory Visit
Admission: RE | Admit: 2016-06-16 | Discharge: 2016-06-16 | Disposition: A | Discharge status: Home / Self Care | Payer: BC Managed Care – PPO | Source: Ambulatory Visit | Attending: Radiation Oncology | Admitting: Radiation Oncology

## 2016-06-16 DIAGNOSIS — Z510 Encounter for antineoplastic radiation therapy: Secondary | ICD-10-CM | POA: Diagnosis not present

## 2016-06-16 NOTE — Telephone Encounter (Signed)
Raven Montoya calling from [place] PA# [phone] is not coming thru as vallid, she is asking for a returned call to [place] dept [phone]

## 2016-06-16 NOTE — Telephone Encounter (Signed)
Gilenya PA completed and faxed to [place] fax# [phone]. PA# [phone]

## 2016-06-16 NOTE — Telephone Encounter (Signed)
LMOM that I have completed PA, spoken with [facility] and Gilenya is approved.  I have asked [facility] to contact her to schedule delivery, but she may want to go ahead and call them, as she is down to 12 days of med/fim

## 2016-06-17 ENCOUNTER — Ambulatory Visit
Admission: RE | Admit: 2016-06-17 | Discharge: 2016-06-17 | Disposition: A | Discharge status: Home / Self Care | Payer: BC Managed Care – PPO | Source: Ambulatory Visit | Attending: Radiation Oncology | Admitting: Radiation Oncology

## 2016-06-17 DIAGNOSIS — Z510 Encounter for antineoplastic radiation therapy: Secondary | ICD-10-CM | POA: Diagnosis not present

## 2016-06-17 NOTE — Telephone Encounter (Signed)
Fax received from [place].  Gilenya approved for dates 06/16/16 thru 06/17/18.  PA# [place] [phone] TY./fim

## 2016-06-18 ENCOUNTER — Ambulatory Visit
Admission: RE | Admit: 2016-06-18 | Discharge: 2016-06-18 | Disposition: A | Discharge status: Home / Self Care | Payer: BC Managed Care – PPO | Source: Ambulatory Visit | Attending: Radiation Oncology | Admitting: Radiation Oncology

## 2016-06-18 DIAGNOSIS — Z510 Encounter for antineoplastic radiation therapy: Secondary | ICD-10-CM | POA: Diagnosis not present

## 2016-06-21 ENCOUNTER — Ambulatory Visit
Admission: RE | Admit: 2016-06-21 | Discharge: 2016-06-21 | Disposition: A | Discharge status: Home / Self Care | Payer: BC Managed Care – PPO | Source: Ambulatory Visit | Attending: Radiation Oncology | Admitting: Radiation Oncology

## 2016-06-21 DIAGNOSIS — Z510 Encounter for antineoplastic radiation therapy: Secondary | ICD-10-CM | POA: Diagnosis not present

## 2016-06-22 ENCOUNTER — Ambulatory Visit
Admission: RE | Admit: 2016-06-22 | Discharge: 2016-06-22 | Disposition: A | Discharge status: Home / Self Care | Payer: BC Managed Care – PPO | Source: Ambulatory Visit | Attending: Radiation Oncology | Admitting: Radiation Oncology

## 2016-06-22 DIAGNOSIS — Z510 Encounter for antineoplastic radiation therapy: Secondary | ICD-10-CM | POA: Diagnosis not present

## 2016-06-23 ENCOUNTER — Ambulatory Visit
Admission: RE | Admit: 2016-06-23 | Discharge: 2016-06-23 | Disposition: A | Discharge status: Home / Self Care | Payer: BC Managed Care – PPO | Source: Ambulatory Visit | Attending: Radiation Oncology | Admitting: Radiation Oncology

## 2016-06-23 DIAGNOSIS — Z510 Encounter for antineoplastic radiation therapy: Secondary | ICD-10-CM | POA: Diagnosis not present

## 2016-06-24 ENCOUNTER — Ambulatory Visit
Admission: RE | Admit: 2016-06-24 | Discharge: 2016-06-24 | Disposition: A | Discharge status: Home / Self Care | Payer: BC Managed Care – PPO | Source: Ambulatory Visit | Attending: Radiation Oncology | Admitting: Radiation Oncology

## 2016-06-24 DIAGNOSIS — Z510 Encounter for antineoplastic radiation therapy: Secondary | ICD-10-CM | POA: Diagnosis not present

## 2016-06-25 ENCOUNTER — Telehealth: Payer: Self-pay | Admitting: Oncology

## 2016-06-25 ENCOUNTER — Ambulatory Visit
Admission: RE | Admit: 2016-06-25 | Discharge: 2016-06-25 | Disposition: A | Discharge status: Home / Self Care | Payer: BC Managed Care – PPO | Source: Ambulatory Visit | Attending: Radiation Oncology | Admitting: Radiation Oncology

## 2016-06-25 DIAGNOSIS — Z510 Encounter for antineoplastic radiation therapy: Secondary | ICD-10-CM | POA: Diagnosis not present

## 2016-06-25 NOTE — Telephone Encounter (Signed)
Called Dr. Eusebio Friendly office to see if patient can be seen today for possible cellulitis of her right breast.  Talked to Dr. Eusebio Friendly nurse who will check to see if patient can be seen and will call us back.

## 2016-06-25 NOTE — Telephone Encounter (Signed)
Dr. Eusebio Friendly office called back with an appointment for 1:40 to see the PA.  Patient was advised of the appointment time and verbalized understanding and agreement.

## 2016-06-28 ENCOUNTER — Ambulatory Visit: Payer: BC Managed Care – PPO

## 2016-06-28 ENCOUNTER — Telehealth: Payer: Self-pay | Admitting: Oncology

## 2016-06-28 NOTE — Telephone Encounter (Addendum)
Patient called back.  She said her skin is still red but the area is smaller.  She is taking Cipro and will see Dr. Eusebio Friendly PA tomorrow.  Advised her that Dr. Sondra Come recommended canceling treatment today and tomorrow and to let us know after her appointment tomorrow if she can restart radiation.  She verbalized understanding and agreement.  Notified Melissa, RTT on [place] [facility] of cancellations.

## 2016-06-28 NOTE — Telephone Encounter (Signed)
Left a message for patient to discuss treatment today.  Requested a return call.

## 2016-06-29 ENCOUNTER — Ambulatory Visit: Payer: BC Managed Care – PPO

## 2016-06-29 ENCOUNTER — Telehealth: Payer: Self-pay | Admitting: Oncology

## 2016-06-29 NOTE — Telephone Encounter (Addendum)
Patient called and said she saw the PA at Dr. Eusebio Friendly office today.  She said the skin on her right abdomen is pink.  She will be on antibiotics until Friday.  She said Dr. Eusebio Friendly office said it was OK for her to have radiation if it is OK with Dr. Sondra Come.  Left a message for patient asking if she can come in before treatment tomorrow at 12:30 for a skin check.  Requested a return call.

## 2016-06-30 ENCOUNTER — Encounter: Payer: Self-pay | Admitting: Radiation Oncology

## 2016-06-30 ENCOUNTER — Ambulatory Visit
Admission: RE | Admit: 2016-06-30 | Discharge: 2016-06-30 | Disposition: A | Discharge status: Home / Self Care | Payer: BC Managed Care – PPO | Source: Ambulatory Visit | Attending: Radiation Oncology | Admitting: Radiation Oncology

## 2016-06-30 VITALS — BP 158/84 | HR 97 | Temp 98.2°F | Ht 63.0 in | Wt 235.2 lb

## 2016-06-30 DIAGNOSIS — Z510 Encounter for antineoplastic radiation therapy: Secondary | ICD-10-CM | POA: Diagnosis not present

## 2016-06-30 DIAGNOSIS — C50411 Malignant neoplasm of upper-outer quadrant of right female breast: Secondary | ICD-10-CM

## 2016-06-30 DIAGNOSIS — Z170 Estrogen receptor positive status [ER+]: Principal | ICD-10-CM

## 2016-06-30 NOTE — Progress Notes (Signed)
  Radiation Oncology         [phone] ________________________________  Name: Raven Montoya MRN: [id number]  Date: 06/30/2016  DOB: 08-12-1960  Weekly Radiation Therapy Management    ICD-9-CM ICD-10-CM   1. Malignant neoplasm of upper-outer quadrant of right breast in female, estrogen receptor positive ([facility]) 174.4 C50.411    V86.0 Z17.0      Current Dose: 34.2 Gy     Planned Dose:  50.4 Gy  Narrative . . . . . . . . The patient presents for routine under treatment assessment Prior to restarting her radiation therapy. The patient is been on break in light of her cellulitis involving her upper abdominal region and right breast region. She is tolerating Cipro well. Plastic surgery is okay restarting her treatment.                                                                     Set-up films were reviewed.                                 The chart was checked. Physical Findings. . .  height is 5\' 3"  (1.6 m) and weight is 235 lb 3.2 oz (106.7 kg). Her oral temperature is 98.2 F (36.8 C). Her blood pressure is 158/84 (abnormal) and her pulse is 97. Her oxygen saturation is 100%. .  The lungs are clear For some mild wheezing bilaterally. The heart has regular rhythm and rate. Examination of the right breast reveals erythema. No skin breakdown is appreciated.  Impression . . . . . . . The patient is tolerating radiation. Plan . . . . . . . . . . . . The patient will resume her radiation therapy  ________________________________   Blair Promise, PhD, MD

## 2016-06-30 NOTE — Progress Notes (Signed)
Raven Montoya has completed 18 fractions to her right breast. She denies having pain.  She is taking Cipro until Friday.  She denies having an increase in fatigue.  The skin on her right abdomen is slightly pink.  The skin on her right breast is red.  She is using radiaplex.  BP (!) 158/84 (BP Location: Left Arm, Patient Position: Sitting)   Pulse 97   Temp 98.2 F (36.8 C) (Oral)   Ht 5\' 3"  (1.6 m)   Wt 235 lb 3.2 oz (106.7 kg)   LMP 12/30/2014   SpO2 100%   BMI 41.66 kg/m    Wt Readings from Last 3 Encounters:  06/30/16 235 lb 3.2 oz (106.7 kg)  05/19/16 231 lb 12.8 oz (105.1 kg)  04/21/16 228 lb (103.4 kg)

## 2016-07-01 ENCOUNTER — Ambulatory Visit
Admission: RE | Admit: 2016-07-01 | Discharge: 2016-07-01 | Disposition: A | Discharge status: Home / Self Care | Payer: BC Managed Care – PPO | Source: Ambulatory Visit | Attending: Radiation Oncology | Admitting: Radiation Oncology

## 2016-07-01 DIAGNOSIS — Z510 Encounter for antineoplastic radiation therapy: Secondary | ICD-10-CM | POA: Diagnosis not present

## 2016-07-02 ENCOUNTER — Ambulatory Visit
Admission: RE | Admit: 2016-07-02 | Discharge: 2016-07-02 | Disposition: A | Discharge status: Home / Self Care | Payer: BC Managed Care – PPO | Source: Ambulatory Visit | Attending: Radiation Oncology | Admitting: Radiation Oncology

## 2016-07-02 DIAGNOSIS — Z510 Encounter for antineoplastic radiation therapy: Secondary | ICD-10-CM | POA: Diagnosis not present

## 2016-07-06 ENCOUNTER — Ambulatory Visit
Admission: RE | Admit: 2016-07-06 | Discharge: 2016-07-06 | Disposition: A | Discharge status: Home / Self Care | Payer: BC Managed Care – PPO | Source: Ambulatory Visit | Attending: Radiation Oncology | Admitting: Radiation Oncology

## 2016-07-06 ENCOUNTER — Telehealth: Payer: Self-pay | Admitting: Neurology

## 2016-07-06 VITALS — BP 143/87 | HR 91 | Temp 97.9°F | Resp 18 | Wt 233.8 lb

## 2016-07-06 DIAGNOSIS — Z510 Encounter for antineoplastic radiation therapy: Secondary | ICD-10-CM | POA: Diagnosis not present

## 2016-07-06 DIAGNOSIS — Z170 Estrogen receptor positive status [ER+]: Secondary | ICD-10-CM | POA: Insufficient documentation

## 2016-07-06 DIAGNOSIS — C50411 Malignant neoplasm of upper-outer quadrant of right female breast: Secondary | ICD-10-CM

## 2016-07-06 MED ORDER — AMPHETAMINE-DEXTROAMPHET ER 30 MG PO CP24: 30.0000 mg | ORAL_CAPSULE | Freq: Every day | ORAL | 0 refills | Status: DC

## 2016-07-06 NOTE — Addendum Note (Signed)
Addended by: France Ravens I on: 07/06/2016 09:33 AM   Modules accepted: Orders

## 2016-07-06 NOTE — Telephone Encounter (Signed)
Rx. up front [facility]/fim 

## 2016-07-06 NOTE — Progress Notes (Signed)
  Radiation Oncology         [phone] ________________________________  Name: Raven Montoya MRN: [id number]  Date: 07/06/2016  DOB: 1960/11/12  Weekly Radiation Therapy Management    ICD-9-CM ICD-10-CM   1. Malignant neoplasm of upper-outer quadrant of right breast in female, estrogen receptor positive ([facility]) 174.4 C50.411    V86.0 Z17.0      Current Dose: 39.6 Gy     Planned Dose:  50.4 Gy  Narrative . . . . . . . . The patient presents for routine under treatment assessment.                                   Raven Montoya completed 22nd fraction of radiation to right breast.  Patient denies having any pain. Patient reports fatigue but that is patients baseline.  Patient reports no changes in appetite.  Patient states she uses radiaplex BID.  She completed the Cipro and the redness to her abdomen that started two weeks ago has resolved.                                  Set-up films were reviewed.                                 The chart was checked. Physical Findings. . .  weight is 233 lb 12.8 oz (106.1 kg). Her oral temperature is 97.9 F (36.6 C). Her blood pressure is 143/87 (abnormal) and her pulse is 91. Her respiration is 18 and oxygen saturation is 97%. . Weight essentially stable.  The lungs are clear. The heart has regular rhythm and rate. The right breast area shows diffuse erythema without any moist desquamation Impression . . . . . . . The patient is tolerating radiation. Plan . . . . . . . . . . . . Continue treatment as planned.  ________________________________   Blair Promise, PhD, MD

## 2016-07-06 NOTE — Progress Notes (Signed)
Raven Montoya completed 22nd fraction of radiation to right breast.  Patient denies having any pain. Patient reports fatigue but that is patients baseline.  Patient reports no changes in appetite.  Patient states she uses radiaplex BID.  She completed the Cipro and the redness  To her abdomen that started two weeks ago has resolved.  Skin to right breast is very red and has some peeling areas that are healing.  She reports continued use of neosporin to those areas.   Vitals:   07/06/16 1323  BP: (!) 143/87  Pulse: 91  Resp: 18  Temp: 97.9 F (36.6 C)  TempSrc: Oral  SpO2: 97%  Weight: 233 lb 12.8 oz (106.1 kg)   Wt Readings from Last 3 Encounters:  07/06/16 233 lb 12.8 oz (106.1 kg)  06/30/16 235 lb 3.2 oz (106.7 kg)  05/19/16 231 lb 12.8 oz (105.1 kg)

## 2016-07-06 NOTE — Telephone Encounter (Signed)
Rx. awaiting RAS sig/fim 

## 2016-07-06 NOTE — Telephone Encounter (Signed)
Patient called office requesting refill for amphetamine-dextroamphetamine (ADDERALL XR) 30 MG 24 hr capsule.

## 2016-07-07 ENCOUNTER — Ambulatory Visit
Admission: RE | Admit: 2016-07-07 | Discharge: 2016-07-07 | Disposition: A | Discharge status: Home / Self Care | Payer: BC Managed Care – PPO | Source: Ambulatory Visit | Attending: Radiation Oncology | Admitting: Radiation Oncology

## 2016-07-07 DIAGNOSIS — Z510 Encounter for antineoplastic radiation therapy: Secondary | ICD-10-CM | POA: Diagnosis not present

## 2016-07-08 ENCOUNTER — Ambulatory Visit
Admission: RE | Admit: 2016-07-08 | Discharge: 2016-07-08 | Disposition: A | Discharge status: Home / Self Care | Payer: BC Managed Care – PPO | Source: Ambulatory Visit | Attending: Radiation Oncology | Admitting: Radiation Oncology

## 2016-07-08 DIAGNOSIS — Z510 Encounter for antineoplastic radiation therapy: Secondary | ICD-10-CM | POA: Diagnosis not present

## 2016-07-09 ENCOUNTER — Ambulatory Visit
Admission: RE | Admit: 2016-07-09 | Discharge: 2016-07-09 | Disposition: A | Discharge status: Home / Self Care | Payer: BC Managed Care – PPO | Source: Ambulatory Visit | Attending: Radiation Oncology | Admitting: Radiation Oncology

## 2016-07-09 DIAGNOSIS — Z510 Encounter for antineoplastic radiation therapy: Secondary | ICD-10-CM | POA: Diagnosis not present

## 2016-07-12 ENCOUNTER — Ambulatory Visit: Payer: BC Managed Care – PPO

## 2016-07-12 ENCOUNTER — Ambulatory Visit
Admission: RE | Admit: 2016-07-12 | Discharge: 2016-07-12 | Disposition: A | Discharge status: Home / Self Care | Payer: BC Managed Care – PPO | Source: Ambulatory Visit | Attending: Radiation Oncology | Admitting: Radiation Oncology

## 2016-07-12 DIAGNOSIS — Z510 Encounter for antineoplastic radiation therapy: Secondary | ICD-10-CM | POA: Diagnosis not present

## 2016-07-13 ENCOUNTER — Ambulatory Visit
Admission: RE | Admit: 2016-07-13 | Discharge: 2016-07-13 | Disposition: A | Discharge status: Home / Self Care | Payer: BC Managed Care – PPO | Source: Ambulatory Visit | Attending: Radiation Oncology | Admitting: Radiation Oncology

## 2016-07-13 ENCOUNTER — Ambulatory Visit: Payer: BC Managed Care – PPO | Admitting: Radiation Oncology

## 2016-07-13 DIAGNOSIS — Z510 Encounter for antineoplastic radiation therapy: Secondary | ICD-10-CM | POA: Diagnosis not present

## 2016-07-14 ENCOUNTER — Ambulatory Visit
Admission: RE | Admit: 2016-07-14 | Discharge: 2016-07-14 | Disposition: A | Discharge status: Home / Self Care | Payer: BC Managed Care – PPO | Source: Ambulatory Visit | Attending: Radiation Oncology | Admitting: Radiation Oncology

## 2016-07-14 ENCOUNTER — Encounter: Payer: Self-pay | Admitting: Radiation Oncology

## 2016-07-14 VITALS — BP 147/80 | HR 87 | Temp 97.8°F | Wt 234.6 lb

## 2016-07-14 DIAGNOSIS — Z510 Encounter for antineoplastic radiation therapy: Secondary | ICD-10-CM | POA: Diagnosis not present

## 2016-07-14 DIAGNOSIS — Z170 Estrogen receptor positive status [ER+]: Principal | ICD-10-CM

## 2016-07-14 DIAGNOSIS — C50411 Malignant neoplasm of upper-outer quadrant of right female breast: Secondary | ICD-10-CM

## 2016-07-14 MED ORDER — RADIAPLEXRX EX GEL
Freq: Once | CUTANEOUS | Status: AC
  Administered 2016-07-14: 12:00:00 via TOPICAL

## 2016-07-14 NOTE — Progress Notes (Signed)
  Radiation Oncology         [phone] ________________________________  Name: Raven Montoya MRN: [id number]  Date: 07/14/2016  DOB: 07/06/1960  Weekly Radiation Therapy Management    ICD-10-CM   1. Malignant neoplasm of upper-outer quadrant of right breast in female, estrogen receptor positive (HCC) C50.411 hyaluronate sodium (RADIAPLEXRX) gel   Z17.0      Current Dose: 50.4 Gy     Planned Dose:  50.4 Gy  Narrative . . . . . . . . The patient presents for routine under treatment assessment.                                    Ms. Raven Montoya presents for her last fraction of radiaiton to her Right Breast. She denies pain. She does have some fatigue. Her Right Breast is red and tender. She does have peeling to her Right Axilla. She is using neosporin to the Right Axilla and she is using Radiaplex to her Right Breast.                                 Set-up films were reviewed.                                 The chart was checked. Physical Findings. . .  weight is 234 lb 9.6 oz (106.4 kg). Her temperature is 97.8 F (36.6 C). Her blood pressure is 147/80 (abnormal) and her pulse is 87. Her oxygen saturation is 99%. . Weight essentially stable.  The lungs are clear. The heart has regular rhythm and rate. Brisk erythema to the right breast and small area breakdown in the UOQ.  Impression . . . . . . . The patient tolerated radiation. Plan . . . . . . . . . . . . Completed treatment as planned. She was provided a second tube of Radiaplex and a one month follow up appointment today. ________________________________   Blair Promise, PhD, MD  This document serves as a record of services personally performed by Gery Pray, MD. It was created on his behalf by Valeta Harms, a trained medical scribe. The creation of this record is based on the scribe's personal observations and the provider's statements to them. This document has been checked and approved by the attending provider.

## 2016-07-14 NOTE — Progress Notes (Signed)
Raven Montoya presents for her last fraction of radiaiton to her Right Breast. She denies pain. She does have some fatigue. Her Right Breast is red, tender. She does have peeling to her Right Axilla. She is using neosporin to the Right Axilla. She is using Radiaplex to her Right Breast. She was provided a second tube of Radiaplex and a one month follow up appointment today.   BP (!) 147/80   Pulse 87   Temp 97.8 F (36.6 C)   Wt 234 lb 9.6 oz (106.4 kg)   LMP 12/30/2014   SpO2 99% Comment: room air  BMI 41.56 kg/m    Wt Readings from Last 3 Encounters:  07/14/16 234 lb 9.6 oz (106.4 kg)  07/06/16 233 lb 12.8 oz (106.1 kg)  06/30/16 235 lb 3.2 oz (106.7 kg)

## 2016-07-15 ENCOUNTER — Ambulatory Visit (INDEPENDENT_AMBULATORY_CARE_PROVIDER_SITE_OTHER): Payer: BC Managed Care – PPO | Admitting: Neurology

## 2016-07-15 ENCOUNTER — Encounter: Payer: Self-pay | Admitting: Radiation Oncology

## 2016-07-15 ENCOUNTER — Encounter: Payer: Self-pay | Admitting: *Deleted

## 2016-07-15 ENCOUNTER — Encounter: Payer: Self-pay | Admitting: Neurology

## 2016-07-15 VITALS — BP 147/85 | HR 94 | Resp 16 | Ht 63.0 in | Wt 234.0 lb

## 2016-07-15 DIAGNOSIS — C50919 Malignant neoplasm of unspecified site of unspecified female breast: Secondary | ICD-10-CM

## 2016-07-15 DIAGNOSIS — R5383 Other fatigue: Secondary | ICD-10-CM | POA: Diagnosis not present

## 2016-07-15 DIAGNOSIS — G35 Multiple sclerosis: Secondary | ICD-10-CM

## 2016-07-15 DIAGNOSIS — F418 Other specified anxiety disorders: Secondary | ICD-10-CM | POA: Diagnosis not present

## 2016-07-15 DIAGNOSIS — R269 Unspecified abnormalities of gait and mobility: Secondary | ICD-10-CM | POA: Diagnosis not present

## 2016-07-15 DIAGNOSIS — F988 Other specified behavioral and emotional disorders with onset usually occurring in childhood and adolescence: Secondary | ICD-10-CM

## 2016-07-15 MED ORDER — AMPHETAMINE-DEXTROAMPHET ER 30 MG PO CP24: 30.0000 mg | ORAL_CAPSULE | Freq: Every day | ORAL | 0 refills | Status: DC

## 2016-07-15 MED ORDER — GABAPENTIN 600 MG PO TABS
600.0000 mg | ORAL_TABLET | Freq: Every day | ORAL | 12 refills | Status: DC
Start: 2016-07-15 — End: 2017-04-13

## 2016-07-15 NOTE — Progress Notes (Signed)
  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 Patient had brisk erythema to the right breast and a small area of skin breakdown in the upper outer quadrant. She denied pain or fatigue during treatment.  Plan: The patient has completed radiation treatment. The patient will return to radiation oncology clinic for routine followup in one month. I advised them to call or return sooner if they have any questions or concerns related to their recovery or treatment.  -----------------------------------  Blair Promise, PhD, MD  This document serves as a record of services personally performed by Gery Pray, MD. It was created on his behalf by Bethann Humble, a trained medical scribe. The creation of this record is based on the scribe's personal observations and the provider's statements to them. This document has been checked and approved by the attending provider.

## 2016-07-15 NOTE — Progress Notes (Signed)
[facility]  PATIENT: Raven Montoya DOB: 10/22/1960  REFERRING CLINICIAN: Anastasia Pall HISTORY FROM: Patient REASON FOR VISIT: MS   HISTORICAL  CHIEF COMPLAINT:  Chief Complaint  Patient presents with  . Multiple Sclerosis    Sts. she continues to tolerate Gilenya well.  Has just finished tx. for  breast ca. (surgery and radiation, no chemo)/fim  . ADD    HISTORY OF PRESENT ILLNESS:  Raven Montoya is a 56 year old woman with MS.       Breast cancer:   She was diagnosed with stage 1A breast cancer and had radiation after lumpectomy.   She just finished RadRx .    MS:    She is on Gilenya and she tolerates it well.   She denies any exacerbation or new MS symptoms.     She tolerates it well.   Her last MRI 02/12/2015 was unchanged compared to 11/17/2013 MRI (around time she started Gilenya).      Gait/strength/sensation:    Her gait is doing well. She has no recent falls. She denies any significant weakness or numbness in the arms or legs. She will feel some tingling in the hands at times but this is not painful.    Bladder/bowel:   She denies any significant issues with these.  Vision:   She denies any MS related vision problem.   She wears reading glasses.    Fatigue/Sleep:   She notes more fatigue and sleepiness this year.    She drives a school bus.   PSG showed snoring but no OSA.   Her fatigue and sleepiness are better on  Adderall.   She has sleep maintenance insomnia but no sleep onset issues.    Trazodone and Flexeril at bedtime have not helped.     Mood:   She is noting mildly more depression and anxiety.  Her mom dies in 30-Apr-2022 and she was diagnosed with Breast ca in the past 6 months.  .     Cognition:    She notes mild difficulties with attention and focus which have improvement with Adderall.  MS History:  She was diagnosed with multiple sclerosis at age 53 after presenting with optic neuritis in the left eye. Her neurologist discussed with her  that she probably had MS but no medication was started at that time. Vision got better over the next few weeks. A couple years later, she went to see Dr. Effie Shy. He felt that the MS should be treated and she was started on Rebif. She remain on Rebif for many years   She had not had any major exacerbations while on Rebif but had needles fatigue.    In 2014, she switched to [place]. She tolerates Gilenya well.   REVIEW OF SYSTEMS:  Constitutional: No fevers, chills, sweats, or change in appetite.   She has fatigue and mild insomnia Eyes: No visual changes, double vision, eye pain Ear, nose and throat: No hearing loss, ear pain, nasal congestion, sore throat Cardiovascular: No chest pain, palpitations Respiratory:  No shortness of breath at rest or with exertion.   No wheezes GastrointestinaI: No nausea, vomiting, diarrhea, abdominal pain, fecal incontinence Genitourinary:  No dysuria, urinary retention or frequency.  No nocturia. Musculoskeletal:  Mild neck pain, no major back pain Integumentary: No rash, pruritus, skin lesions Neurological: as above Psychiatric: as above.    Endocrine: No palpitations, diaphoresis, change in appetite, change in weigh or increased thirst Hematologic/Lymphatic:  No anemia, purpura, petechiae. Allergic/Immunologic: No itchy/runny  eyes, nasal congestion, recent allergic reactions, rashes  ALLERGIES: Allergies  Allergen Reactions  . Tape Hives    HOME MEDICATIONS: Outpatient Medications Prior to Visit  Medication Sig Dispense Refill  . CALCIUM CARBONATE PO Take 250 mg by mouth daily.     . cholecalciferol (VITAMIN D) 1000 units tablet Take 1,000 Units by mouth daily.     . ciprofloxacin (CIPRO) 500 MG tablet Take 500 mg by mouth.    . cyclobenzaprine (FLEXERIL) 10 MG tablet Take 1 tablet (10 mg total) by mouth at bedtime as needed. (Patient taking differently: Take 10 mg by mouth at bedtime as needed for muscle spasms. ) 30 tablet 11  . GILENYA 0.5  MG CAPS TAKE 1 CAPSULE BY MOUTH EVERY DAY 90 capsule 2  . hyaluronate sodium (RADIAPLEXRX) GEL Apply 1 application topically once.    . IRON PO Take 1 tablet by mouth 2 (two) times a week. occ     . meloxicam (MOBIC) 7.5 MG tablet TAKE 1 TO 2 TABLETS BY MOUTH EVERY DAY WITH FOOD AS NEEDED FOR PAIN 60 tablet 5  . Multiple Vitamin (MULTIVITAMIN) tablet Take 1 tablet by mouth daily.    . non-metallic deodorant Jethro Poling) MISC Apply 1 application topically daily as needed.    . sertraline (ZOLOFT) 50 MG tablet TAKE 1 TABLET(50 MG) BY MOUTH DAILY 30 tablet 0  . vitamin B-12 (CYANOCOBALAMIN) 1000 MCG tablet Take 1,000 mcg by mouth daily.     Marland Kitchen amphetamine-dextroamphetamine (ADDERALL XR) 30 MG 24 hr capsule Take 1 capsule (30 mg total) by mouth daily. 30 capsule 0  . valACYclovir (VALTREX) 1000 MG tablet TAKE 1/2 TABLET BY MOUTH EVERY DAY (Patient not taking: Reported on 05/19/2016) 30 tablet 12   Facility-Administered Medications Prior to Visit  Medication Dose Route Frequency Provider Last Rate Last Dose  . gadopentetate dimeglumine (MAGNEVIST) injection 20 mL  20 mL Intravenous Once PRN Sater, Nanine Means, MD        PAST MEDICAL HISTORY: Past Medical History:  Diagnosis Date  . Anemia   . Anxiety   . Arthritis   . Cancer [facility])    right breast  . Depression   . Memory loss   . Multiple sclerosis ([place])    age 89  . Neuropathy   . Shingles   . 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 Vision abnormalities   . Wears glasses     PAST SURGICAL HISTORY: Past Surgical History:  Procedure Laterality Date  . ADENOIDECTOMY    . BREAST LUMPECTOMY WITH RADIOACTIVE SEED AND SENTINEL LYMPH NODE BIOPSY Right 03/25/2016   Procedure: RIGHT BREAST SEED GUIDED PARTIAL MASTECTOMY WITH RIGHT SENITINEL LYMPH NODE MAPPING;  Surgeon: Erroll Luna, MD;  Location: [place];  Service: General;  Laterality: Right;  . BREAST RECONSTRUCTION Right 03/25/2016   Procedure: RIGHT BREAST RECONSTRUCTION;   Surgeon: Loel Lofty Dillingham, DO;  Location: [place];  Service: Plastics;  Laterality: Right;  . BREAST REDUCTION SURGERY Right 03/25/2016   Procedure: MAMMARY REDUCTION  (BREAST) RIGHT;  Surgeon: Loel Lofty Dillingham, DO;  Location: [place];  Service: Plastics;  Laterality: Right;  . BUNIONECTOMY    . CESAREAN SECTION    . COLONOSCOPY W/ BIOPSIES AND POLYPECTOMY    . INCISION AND DRAINAGE OF WOUND Right 04/21/2016   Procedure: IRRIGATION AND DEBRIDEMENT RIGHT BREAST WOUND;  Surgeon: Wallace Going, DO;  Location: [place];  Service: Plastics;  Laterality: Right;  . TONSILLECTOMY  FAMILY HISTORY: Family History  Problem Relation Age of Onset  . Osteoporosis Mother   . Heart disease Mother   . Stroke Mother   . COPD Mother   . Cancer Father        colon  . Heart disease Brother   . Heart attack Brother     SOCIAL HISTORY:  Social History   Social History  . Marital status: Divorced    Spouse name: N/A  . Number of children: 1  . Years of education: N/A   Occupational History  . Bus Driver    Social History Main Topics  . Smoking status: Never Smoker  . Smokeless tobacco: Never Used  . Alcohol use 0.0 oz/week     Comment: rare  . Drug use: No  . Sexual activity: Yes    Partners: Male    Birth control/ protection: Condom     Comment: partner has vasectomy   Other Topics Concern  . Not on file   Social History Narrative  . No narrative on file     PHYSICAL EXAM  Vitals:   07/15/16 1058  BP: (!) 147/85  Pulse: 94  Resp: 16  Weight: 234 lb (106.1 kg)  Height: 5\' 3"  (1.6 m)    Body mass index is 41.45 kg/m.   General: The patient is well-developed and well-nourished and in no acute distress  Musculoskeletal:   She has a reduced ROM in the neck with rotation. She has tenderness primarily over the left trapezius muscle. Mild tenderness over the lower cervical paraspinal muscles  Neurologic Exam  Mental status: The patient is alert  and oriented x 3 at the time of the examination. The patient has apparent normal recent and remote memory, with an apparently normal attention span and concentration ability.   Speech is normal.  Cranial nerves: Extraocular movements are full. Facial strength and sensation is normal. Trapezius strength strength is normal. There is no dysarthria. The tongue is midline, and the patient has symmetric elevation of the soft palate. No obvious hearing deficits are noted.  Motor:  Muscle bulk and tone are normal. Strength is  5 / 5 in all 4 extremities.   Sensory: Sensory testing is intact to touch and vibration sensation.  Coordination: Cerebellar testing reveals good finger-nose-finger bilaterally.  Gait and station: Station and gait are normal. Tandem gait is mildly wide. Romberg is negative.   Reflexes: Deep tendon reflexes are symmetric and brisk bilaterally. Marland Kitchen    DIAGNOSTIC DATA (LABS, IMAGING, TESTING) - I reviewed patient records, labs, notes, testing and imaging myself where available.  Lab Results  Component Value Date   WBC 4.3 03/23/2016   HGB 13.7 03/23/2016   HCT 41.3 03/23/2016   MCV 92.8 03/23/2016   PLT 228 03/23/2016      Component Value Date/Time   NA 141 03/23/2016 1503   NA 143 02/18/2016 1247   K 3.6 03/23/2016 1503   K 3.9 02/18/2016 1247   CL 105 03/23/2016 1503   CO2 28 03/23/2016 1503   CO2 27 02/18/2016 1247   GLUCOSE 85 03/23/2016 1503   GLUCOSE 83 02/18/2016 1247   BUN 14 03/23/2016 1503   BUN 18.8 02/18/2016 1247   CREATININE 0.72 03/23/2016 1503   CREATININE 0.8 02/18/2016 1247   CALCIUM 9.1 03/23/2016 1503   CALCIUM 9.5 02/18/2016 1247   PROT 6.3 (L) 03/23/2016 1503   PROT 7.2 02/18/2016 1247   ALBUMIN 4.1 03/23/2016 1503   ALBUMIN 4.2 02/18/2016 1247  AST 33 03/23/2016 1503   AST 31 02/18/2016 1247   ALT 31 03/23/2016 1503   ALT 45 02/18/2016 1247   ALKPHOS 66 03/23/2016 1503   ALKPHOS 109 02/18/2016 1247   BILITOT 0.5 03/23/2016 1503     BILITOT 0.80 02/18/2016 1247   GFRNONAA >60 03/23/2016 1503   GFRAA >60 03/23/2016 1503   Lab Results  Component Value Date   CHOL 161 10/17/2012   HDL 48 10/17/2012   LDLCALC 66 10/17/2012   TRIG 236 (H) 10/17/2012   CHOLHDL 3.4 10/17/2012   Lab Results  Component Value Date   HGBA1C 5.4 10/17/2012   No results found for: VITAMINB12 Lab Results  Component Value Date   TSH 1.71 04/01/2015      ASSESSMENT AND PLAN Multiple sclerosis (HCC)  Attention deficit disorder, unspecified hyperactivity presence  Malignant neoplasm of female breast, unspecified estrogen receptor status, unspecified laterality, unspecified site of breast ([place])  Depression with anxiety  Gait disturbance  Other fatigue   1.  Her MS appears to be stable. She will continue Gilenya. Continue Gilenya.  2.  She is advised to exercise stay active. Possible trial to lose weight. Exercise as tolerated and remain active. 3.  Fatigue, sleepiness and attention all improved from Adderall and she will continue.  4.  We discussed going up on the Zoloft to a higher dose but she would prefer to stay on the 50 mg at this time. She will call us back if she changes her mind. 5.   She will come back to see [place] in about 6 months or call sooner if she has new or worsening neurologic symptoms.   Richard A. Felecia Shelling, MD, PhD 03/19/6379, 77:11 AM Certified in Neurology, Clinical Neurophysiology, Sleep Medicine, Pain Medicine and Neuroimaging  [facility] [address], [place], [place] [address] [phone]

## 2016-07-19 ENCOUNTER — Ambulatory Visit: Payer: BC Managed Care – PPO | Admitting: Hematology and Oncology

## 2016-08-06 ENCOUNTER — Ambulatory Visit (HOSPITAL_BASED_OUTPATIENT_CLINIC_OR_DEPARTMENT_OTHER): Payer: BC Managed Care – PPO | Admitting: Hematology and Oncology

## 2016-08-06 DIAGNOSIS — Z170 Estrogen receptor positive status [ER+]: Secondary | ICD-10-CM

## 2016-08-06 DIAGNOSIS — C50411 Malignant neoplasm of upper-outer quadrant of right female breast: Secondary | ICD-10-CM

## 2016-08-06 DIAGNOSIS — Z79811 Long term (current) use of aromatase inhibitors: Secondary | ICD-10-CM | POA: Diagnosis not present

## 2016-08-06 MED ORDER — LETROZOLE 2.5 MG PO TABS: 2.5000 mg | ORAL_TABLET | Freq: Every day | ORAL | 3 refills | Status: DC

## 2016-08-06 NOTE — Assessment & Plan Note (Signed)
03/25/2016: Right lumpectomy: IDC 0.4 cm with DCIS, margins negative, 0/1 lymph node negative, right mammoplasty: Benign, ER 100%, PR 100%, HER-2 negative ratio 1.77, Ki-67 2%, T1a N0 stage IA  Adjuvant radiation therapy started 06/02/2016 completed 07/14/2016  Current treatment: Adjuvant letrozole 2.5 mg daily starting 08/06/2016  Letrozole counseling:We discussed the risks and benefits of anti-estrogen therapy with aromatase inhibitors. These include but not limited to insomnia, hot flashes, mood changes, vaginal dryness, bone density loss, and weight gain. We strongly believe that the benefits far outweigh the risks. Patient understands these risks and consented to starting treatment. Planned treatment duration is 5 years.  Return to clinic in 3 months to go over survivorship care plan and toxicities of antiestrogen therapy with Mendel Ryder our nurse practitioner.

## 2016-08-06 NOTE — Progress Notes (Signed)
Patient Care Team: [name], MD as PCP - General (Internal Medicine) Erroll Luna, MD as Consulting Physician (General Surgery) [name], MD as Consulting Physician (Hematology and Oncology) Gery Pray, MD as Consulting Physician (Radiation Oncology)  DIAGNOSIS:  Encounter Diagnosis  Name Primary?  . Malignant neoplasm of upper-outer quadrant of right breast in female, estrogen receptor positive ([place])     SUMMARY OF ONCOLOGIC HISTORY:   Malignant neoplasm of upper-outer quadrant of right breast in female, estrogen receptor positive ([place])   02/17/2016 Initial Diagnosis    Screening detected right breast architectural distortion, lean year density by ultrasound 1.7 cm at the 11:30 position axilla negative. Ultrasound biopsy: Low-grade DCIS with? Microscopic grade 1 invasive ductal carcinoma ER/PR positive HER-2 could not be done Ki-67 2%; T1a N0 stage IA      03/25/2016 Surgery    Right lumpectomy: IDC 0.4 cm with DCIS, margins negative, 0/1 lymph node negative, right mammoplasty: Benign, ER 100%, PR 100%, HER-2 negative ratio 1.77, Ki-67 2%, T1a N0 stage IA      06/02/2016 - 07/14/2016 Radiation Therapy    Adjuvant radiation therapy      08/06/2016 -  Anti-estrogen oral therapy    Letrozole 2.5 mg daily       CHIEF COMPLIANT: Follow-up after radiation therapy  INTERVAL HISTORY: Raven Montoya is a 56 year old with above-mentioned history of some right breast cancer who had lumpectomy and radiation and is here today to discuss starting antiestrogen therapy. She tolerated radiation fairly well. She did have mild radiation dermatitis as well as fatigue. She also has some muscle aches and pains from multiple sclerosis.  REVIEW OF SYSTEMS:   Constitutional: Denies fevers, chills or abnormal weight loss Eyes: Denies blurriness of vision Ears, nose, mouth, throat, and face: Denies mucositis or sore throat Respiratory: Denies cough, dyspnea or wheezes Cardiovascular: Denies  palpitation, chest discomfort Gastrointestinal:  Denies nausea, heartburn or change in bowel habits Skin: Denies abnormal skin rashes Lymphatics: Denies new lymphadenopathy or easy bruising Neurological:Denies numbness, tingling or new weaknesses Behavioral/Psych: Mood is stable, no new changes  Extremities: No lower extremity edema Breast:  Radiation skin changes All other systems were reviewed with the patient and are negative.  I have reviewed the past medical history, past surgical history, social history and family history with the patient and they are unchanged from previous note.  ALLERGIES:  is allergic to tape.  MEDICATIONS:  Current Outpatient Prescriptions  Medication Sig Dispense Refill  . amphetamine-dextroamphetamine (ADDERALL XR) 30 MG 24 hr capsule Take 1 capsule (30 mg total) by mouth daily. 30 capsule 0  . CALCIUM CARBONATE PO Take 250 mg by mouth daily.     . cholecalciferol (VITAMIN D) 1000 units tablet Take 1,000 Units by mouth daily.     . ciprofloxacin (CIPRO) 500 MG tablet Take 500 mg by mouth.    . cyclobenzaprine (FLEXERIL) 10 MG tablet Take 1 tablet (10 mg total) by mouth at bedtime as needed. (Patient taking differently: Take 10 mg by mouth at bedtime as needed for muscle spasms. ) 30 tablet 11  . gabapentin (NEURONTIN) 600 MG tablet Take 1 tablet (600 mg total) by mouth at bedtime. 30 tablet 12  . GILENYA 0.5 MG CAPS TAKE 1 CAPSULE BY MOUTH EVERY DAY 90 capsule 2  . hyaluronate sodium (RADIAPLEXRX) GEL Apply 1 application topically once.    . IRON PO Take 1 tablet by mouth 2 (two) times a week. occ     . meloxicam (MOBIC) 7.5  MG tablet TAKE 1 TO 2 TABLETS BY MOUTH EVERY DAY WITH FOOD AS NEEDED FOR PAIN 60 tablet 5  . Multiple Vitamin (MULTIVITAMIN) tablet Take 1 tablet by mouth daily.    . non-metallic deodorant Jethro Poling) MISC Apply 1 application topically daily as needed.    . sertraline (ZOLOFT) 50 MG tablet TAKE 1 TABLET(50 MG) BY MOUTH DAILY 30 tablet 0  .  valACYclovir (VALTREX) 1000 MG tablet TAKE 1/2 TABLET BY MOUTH EVERY DAY (Patient not taking: Reported on 05/19/2016) 30 tablet 12  . vitamin B-12 (CYANOCOBALAMIN) 1000 MCG tablet Take 1,000 mcg by mouth daily.      No current facility-administered medications for this visit.    Facility-Administered Medications Ordered in Other Visits  Medication Dose Route Frequency Provider Last Rate Last Dose  . gadopentetate dimeglumine (MAGNEVIST) injection 20 mL  20 mL Intravenous Once PRN Sater, Nanine Means, MD        PHYSICAL EXAMINATION: ECOG PERFORMANCE STATUS: 1 - Symptomatic but completely ambulatory  Vitals:   08/06/16 1158  BP: (!) 147/72  Pulse: 87  Resp: 18  Temp: 97.4 F (36.3 C)   Filed Weights   08/06/16 1158  Weight: 236 lb 8 oz (107.3 kg)    GENERAL:alert, no distress and comfortable SKIN: skin color, texture, turgor are normal, no rashes or significant lesions EYES: normal, Conjunctiva are pink and non-injected, sclera clear OROPHARYNX:no exudate, no erythema and lips, buccal mucosa, and tongue normal  NECK: supple, thyroid normal size, non-tender, without nodularity LYMPH:  no palpable lymphadenopathy in the cervical, axillary or inguinal LUNGS: clear to auscultation and percussion with normal breathing effort HEART: regular rate & rhythm and no murmurs and no lower extremity edema ABDOMEN:abdomen soft, non-tender and normal bowel sounds MUSCULOSKELETAL:no cyanosis of digits and no clubbing  NEURO: alert & oriented x 3 with fluent speech, no focal motor/sensory deficits EXTREMITIES: No lower extremity edema  LABORATORY DATA:  I have reviewed the data as listed   Chemistry      Component Value Date/Time   NA 141 03/23/2016 1503   NA 143 02/18/2016 1247   K 3.6 03/23/2016 1503   K 3.9 02/18/2016 1247   CL 105 03/23/2016 1503   CO2 28 03/23/2016 1503   CO2 27 02/18/2016 1247   BUN 14 03/23/2016 1503   BUN 18.8 02/18/2016 1247   CREATININE 0.72 03/23/2016 1503    CREATININE 0.8 02/18/2016 1247      Component Value Date/Time   CALCIUM 9.1 03/23/2016 1503   CALCIUM 9.5 02/18/2016 1247   ALKPHOS 66 03/23/2016 1503   ALKPHOS 109 02/18/2016 1247   AST 33 03/23/2016 1503   AST 31 02/18/2016 1247   ALT 31 03/23/2016 1503   ALT 45 02/18/2016 1247   BILITOT 0.5 03/23/2016 1503   BILITOT 0.80 02/18/2016 1247       Lab Results  Component Value Date   WBC 4.3 03/23/2016   HGB 13.7 03/23/2016   HCT 41.3 03/23/2016   MCV 92.8 03/23/2016   PLT 228 03/23/2016   NEUTROABS 3.3 02/18/2016    ASSESSMENT & PLAN:  Malignant neoplasm of upper-outer quadrant of right breast in female, estrogen receptor positive ([place]) 03/25/2016: Right lumpectomy: IDC 0.4 cm with DCIS, margins negative, 0/1 lymph node negative, right mammoplasty: Benign, ER 100%, PR 100%, HER-2 negative ratio 1.77, Ki-67 2%, T1a N0 stage IA  Adjuvant radiation therapy started 06/02/2016 completed 07/14/2016  Current treatment: Adjuvant letrozole 2.5 mg daily starting 08/06/2016  Letrozole counseling:We discussed the risks  and benefits of anti-estrogen therapy with aromatase inhibitors. These include but not limited to insomnia, hot flashes, mood changes, vaginal dryness, bone density loss, and weight gain. We strongly believe that the benefits far outweigh the risks. Patient understands these risks and consented to starting treatment. Planned treatment duration is 5 years.  Return to clinic in 3 months to go over survivorship care plan and toxicities of antiestrogen therapy with Mendel Ryder our nurse practitioner.   I spent 25 minutes talking to the patient of which more than half was spent in counseling and coordination of care.  No orders of the defined types were placed in this encounter.  The patient has a good understanding of the overall plan. she agrees with it. she will call with any problems that may develop before the next visit here.   Rulon Eisenmenger, MD 08/06/16

## 2016-08-13 NOTE — Progress Notes (Signed)
Radiation Oncology         [phone]) [phone] ________________________________  Name: Raven Montoya MRN: [id number]  Date: 08/16/2016  DOB: 24-Oct-1960  Follow-Up Visit Note  CC: [name], MD  [name], MD    ICD-10-CM   1. 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 Patient started on Femara about a week ago,  No siide effects have been noted.Some residual fatigue since her treatment was complete.                    ALLERGIES:  is allergic to tape.  Meds: Current Outpatient Prescriptions  Medication Sig Dispense Refill  . amphetamine-dextroamphetamine (ADDERALL XR) 30 MG 24 hr capsule Take 1 capsule (30 mg total) by mouth daily. 30 capsule 0  . CALCIUM CARBONATE PO Take 250 mg by mouth daily.     . cholecalciferol (VITAMIN D) 1000 units tablet Take 1,000 Units by mouth daily.     . cyclobenzaprine (FLEXERIL) 10 MG tablet Take 1 tablet (10 mg total) by mouth at bedtime as needed. (Patient taking differently: Take 10 mg by mouth at bedtime as needed for muscle spasms. ) 30 tablet 11  . gabapentin (NEURONTIN) 600 MG tablet Take 1 tablet (600 mg total) by mouth at bedtime. 30 tablet 12  . GILENYA 0.5 MG CAPS TAKE 1 CAPSULE BY MOUTH EVERY DAY 90 capsule 2  . letrozole (FEMARA) 2.5 MG tablet Take 1 tablet (2.5 mg total) by mouth daily. 90 tablet 3  . meloxicam (MOBIC) 7.5 MG tablet TAKE 1 TO 2 TABLETS BY MOUTH EVERY DAY WITH FOOD AS NEEDED FOR PAIN 60 tablet 5  . Multiple Vitamin (MULTIVITAMIN) tablet Take 1 tablet by mouth daily.    . sertraline (ZOLOFT) 50 MG tablet TAKE 1 TABLET(50 MG) BY MOUTH DAILY 30 tablet 0  . vitamin B-12 (CYANOCOBALAMIN) 1000 MCG tablet Take 1,000 mcg by mouth  daily.     . IRON PO Take 1 tablet by mouth 2 (two) times a week. occ     . valACYclovir (VALTREX) 1000 MG tablet TAKE 1/2 TABLET BY MOUTH EVERY DAY (Patient not taking: Reported on 05/19/2016) 30 tablet 12   No current facility-administered medications for this encounter.    Facility-Administered Medications Ordered in Other Encounters  Medication Dose Route Frequency Provider Last Rate Last Dose  . gadopentetate dimeglumine (MAGNEVIST) injection 20 mL  20 mL Intravenous Once PRN Sater, Nanine Means, MD        Physical Findings: The patient is in no acute distress. Patient is alert and oriented.  height is 5' 3" (1.6 m) and weight is 233 lb 9.6 oz (106 kg). Her oral temperature is 97.9 F (36.6 C). Her blood pressure is 136/84 and her pulse is 88. Her oxygen saturation is 98%. .    Lungs are clear to auscultation bilaterally. Heart has regular rate and rhythm. No palpable cervical, supraclavicular, or axillary adenopathy. Abdomen soft, non-tender, normal bowel sounds.  Right breast, has hyper pigmentation and mild erythema. Patient skin has healed well, no palpable mass.  Lab Findings: Lab Results  Component Value Date   WBC 4.3 03/23/2016   HGB 13.7 03/23/2016   HCT 41.3  03/23/2016   MCV 92.8 03/23/2016   PLT 228 03/23/2016    Radiographic Findings: No results found.  Impression:  The patient is recovering from the effects of radiation.  No evidence of Recurrence on clinical exam.  Plan:  PRN follow up with radiation oncology. Paitent will continue to follow up with  Surgery and Medical Oncology. She is planning on having  reconstruction surgery on her left side.  ____________________________________ -----------------------------------  Blair Promise, PhD, MD  This document serves as a record of services personally performed by Gery Pray MD. It was created on his behalf by Delton Coombes, a trained medical scribe. The creation of this record is based on the scribe's  personal observations and the provider's statements to them. This document has been checked and approved by the attending provider.

## 2016-08-16 ENCOUNTER — Ambulatory Visit
Admission: RE | Admit: 2016-08-16 | Discharge: 2016-08-16 | Disposition: A | Discharge status: Home / Self Care | Payer: BC Managed Care – PPO | Source: Ambulatory Visit | Attending: Radiation Oncology | Admitting: Radiation Oncology

## 2016-08-16 ENCOUNTER — Encounter: Payer: Self-pay | Admitting: Oncology

## 2016-08-16 DIAGNOSIS — C50411 Malignant neoplasm of upper-outer quadrant of right female breast: Secondary | ICD-10-CM | POA: Insufficient documentation

## 2016-08-16 DIAGNOSIS — Z170 Estrogen receptor positive status [ER+]: Secondary | ICD-10-CM | POA: Insufficient documentation

## 2016-08-16 HISTORY — DX: Personal history of irradiation: Z92.3

## 2016-08-16 NOTE — Progress Notes (Signed)
Raven Montoya is here for follow up after treatment to her right breast.  She denies having pain.  She is taking Femera.  She reports her energy level is improving.  The skin on her right breast is pink.  BP 136/84 (BP Location: Left Arm, Patient Position: Sitting)   Pulse 88   Temp 97.9 F (36.6 C) (Oral)   Ht 5\' 3"  (1.6 m)   Wt 233 lb 9.6 oz (106 kg)   LMP 12/30/2014   SpO2 98%   BMI 41.38 kg/m    Wt Readings from Last 3 Encounters:  08/16/16 233 lb 9.6 oz (106 kg)  08/06/16 236 lb 8 oz (107.3 kg)  07/15/16 234 lb (106.1 kg)

## 2016-08-20 NOTE — Addendum Note (Signed)
Addendum  created 08/20/16 1441 by Lyndle Herrlich, MD   Sign clinical note

## 2016-08-20 NOTE — Anesthesia Postprocedure Evaluation (Signed)
Anesthesia Post Note  Patient: Raven Montoya  Procedure(s) Performed: Procedure(s) (LRB): IRRIGATION AND DEBRIDEMENT RIGHT BREAST WOUND (Right)     Anesthesia Post Evaluation  Last Vitals:  Vitals:   04/21/16 1352 04/21/16 1407  BP: 130/75 129/67  Pulse: 90 87  Resp: 15 16  Temp: 36.9 C 36.6 C    Last Pain:  Vitals:   04/22/16 1428  TempSrc:   PainSc: 3                  Arlicia Paquette EDWARD

## 2016-09-09 ENCOUNTER — Telehealth: Payer: Self-pay | Admitting: Neurology

## 2016-09-09 MED ORDER — AMPHETAMINE-DEXTROAMPHET ER 30 MG PO CP24: 30.0000 mg | ORAL_CAPSULE | Freq: Every day | ORAL | 0 refills | Status: DC

## 2016-09-09 NOTE — Telephone Encounter (Signed)
Pt calling for refill of amphetamine-dextroamphetamine (ADDERALL XR) 30 MG 24 hr capsule °

## 2016-09-09 NOTE — Telephone Encounter (Signed)
Rx. up front [facility]/fim 

## 2016-09-09 NOTE — Addendum Note (Signed)
Addended by: France Ravens I on: 09/09/2016 12:03 PM   Modules accepted: Orders

## 2016-09-09 NOTE — Telephone Encounter (Signed)
Rx. awaiting RAS sig/fim 

## 2016-09-20 ENCOUNTER — Encounter: Payer: Self-pay | Admitting: Neurology

## 2016-09-29 ENCOUNTER — Telehealth: Payer: Self-pay | Admitting: Neurology

## 2016-09-29 NOTE — Telephone Encounter (Signed)
I spoke to DeeDee about the MRI of the cervical spine. Although not on the radiologist report, I do see a focus within the spinal cord to the left adjacent to C4 on both the sagittal and axial images. It is consistent with MS   Additionally she has a cyst to the left at C1-C2 that could be a synovial cyst. There is thickening and probable calcification of the posterior longitudinal ligament from C2-C3 to C4-C5. There are multilevel degenerative changes without definite nerve root compression throughout the cervical spine and there is minimal anterolisthesis at C7-T1.

## 2016-10-05 ENCOUNTER — Encounter: Payer: BC Managed Care – PPO | Admitting: Adult Health

## 2016-10-30 ENCOUNTER — Other Ambulatory Visit: Payer: Self-pay | Admitting: Neurology

## 2016-11-02 ENCOUNTER — Telehealth: Payer: Self-pay | Admitting: Neurology

## 2016-11-02 MED ORDER — AMPHETAMINE-DEXTROAMPHET ER 30 MG PO CP24: 30.0000 mg | ORAL_CAPSULE | Freq: Every day | ORAL | 0 refills | Status: DC

## 2016-11-02 NOTE — Addendum Note (Signed)
Addended by: France Ravens I on: 11/02/2016 11:35 AM   Modules accepted: Orders

## 2016-11-02 NOTE — Telephone Encounter (Signed)
Rx. up front [facility]/fim 

## 2016-11-02 NOTE — Telephone Encounter (Signed)
Patient requesting refill of amphetamine-dextroamphetamine (ADDERALL XR) 30 MG 24 hr capsule.

## 2016-11-02 NOTE — Telephone Encounter (Signed)
Rx. awaiting RAS sig/fim 

## 2016-11-04 ENCOUNTER — Telehealth: Payer: Self-pay | Admitting: *Deleted

## 2016-11-04 MED ORDER — MELOXICAM 7.5 MG PO TABS: ORAL_TABLET | ORAL | 5 refills | Status: DC

## 2016-11-04 MED ORDER — CYCLOBENZAPRINE HCL 10 MG PO TABS: 10.0000 mg | ORAL_TABLET | Freq: Every evening | ORAL | 11 refills | Status: DC | PRN

## 2016-11-04 NOTE — Telephone Encounter (Signed)
Mobic and Flexeril escribed to [facility] as requested/fim

## 2016-12-02 ENCOUNTER — Other Ambulatory Visit: Payer: Self-pay | Admitting: Neurology

## 2016-12-07 ENCOUNTER — Encounter: Payer: Self-pay | Admitting: Adult Health

## 2016-12-07 ENCOUNTER — Ambulatory Visit (HOSPITAL_BASED_OUTPATIENT_CLINIC_OR_DEPARTMENT_OTHER): Payer: BC Managed Care – PPO | Admitting: Adult Health

## 2016-12-07 VITALS — BP 148/82 | HR 97 | Temp 98.2°F | Resp 18 | Ht 63.0 in | Wt 243.4 lb

## 2016-12-07 DIAGNOSIS — Z170 Estrogen receptor positive status [ER+]: Secondary | ICD-10-CM | POA: Diagnosis not present

## 2016-12-07 DIAGNOSIS — C50411 Malignant neoplasm of upper-outer quadrant of right female breast: Secondary | ICD-10-CM | POA: Diagnosis not present

## 2016-12-07 DIAGNOSIS — Z79811 Long term (current) use of aromatase inhibitors: Secondary | ICD-10-CM | POA: Diagnosis not present

## 2016-12-07 DIAGNOSIS — E2839 Other primary ovarian failure: Secondary | ICD-10-CM

## 2016-12-07 DIAGNOSIS — N951 Menopausal and female climacteric states: Secondary | ICD-10-CM

## 2016-12-07 NOTE — Progress Notes (Signed)
CLINIC:  Survivorship   REASON FOR VISIT:  Routine follow-up post-treatment for a recent history of breast cancer.  BRIEF ONCOLOGIC HISTORY:    Malignant neoplasm of upper-outer quadrant of right breast in female, estrogen receptor positive ([place])   02/17/2016 Initial Diagnosis    Screening detected right breast architectural distortion, lean year density by ultrasound 1.7 cm at the 11:30 position axilla negative. Ultrasound biopsy: Low-grade DCIS with? Microscopic grade 1 invasive ductal carcinoma ER/PR positive HER-2 could not be done Ki-67 2%; T1a N0 stage IA      03/25/2016 Surgery    Right lumpectomy: IDC 0.4 cm with DCIS, margins negative, 0/1 lymph node negative, right mammoplasty: Benign, ER 100%, PR 100%, HER-2 negative ratio 1.77, Ki-67 2%, T1a N0 stage IA      06/02/2016 - 07/14/2016 Radiation Therapy    Adjuvant radiation therapy      08/06/2016 -  Anti-estrogen oral therapy    Letrozole 2.5 mg daily       INTERVAL HISTORY:  Raven Montoya presents to the [facility] today for our initial meeting to review her survivorship care plan detailing her treatment course for breast cancer, as well as monitoring long-term side effects of that treatment, education regarding health maintenance, screening, and overall wellness and health promotion.     Overall, Raven Montoya reports feeling moderately well.  She is taking Letrozole daily and is managing the side effects.  She does have some hot flashes, moodiness, vaginal dryness and achiness.  She is managing these, and adjusting to these.  She did also lose her month in February of this year, which has been difficult on her.      REVIEW OF SYSTEMS:  Review of Systems  Constitutional: Negative for appetite change, chills, diaphoresis, fever and unexpected weight change.  HENT:   Negative for hearing loss and lump/mass.   Eyes: Negative for eye problems and icterus.  Respiratory: Negative for chest tightness, cough and shortness of  breath.   Cardiovascular: Negative for chest pain, leg swelling and palpitations.  Gastrointestinal: Negative for abdominal distention, abdominal pain, constipation, diarrhea and nausea.  Endocrine: Positive for hot flashes (see interval history).  Musculoskeletal: Positive for arthralgias (see interval history).  Skin: Negative for itching and rash.  Neurological: Negative for dizziness, extremity weakness and numbness.  Hematological: Negative for adenopathy. Does not bruise/bleed easily.  Psychiatric/Behavioral: Negative for depression. The patient is not nervous/anxious.   Breast: Denies any new nodularity, masses, tenderness, nipple changes, or nipple discharge.      ONCOLOGY TREATMENT TEAM:  1. Surgeon:  Dr. Brantley Stage at [facility] 2. Medical Oncologist: Dr. Lindi Adie  3. Radiation Oncologist: Dr. Sondra Come    PAST MEDICAL/SURGICAL HISTORY:  Past Medical History:  Diagnosis Date  . Anemia   . Anxiety   . Arthritis   . Cancer [facility])    right breast  . Depression   . History of radiation therapy 06/02/16-07/14/16   right breast 50.4 Gy in 28 fractions  . Memory loss   . Multiple sclerosis ([place])    age 51  . Neuropathy   . Shingles   . 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 Vision abnormalities   . Wears glasses    Past Surgical History:  Procedure Laterality Date  . ADENOIDECTOMY    . BREAST LUMPECTOMY WITH RADIOACTIVE SEED AND SENTINEL LYMPH NODE BIOPSY Right 03/25/2016   Procedure: RIGHT BREAST SEED GUIDED PARTIAL MASTECTOMY WITH RIGHT SENITINEL LYMPH NODE MAPPING;  Surgeon: Erroll Luna, MD;  Location: [place];  Service: General;  Laterality: Right;  . BREAST RECONSTRUCTION Right 03/25/2016   Procedure: RIGHT BREAST RECONSTRUCTION;  Surgeon: Loel Lofty Dillingham, DO;  Location: [place];  Service: Plastics;  Laterality: Right;  . BREAST REDUCTION SURGERY Right 03/25/2016   Procedure: MAMMARY REDUCTION  (BREAST) RIGHT;  Surgeon: Loel Lofty Dillingham, DO;   Location: [place];  Service: Plastics;  Laterality: Right;  . BUNIONECTOMY    . CESAREAN SECTION    . COLONOSCOPY W/ BIOPSIES AND POLYPECTOMY    . INCISION AND DRAINAGE OF WOUND Right 04/21/2016   Procedure: IRRIGATION AND DEBRIDEMENT RIGHT BREAST WOUND;  Surgeon: Wallace Going, DO;  Location: [place];  Service: Plastics;  Laterality: Right;  . TONSILLECTOMY       ALLERGIES:  Allergies  Allergen Reactions  . Tape Hives     CURRENT MEDICATIONS:  Outpatient Encounter Prescriptions as of 12/07/2016  Medication Sig Note  . amphetamine-dextroamphetamine (ADDERALL XR) 30 MG 24 hr capsule Take 1 capsule (30 mg total) by mouth daily.   Marland Kitchen CALCIUM CARBONATE PO Take 250 mg by mouth daily.    . cholecalciferol (VITAMIN D) 1000 units tablet Take 1,000 Units by mouth daily.    . cyclobenzaprine (FLEXERIL) 10 MG tablet Take 1 tablet (10 mg total) by mouth at bedtime as needed.   . gabapentin (NEURONTIN) 600 MG tablet Take 1 tablet (600 mg total) by mouth at bedtime.   Marland Kitchen GILENYA 0.5 MG CAPS TAKE 1 CAPSULE BY MOUTH EVERY DAY 06/17/2016: Fax received from [facility].  Gilenya approved for dates 06/16/16 thru 06/17/18.  PA# Frederick [phone] TY./fim   . IRON PO Take 1 tablet by mouth 2 (two) times a week. occ    . letrozole (FEMARA) 2.5 MG tablet Take 1 tablet (2.5 mg total) by mouth daily.   . meloxicam (MOBIC) 7.5 MG tablet TAKE 1 TO 2 TABLETS BY MOUTH EVERY DAY WITH FOOD AS NEEDED FOR PAIN   . Multiple Vitamin (MULTIVITAMIN) tablet Take 1 tablet by mouth daily.   . sertraline (ZOLOFT) 50 MG tablet TAKE 1 TABLET BY MOUTH ONCE DAILY   . valACYclovir (VALTREX) 1000 MG tablet TAKE 1/2 TABLET BY MOUTH EVERY DAY (Patient not taking: Reported on 05/19/2016)   . vitamin B-12 (CYANOCOBALAMIN) 1000 MCG tablet Take 1,000 mcg by mouth daily.     Facility-Administered Encounter Medications as of 12/07/2016  Medication  . gadopentetate dimeglumine (MAGNEVIST)  injection 20 mL     ONCOLOGIC FAMILY HISTORY:  Family History  Problem Relation Age of Onset  . Osteoporosis Mother   . Heart disease Mother   . Stroke Mother   . COPD Mother   . Cancer Father        colon  . Heart disease Brother   . Heart attack Brother       SOCIAL HISTORY:  Raven Montoya is single and lives with her son in [place], [place].  She has 1 son that lives with her..  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  She denies any current or history of tobacco, alcohol, or illicit drug use.     PHYSICAL EXAMINATION:  Vital Signs:   Vitals:   12/07/16 1310  BP: (!) 148/82  Pulse: 97  Resp: 18  Temp: 98.2 F (36.8 C)  SpO2: 98%   Filed Weights   12/07/16 1310  Weight: 243 lb 6.4 oz (110.4  kg)   General: Well-nourished, well-appearing female in no acute distress.  She is unaccompanied today.   HEENT: Head is normocephalic.  Pupils equal and reactive to light. Conjunctivae clear without exudate.  Sclerae anicteric. Oral mucosa is pink, moist.  Oropharynx is pink without lesions or erythema.  Lymph: No cervical, supraclavicular, or infraclavicular lymphadenopathy noted on palpation.  Cardiovascular: Regular rate and rhythm.Marland Kitchen Respiratory: Clear to auscultation bilaterally. Chest expansion symmetric; breathing non-labored.  GI: Abdomen soft and round; non-tender, non-distended. Bowel sounds normoactive.  GU: Deferred.  Neuro: No focal deficits. Steady gait.  Psych: Mood and affect normal and appropriate for situation.  Extremities: No edema. MSK: No focal spinal tenderness to palpation.  Full range of motion in bilateral upper extremities Skin: Warm and dry.  LABORATORY DATA:  None for this visit.  DIAGNOSTIC IMAGING:  None for this visit.      ASSESSMENT AND PLAN:  Ms.. Montoya is a pleasant 56 y.o. female with Stage IA right breast invasive ductal carcinoma, ER+/PR+/HER2-, diagnosed in 02/2016, treated with lumpectomy,  adjuvant radiation therapy, and anti-estrogen therapy with Letrozole beginning in 07/2016.  She presents to the Survivorship Clinic for our initial meeting and routine follow-up post-completion of treatment for breast cancer.    1. Stage IA right breast cancer:  Raven Montoya is continuing to recover from definitive treatment for breast cancer. She will follow-up with her medical oncologist, Dr. Lindi Adie in 04/2017 with history and physical exam per surveillance protocol.  She will continue her anti-estrogen therapy with Letrozole. Thus far, she is tolerating the Letrozole well, with minimal side effects. She was instructed to make Dr. Lindi Adie or myself aware if she begins to experience any worsening side effects of the medication and I could see her back in clinic to help manage those side effects, as needed.  Today, a comprehensive survivorship care plan and treatment summary was reviewed with the patient today detailing her breast cancer diagnosis, treatment course, potential late/long-term effects of treatment, appropriate follow-up care with recommendations for the future, and patient education resources.  A copy of this summary, along with a letter will be sent to the patient's primary care provider via mail/fax/In Basket message after today's visit.    2. Bone health:  Given Raven Montoya's age/history of breast cancer and her current treatment regimen including anti-estrogen therapy with Letrozole, she is at risk for bone demineralization.  She has not yet had dexa, therefore I ordered one to be done when she has her mammogram in 01/2017.  In the meantime, she was encouraged to increase her consumption of foods rich in calcium, as well as increase her weight-bearing activities.  She was given education on specific activities to promote bone health.  3. Cancer screening:  Due to Raven Montoya's history and her age, she should receive screening for skin cancers, colon cancer, and gynecologic cancers.  The information  and recommendations are listed on the patient's comprehensive care plan/treatment summary and were reviewed in detail with the patient.    4. Health maintenance and wellness promotion: Raven Montoya was encouraged to consume 5-7 servings of fruits and vegetables per day. We reviewed the "Nutrition Rainbow" handout, as well as the handout "Take Control of Your Health and Reduce Your Cancer Risk" from the [place].  She was also encouraged to engage in moderate to vigorous exercise for 30 minutes per day most days of the week. We discussed the [employer] fitness program, which is designed for cancer survivors to help them become  more physically fit after cancer treatments.  She was instructed to limit her alcohol consumption and continue to abstain from tobacco use.     5. Support services/counseling: It is not uncommon for this period of the patient's cancer care trajectory to be one of many emotions and stressors.  We discussed an opportunity for her to participate in the next session of [facility] ("Finding Your New Normal") support group series designed for patients after they have completed treatment.   Raven Montoya was encouraged to take advantage of our many other support services programs, support groups, and/or counseling in coping with her new life as a cancer survivor after completing anti-cancer treatment.  She was offered support today through active listening and expressive supportive counseling.  She was given information regarding our available services and encouraged to contact me with any questions or for help enrolling in any of our support group/programs.    Dispo:   -Return to cancer center in 04/2017 for follow up with Dr. Lindi Adie (annually, every march) -Mammogram due in 01/2017 -Bone Density Due -Follow up with Dr. Brantley Stage, next available, then annually in September -She is welcome to return back to the Survivorship Clinic at any time; no additional follow-up needed at this  time.  -Consider referral back to survivorship as a long-term survivor for continued surveillance  A total of (30) minutes of face-to-face time was spent with this patient with greater than 50% of that time in counseling and care-coordination.   Gardenia Phlegm, NP Survivorship Program [place] [phone]   Note: PRIMARY CARE PROVIDER [name], [place] [phone]

## 2016-12-08 ENCOUNTER — Telehealth: Payer: Self-pay | Admitting: Neurology

## 2016-12-08 MED ORDER — AMPHETAMINE-DEXTROAMPHET ER 30 MG PO CP24
30.0000 mg | ORAL_CAPSULE | Freq: Every day | ORAL | 0 refills | Status: DC
Start: 2016-12-08 — End: 2017-01-19

## 2016-12-08 NOTE — Telephone Encounter (Signed)
Patient requesting refill of amphetamine-dextroamphetamine (ADDERALL XR) 30 MG 24 hr capsule.

## 2016-12-08 NOTE — Addendum Note (Signed)
Addended by: France Ravens I on: 12/08/2016 10:48 AM   Modules accepted: Orders

## 2016-12-08 NOTE — Telephone Encounter (Signed)
Rx. awaiting RAS sig/fim 

## 2016-12-08 NOTE — Telephone Encounter (Signed)
Rx. up front [facility]/fim 

## 2017-01-03 ENCOUNTER — Encounter (HOSPITAL_BASED_OUTPATIENT_CLINIC_OR_DEPARTMENT_OTHER): Payer: Self-pay | Admitting: *Deleted

## 2017-01-03 ENCOUNTER — Other Ambulatory Visit: Payer: Self-pay

## 2017-01-03 ENCOUNTER — Telehealth: Payer: Self-pay | Admitting: *Deleted

## 2017-01-03 MED ORDER — SERTRALINE HCL 50 MG PO TABS: 50.0000 mg | ORAL_TABLET | Freq: Every day | ORAL | 1 refills | Status: DC

## 2017-01-03 MED ORDER — MELOXICAM 7.5 MG PO TABS: ORAL_TABLET | ORAL | 1 refills | Status: DC

## 2017-01-03 NOTE — Telephone Encounter (Signed)
Rx's for Sertraline and Meloxicam changed from 30 day supplies to 90 day supplies [facility]

## 2017-01-04 ENCOUNTER — Ambulatory Visit: Payer: Self-pay | Admitting: Plastic Surgery

## 2017-01-04 DIAGNOSIS — N651 Disproportion of reconstructed breast: Secondary | ICD-10-CM

## 2017-01-06 ENCOUNTER — Other Ambulatory Visit: Payer: Self-pay

## 2017-01-06 ENCOUNTER — Encounter (HOSPITAL_BASED_OUTPATIENT_CLINIC_OR_DEPARTMENT_OTHER): Payer: Self-pay | Admitting: *Deleted

## 2017-01-06 ENCOUNTER — Ambulatory Visit (HOSPITAL_BASED_OUTPATIENT_CLINIC_OR_DEPARTMENT_OTHER): Payer: BC Managed Care – PPO | Admitting: Anesthesiology

## 2017-01-06 ENCOUNTER — Encounter (HOSPITAL_BASED_OUTPATIENT_CLINIC_OR_DEPARTMENT_OTHER)
Admission: RE | Disposition: A | Discharge status: Home / Self Care | Payer: Self-pay | Source: Ambulatory Visit | Attending: Plastic Surgery

## 2017-01-06 ENCOUNTER — Ambulatory Visit (HOSPITAL_BASED_OUTPATIENT_CLINIC_OR_DEPARTMENT_OTHER)
Admission: RE | Admit: 2017-01-06 | Discharge: 2017-01-06 | Disposition: A | Discharge status: Home / Self Care | Payer: BC Managed Care – PPO | Source: Ambulatory Visit | Attending: Plastic Surgery | Admitting: Plastic Surgery

## 2017-01-06 DIAGNOSIS — N651 Disproportion of reconstructed breast: Secondary | ICD-10-CM | POA: Insufficient documentation

## 2017-01-06 DIAGNOSIS — F329 Major depressive disorder, single episode, unspecified: Secondary | ICD-10-CM | POA: Insufficient documentation

## 2017-01-06 DIAGNOSIS — B009 Herpesviral infection, unspecified: Secondary | ICD-10-CM | POA: Insufficient documentation

## 2017-01-06 DIAGNOSIS — Z6841 Body Mass Index (BMI) 40.0 and over, adult: Secondary | ICD-10-CM | POA: Diagnosis not present

## 2017-01-06 DIAGNOSIS — Z79899 Other long term (current) drug therapy: Secondary | ICD-10-CM | POA: Insufficient documentation

## 2017-01-06 DIAGNOSIS — Z8249 Family history of ischemic heart disease and other diseases of the circulatory system: Secondary | ICD-10-CM | POA: Insufficient documentation

## 2017-01-06 DIAGNOSIS — Z853 Personal history of malignant neoplasm of breast: Secondary | ICD-10-CM | POA: Insufficient documentation

## 2017-01-06 DIAGNOSIS — G629 Polyneuropathy, unspecified: Secondary | ICD-10-CM | POA: Insufficient documentation

## 2017-01-06 DIAGNOSIS — M542 Cervicalgia: Secondary | ICD-10-CM | POA: Insufficient documentation

## 2017-01-06 DIAGNOSIS — G35 Multiple sclerosis: Secondary | ICD-10-CM | POA: Diagnosis not present

## 2017-01-06 DIAGNOSIS — M549 Dorsalgia, unspecified: Secondary | ICD-10-CM | POA: Insufficient documentation

## 2017-01-06 DIAGNOSIS — M1990 Unspecified osteoarthritis, unspecified site: Secondary | ICD-10-CM | POA: Diagnosis not present

## 2017-01-06 DIAGNOSIS — F909 Attention-deficit hyperactivity disorder, unspecified type: Secondary | ICD-10-CM | POA: Diagnosis not present

## 2017-01-06 DIAGNOSIS — Z923 Personal history of irradiation: Secondary | ICD-10-CM | POA: Diagnosis not present

## 2017-01-06 DIAGNOSIS — F419 Anxiety disorder, unspecified: Secondary | ICD-10-CM | POA: Insufficient documentation

## 2017-01-06 HISTORY — PX: BREAST REDUCTION SURGERY: SHX8

## 2017-01-06 LAB — BASIC METABOLIC PANEL
ANION GAP: 11 (ref 5–15)
BUN: 18 mg/dL (ref 6–20)
CHLORIDE: 107 mmol/L (ref 101–111)
CO2: 21 mmol/L — AB (ref 22–32)
Calcium: 9 mg/dL (ref 8.9–10.3)
Creatinine, Ser: 0.74 mg/dL (ref 0.44–1.00)
GFR calc non Af Amer: >60 mL/min (ref >60)
GLUCOSE: 103 mg/dL — AB (ref 65–99)
POTASSIUM: 3.8 mmol/L (ref 3.5–5.1)
Sodium: 139 mmol/L (ref 135–145)

## 2017-01-06 LAB — CBC
HEMATOCRIT: 40.9 % (ref 36.0–46.0)
HEMOGLOBIN: 13.7 g/dL (ref 12.0–15.0)
MCH: 31.4 pg (ref 26.0–34.0)
MCHC: 33.5 g/dL (ref 30.0–36.0)
MCV: 93.8 fL (ref 78.0–100.0)
Platelets: 208 10*3/uL (ref 150–400)
RBC: 4.36 MIL/uL (ref 3.87–5.11)
RDW: 13.6 % (ref 11.5–15.5)
WBC: 3.5 10*3/uL — AB (ref 4.0–10.5)

## 2017-01-06 SURGERY — MAMMOPLASTY, REDUCTION
Anesthesia: General | Site: Breast | Laterality: Left

## 2017-01-06 MED ORDER — MIDAZOLAM HCL 2 MG/2ML IJ SOLN
1.0000 mg | INTRAMUSCULAR | Status: DC | PRN
Start: 2017-01-06 — End: 2017-01-06
  Administered 2017-01-06: 2 mg via INTRAVENOUS

## 2017-01-06 MED ORDER — SUFENTANIL CITRATE 50 MCG/ML IV SOLN
INTRAVENOUS | Status: AC
  Filled 2017-01-06: qty 1

## 2017-01-06 MED ORDER — ACETAMINOPHEN 650 MG RE SUPP: 650.0000 mg | RECTAL | Status: DC | PRN

## 2017-01-06 MED ORDER — EPINEPHRINE 30 MG/30ML IJ SOLN
INTRAMUSCULAR | Status: AC
  Filled 2017-01-06: qty 1

## 2017-01-06 MED ORDER — CEFAZOLIN SODIUM-DEXTROSE 2-4 GM/100ML-% IV SOLN
2.0000 g | INTRAVENOUS | Status: AC
  Administered 2017-01-06: 2 g via INTRAVENOUS

## 2017-01-06 MED ORDER — LIDOCAINE HCL (PF) 1 % IJ SOLN
INTRAMUSCULAR | Status: AC
  Filled 2017-01-06: qty 60

## 2017-01-06 MED ORDER — NITROGLYCERIN 2 % TD OINT: 0.5000 [in_us] | TOPICAL_OINTMENT | Freq: Four times a day (QID) | TRANSDERMAL | 0 refills | Status: DC

## 2017-01-06 MED ORDER — MIDAZOLAM HCL 2 MG/2ML IJ SOLN
INTRAMUSCULAR | Status: AC
  Filled 2017-01-06: qty 2

## 2017-01-06 MED ORDER — SODIUM CHLORIDE 0.9 % IV SOLN: 250.0000 mL | INTRAVENOUS | Status: DC | PRN

## 2017-01-06 MED ORDER — ACETAMINOPHEN 325 MG PO TABS: 650.0000 mg | ORAL_TABLET | ORAL | Status: DC | PRN

## 2017-01-06 MED ORDER — SODIUM CHLORIDE 0.9 % IV SOLN
INTRAVENOUS | Status: DC | PRN
  Administered 2017-01-06: 500 mL

## 2017-01-06 MED ORDER — SODIUM CHLORIDE 0.9% FLUSH: 3.0000 mL | INTRAVENOUS | Status: DC | PRN

## 2017-01-06 MED ORDER — LIDOCAINE HCL (CARDIAC) 20 MG/ML IV SOLN
INTRAVENOUS | Status: DC | PRN
  Administered 2017-01-06: 100 mg via INTRAVENOUS

## 2017-01-06 MED ORDER — FENTANYL CITRATE (PF) 100 MCG/2ML IJ SOLN
25.0000 ug | INTRAMUSCULAR | Status: DC | PRN
  Administered 2017-01-06 (×2): 25 ug via INTRAVENOUS

## 2017-01-06 MED ORDER — EPHEDRINE 5 MG/ML INJ
INTRAVENOUS | Status: AC
  Filled 2017-01-06: qty 10

## 2017-01-06 MED ORDER — BUPIVACAINE-EPINEPHRINE 0.25% -1:200000 IJ SOLN
INTRAMUSCULAR | Status: DC | PRN
  Administered 2017-01-06: 10 mL

## 2017-01-06 MED ORDER — SCOPOLAMINE 1 MG/3DAYS TD PT72
MEDICATED_PATCH | TRANSDERMAL | Status: AC
  Filled 2017-01-06: qty 1

## 2017-01-06 MED ORDER — SODIUM CHLORIDE 0.9% FLUSH: 3.0000 mL | Freq: Two times a day (BID) | INTRAVENOUS | Status: DC

## 2017-01-06 MED ORDER — SUFENTANIL CITRATE 50 MCG/ML IV SOLN
INTRAVENOUS | Status: DC | PRN
  Administered 2017-01-06 (×2): 10 ug via INTRAVENOUS
  Administered 2017-01-06: 5 ug via INTRAVENOUS

## 2017-01-06 MED ORDER — PROPOFOL 10 MG/ML IV BOLUS
INTRAVENOUS | Status: DC | PRN
  Administered 2017-01-06: 200 mg via INTRAVENOUS

## 2017-01-06 MED ORDER — SCOPOLAMINE 1 MG/3DAYS TD PT72: 1.0000 | MEDICATED_PATCH | Freq: Once | TRANSDERMAL | Status: DC | PRN

## 2017-01-06 MED ORDER — FENTANYL CITRATE (PF) 100 MCG/2ML IJ SOLN
INTRAMUSCULAR | Status: AC
  Filled 2017-01-06: qty 2

## 2017-01-06 MED ORDER — SODIUM CHLORIDE 0.9 % IJ SOLN
INTRAMUSCULAR | Status: AC
  Filled 2017-01-06: qty 10

## 2017-01-06 MED ORDER — SUCCINYLCHOLINE CHLORIDE 200 MG/10ML IV SOSY
PREFILLED_SYRINGE | INTRAVENOUS | Status: AC
  Filled 2017-01-06: qty 10

## 2017-01-06 MED ORDER — FENTANYL CITRATE (PF) 100 MCG/2ML IJ SOLN: 50.0000 ug | INTRAMUSCULAR | Status: DC | PRN

## 2017-01-06 MED ORDER — LIDOCAINE-EPINEPHRINE 1 %-1:100000 IJ SOLN
INTRAMUSCULAR | Status: AC
  Filled 2017-01-06: qty 1

## 2017-01-06 MED ORDER — DEXAMETHASONE SODIUM PHOSPHATE 10 MG/ML IJ SOLN
INTRAMUSCULAR | Status: AC
  Filled 2017-01-06: qty 1

## 2017-01-06 MED ORDER — PHENYLEPHRINE 40 MCG/ML (10ML) SYRINGE FOR IV PUSH (FOR BLOOD PRESSURE SUPPORT)
PREFILLED_SYRINGE | INTRAVENOUS | Status: AC
  Filled 2017-01-06: qty 10

## 2017-01-06 MED ORDER — LIDOCAINE HCL 1 % IJ SOLN
INTRAVENOUS | Status: DC | PRN
  Administered 2017-01-06: 350 mL

## 2017-01-06 MED ORDER — LACTATED RINGERS IV SOLN
INTRAVENOUS | Status: DC
  Administered 2017-01-06: 07:00:00 via INTRAVENOUS

## 2017-01-06 MED ORDER — PROPOFOL 500 MG/50ML IV EMUL
INTRAVENOUS | Status: AC
  Filled 2017-01-06: qty 50

## 2017-01-06 MED ORDER — DEXAMETHASONE SODIUM PHOSPHATE 4 MG/ML IJ SOLN
INTRAMUSCULAR | Status: DC | PRN
  Administered 2017-01-06: 10 mg via INTRAVENOUS

## 2017-01-06 MED ORDER — LIDOCAINE 2% (20 MG/ML) 5 ML SYRINGE
INTRAMUSCULAR | Status: AC
  Filled 2017-01-06: qty 5

## 2017-01-06 MED ORDER — ONDANSETRON HCL 4 MG/2ML IJ SOLN
INTRAMUSCULAR | Status: AC
  Filled 2017-01-06: qty 2

## 2017-01-06 MED ORDER — BUPIVACAINE-EPINEPHRINE (PF) 0.25% -1:200000 IJ SOLN
INTRAMUSCULAR | Status: AC
  Filled 2017-01-06: qty 60

## 2017-01-06 MED ORDER — ONDANSETRON HCL 4 MG/2ML IJ SOLN: 4.0000 mg | Freq: Once | INTRAMUSCULAR | Status: DC | PRN

## 2017-01-06 MED ORDER — ONDANSETRON HCL 4 MG/2ML IJ SOLN
INTRAMUSCULAR | Status: DC | PRN
  Administered 2017-01-06: 4 mg via INTRAVENOUS

## 2017-01-06 MED ORDER — OXYCODONE HCL 5 MG PO TABS: 5.0000 mg | ORAL_TABLET | ORAL | Status: DC | PRN

## 2017-01-06 SURGICAL SUPPLY — 69 items
ADH SKN CLS APL DERMABOND .7 (GAUZE/BANDAGES/DRESSINGS) ×2
BAG DECANTER FOR FLEXI CONT (MISCELLANEOUS) ×3 IMPLANT
BINDER BREAST LRG (GAUZE/BANDAGES/DRESSINGS) IMPLANT
BINDER BREAST MEDIUM (GAUZE/BANDAGES/DRESSINGS) IMPLANT
BINDER BREAST XLRG (GAUZE/BANDAGES/DRESSINGS) ×2 IMPLANT
BINDER BREAST XXLRG (GAUZE/BANDAGES/DRESSINGS) IMPLANT
BIOPATCH RED 1 DISK 7.0 (GAUZE/BANDAGES/DRESSINGS) IMPLANT
BIOPATCH RED 1IN DISK 7.0MM (GAUZE/BANDAGES/DRESSINGS)
BLADE HEX COATED 2.75 (ELECTRODE) ×3 IMPLANT
BLADE KNIFE PERSONA 10 (BLADE) ×6 IMPLANT
BLADE SURG 15 STRL LF DISP TIS (BLADE) IMPLANT
BLADE SURG 15 STRL SS (BLADE)
BNDG GAUZE ELAST 4 BULKY (GAUZE/BANDAGES/DRESSINGS) ×6 IMPLANT
CANISTER SUCT 1200ML W/VALVE (MISCELLANEOUS) ×3 IMPLANT
CHLORAPREP W/TINT 26ML (MISCELLANEOUS) ×5 IMPLANT
COVER BACK TABLE 60X90IN (DRAPES) ×3 IMPLANT
COVER MAYO STAND STRL (DRAPES) ×3 IMPLANT
DECANTER SPIKE VIAL GLASS SM (MISCELLANEOUS) IMPLANT
DERMABOND ADVANCED (GAUZE/BANDAGES/DRESSINGS) ×4
DERMABOND ADVANCED .7 DNX12 (GAUZE/BANDAGES/DRESSINGS) IMPLANT
DRAIN CHANNEL 19F RND (DRAIN) IMPLANT
DRAPE LAPAROSCOPIC ABDOMINAL (DRAPES) ×3 IMPLANT
DRSG PAD ABDOMINAL 8X10 ST (GAUZE/BANDAGES/DRESSINGS) ×6 IMPLANT
ELECT BLADE 4.0 EZ CLEAN MEGAD (MISCELLANEOUS) ×3
ELECT REM PT RETURN 9FT ADLT (ELECTROSURGICAL) ×3
ELECTRODE BLDE 4.0 EZ CLN MEGD (MISCELLANEOUS) IMPLANT
ELECTRODE REM PT RTRN 9FT ADLT (ELECTROSURGICAL) ×1 IMPLANT
EVACUATOR SILICONE 100CC (DRAIN) IMPLANT
GAUZE SPONGE 4X4 12PLY STRL LF (GAUZE/BANDAGES/DRESSINGS) ×1 IMPLANT
GLOVE BIO SURGEON STRL SZ 6.5 (GLOVE) ×6 IMPLANT
GLOVE BIO SURGEON STRL SZ7 (GLOVE) ×2 IMPLANT
GLOVE BIO SURGEONS STRL SZ 6.5 (GLOVE) ×3
GLOVE EXAM NITRILE MD LF STRL (GLOVE) ×2 IMPLANT
GOWN STRL REUS W/ TWL LRG LVL3 (GOWN DISPOSABLE) ×3 IMPLANT
GOWN STRL REUS W/ TWL XL LVL3 (GOWN DISPOSABLE) IMPLANT
GOWN STRL REUS W/TWL LRG LVL3 (GOWN DISPOSABLE) ×6
GOWN STRL REUS W/TWL XL LVL3 (GOWN DISPOSABLE) ×3
NDL HYPO 25X1 1.5 SAFETY (NEEDLE) ×1 IMPLANT
NDL SAFETY ECLIPSE 18X1.5 (NEEDLE) IMPLANT
NEEDLE HYPO 18GX1.5 SHARP (NEEDLE) ×3
NEEDLE HYPO 25X1 1.5 SAFETY (NEEDLE) ×3 IMPLANT
NS IRRIG 1000ML POUR BTL (IV SOLUTION) IMPLANT
PACK BASIN DAY SURGERY FS (CUSTOM PROCEDURE TRAY) ×3 IMPLANT
PAD ALCOHOL SWAB (MISCELLANEOUS) ×2 IMPLANT
PENCIL BUTTON HOLSTER BLD 10FT (ELECTRODE) ×3 IMPLANT
PIN SAFETY STERILE (MISCELLANEOUS) IMPLANT
SLEEVE SCD COMPRESS KNEE MED (MISCELLANEOUS) ×3 IMPLANT
SPONGE LAP 18X18 X RAY DECT (DISPOSABLE) ×6 IMPLANT
STRIP SUTURE WOUND CLOSURE 1/2 (SUTURE) ×2 IMPLANT
SUT MNCRL AB 4-0 PS2 18 (SUTURE) ×8 IMPLANT
SUT MON AB 3-0 SH 27 (SUTURE) ×9
SUT MON AB 3-0 SH27 (SUTURE) ×1 IMPLANT
SUT MON AB 5-0 PS2 18 (SUTURE) ×8 IMPLANT
SUT PDS 3-0 CT2 (SUTURE)
SUT PDS AB 2-0 CT2 27 (SUTURE) IMPLANT
SUT PDS II 3-0 CT2 27 ABS (SUTURE) IMPLANT
SUT SILK 3 0 PS 1 (SUTURE) IMPLANT
SYR 3ML 23GX1 SAFETY (SYRINGE) ×3 IMPLANT
SYR 50ML LL SCALE MARK (SYRINGE) ×2 IMPLANT
SYR BULB IRRIGATION 50ML (SYRINGE) ×3 IMPLANT
SYR CONTROL 10ML LL (SYRINGE) ×3 IMPLANT
TAPE MEASURE VINYL STERILE (MISCELLANEOUS) ×1 IMPLANT
TOWEL OR 17X24 6PK STRL BLUE (TOWEL DISPOSABLE) ×6 IMPLANT
TUBE CONNECTING 20'X1/4 (TUBING) ×1
TUBE CONNECTING 20X1/4 (TUBING) ×2 IMPLANT
TUBING INFILTRATION IT-10001 (TUBING) ×2 IMPLANT
TUBING SET GRADUATE ASPIR 12FT (MISCELLANEOUS) ×2 IMPLANT
UNDERPAD 30X30 (UNDERPADS AND DIAPERS) ×6 IMPLANT
YANKAUER SUCT BULB TIP NO VENT (SUCTIONS) ×3 IMPLANT

## 2017-01-06 NOTE — Op Note (Signed)
Breast Reduction Op note:    DATE OF PROCEDURE: 01/06/2017  LOCATION: [facility]  SURGEON: Ludger Bones [place] Jaynell Castagnola, DO  ASSISTANT: Shawn Rayburn, PA  PREOPERATIVE DIAGNOSIS 1. Left Breast asymmetry after treatment for breast cancer 2. Macromastia 3. Neck Pain /  Back Pain  POSTOPERATIVE DIAGNOSIS 1. Left breast asymmetry after treatment for breast cancer 2. Macromastia 3. Neck Pain / Back Pain  PROCEDURES 1. Bilateral breast reduction.  Left reduction 656 gm and 390 gm of lipo  COMPLICATIONS: None.  DRAINS: none  INDICATIONS FOR PROCEDURE Ms. Mould is a 56 y.o. year-old female born on Nov 13, 1960,with a history of symptomatic macromastia with concominant back pain, neck pain, shoulder grooving from her bra.   MRN: [id number]  CONSENT Informed consent was obtained directly from the patient. The risks, benefits and alternatives were fully discussed. Specific risks including but not limited to bleeding, infection, hematoma, seroma, scarring, pain, nipple necrosis, asymmetry, poor cosmetic results, and need for further surgery were discussed. The patient had ample opportunity to have her questions answered to her satisfaction.  DESCRIPTION OF PROCEDURE  Patient was brought into the operating room and placed in a supine position.  SCDs were placed and appropriate padding was performed.  Antibiotics were given. The patient underwent general anesthesia and the chest was prepped and draped in a sterile fashion.  A timeout was performed and all information was confirmed to be correct.  Left side: Preoperative markings were confirmed.  Incision lines were injected with 1% Xylocaine with epinephrine.  After waiting for vasoconstriction, the marked lines were incised.  A Wise-pattern superomedial breast reduction was performed by de-epithelializing the pedicle, using bovie to create the superomedial pedicle, and removing breast tissue from the superior, lateral, and  inferior portions of the breast.  Care was taken to not undermine the breast pedicle. Hemostasis was achieved.  The nipple was gently rotated into position and the soft tissue was closed with 4-0 Monocryl.  The patient was sat upright and size and shape symmetry was confirmed.  The pocket was irrigated and hemostasis confirmed.  The deep tissues were approximated with 3-0 monocryl sutures and the skin was closed with deep dermal and subcuticular 4-0 Monocryl sutures. All skin incisions were closed with the 5-0 Monocryl.  Tumescent was placed laterally and liposuction done to remove the lateral excess.  This was for improved contour and symmetry. Dermabond was applied.  Nitro paste was applied to the left nipple areola.  A breast binder and ABDs were placed.  The nipple and skin flaps had good capillary refill at the end of the procedure although slightly slow.  The patient tolerated the procedure well. The patient was allowed to wake from anesthesia and taken to the recovery room in satisfactory condition

## 2017-01-06 NOTE — Anesthesia Postprocedure Evaluation (Signed)
Anesthesia Post Note  Patient: [name]  Procedure(s) Performed: LEFT MAMMARY REDUCTION  (BREAST) FOR SYMMETRY WITH LIPOSUCTION (Left Breast)     Patient location during evaluation: PACU Anesthesia Type: General Level of consciousness: awake and alert, oriented and awake Pain management: pain level controlled Vital Signs Assessment: post-procedure vital signs reviewed and stable Respiratory status: spontaneous breathing, nonlabored ventilation and respiratory function stable Cardiovascular status: blood pressure returned to baseline and stable Postop Assessment: no apparent nausea or vomiting Anesthetic complications: no    Last Vitals:  Vitals:   01/06/17 1030 01/06/17 1100  BP: 134/83 133/77  Pulse: 88 88  Resp: 13 16  Temp:  36.4 C  SpO2: 95% 97%    Last Pain:  Vitals:   01/06/17 1100  TempSrc:   PainSc: 2                  Catalina Gravel

## 2017-01-06 NOTE — Anesthesia Preprocedure Evaluation (Addendum)
Anesthesia Evaluation  Patient identified by MRN, date of birth, ID band Patient awake    Reviewed: Allergy & Precautions, NPO status , Patient's Chart, lab work & pertinent test results  Airway Mallampati: II  TM Distance: >3 FB Neck ROM: Full    Dental  (+) Teeth Intact, Dental Advisory Given Lower bridge:   Pulmonary neg pulmonary ROS,    Pulmonary exam normal breath sounds clear to auscultation       Cardiovascular Exercise Tolerance: Good negative cardio ROS Normal cardiovascular exam Rhythm:Regular Rate:Normal     Neuro/Psych PSYCHIATRIC DISORDERS Anxiety Depression MS    GI/Hepatic negative GI ROS, Neg liver ROS,   Endo/Other  Morbid obesity  Renal/GU negative Renal ROS     Musculoskeletal  (+) Arthritis ,   Abdominal   Peds  (+) ADHD Hematology negative hematology ROS (+)   Anesthesia Other Findings Day of surgery medications reviewed with the patient.  Right breast cancer  Reproductive/Obstetrics                            Anesthesia Physical Anesthesia Plan  ASA: III  Anesthesia Plan: General   Post-op Pain Management:    Induction: Intravenous  PONV Risk Score and Plan: 3 and Midazolam, Dexamethasone, Ondansetron and Scopolamine patch - Pre-op  Airway Management Planned: LMA  Additional Equipment:   Intra-op Plan:   Post-operative Plan: Extubation in OR  Informed Consent: I have reviewed the patients History and Physical, chart, labs and discussed the procedure including the risks, benefits and alternatives for the proposed anesthesia with the patient or authorized representative who has indicated his/her understanding and acceptance.   Dental advisory given  Plan Discussed with: CRNA  Anesthesia Plan Comments: (Risks/benefits of general anesthesia discussed with patient including risk of damage to teeth, lips, gum, and tongue, nausea/vomiting, allergic  reactions to medications, and the possibility of heart attack, stroke and death.  All patient questions answered.  Patient wishes to proceed.)        Anesthesia Quick Evaluation

## 2017-01-06 NOTE — Transfer of Care (Signed)
Immediate Anesthesia Transfer of Care Note  Patient: Raven Montoya  Procedure(s) Performed: LEFT MAMMARY REDUCTION  (BREAST) FOR SYMMETRY WITH LIPOSUCTION (Left Breast)  Patient Location: PACU  Anesthesia Type:General  Level of Consciousness: awake, alert  and oriented  Airway & Oxygen Therapy: Patient Spontanous Breathing and Patient connected to face mask oxygen  Post-op Assessment: Report given to RN  Post vital signs: Reviewed and stable  Last Vitals:  Vitals:   01/06/17 0645  BP: (!) 151/79  Pulse: 75  Resp: 17  Temp: 36.4 C  SpO2: 97%    Last Pain:  Vitals:   01/06/17 0645  TempSrc: Oral         Complications: No apparent anesthesia complications

## 2017-01-06 NOTE — Discharge Instructions (Signed)
Apply 1/2 inch nitropaste to the left nipple areola two or three times a day ( about every 8 hours) for two days. May shower tomorrow Continue breast binder or sports bra No heavy lifting.   Post Anesthesia Home Care Instructions  Activity: Get plenty of rest for the remainder of the day. A responsible individual must stay with you for 24 hours following the procedure.  For the next 24 hours, DO NOT: -Drive a car -Paediatric nurse -Drink alcoholic beverages -Take any medication unless instructed by your physician -Make any legal decisions or sign important papers.  Meals: Start with liquid foods such as gelatin or soup. Progress to regular foods as tolerated. Avoid greasy, spicy, heavy foods. If nausea and/or vomiting occur, drink only clear liquids until the nausea and/or vomiting subsides. Call your physician if vomiting continues.  Special Instructions/Symptoms: Your throat may feel dry or sore from the anesthesia or the breathing tube placed in your throat during surgery. If this causes discomfort, gargle with warm salt water. The discomfort should disappear within 24 hours.  If you had a scopolamine patch placed behind your ear for the management of post- operative nausea and/or vomiting:  1. The medication in the patch is effective for 72 hours, after which it should be removed.  Wrap patch in a tissue and discard in the trash. Wash hands thoroughly with soap and water. 2. You may remove the patch earlier than 72 hours if you experience unpleasant side effects which may include dry mouth, dizziness or visual disturbances. 3. Avoid touching the patch. Wash your hands with soap and water after contact with the patch.

## 2017-01-06 NOTE — H&P (Signed)
Raven Montoya is an 56 y.o. female.   Chief Complaint: breast asymmetry after treatment for breast cancer HPI: The patient is a 56 yrs old female here for her breast reconstruction.  She had Right breast oncoplastic reduction and then revision.  She is doing very well and there is no sign of infection or redness. She has significant asymmetry between her breasts for at least 3 cup size difference.  She has back pain and neck pain as a result from the asymmetry.  The right breast had a partial mastectomy with oncoplastic reconstruction.  We should be able to excise 650 gm from the left breast.  History: Mammogram showed RIGHT architectural distortion with ultrasound 1.7 cm at the 11:30 position.  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  The tumor was ER/PR positive, HER-2 not done, Ki-67 2%.  She has a 20 year history of multiple sclerosis.  Preoperative bra size = 42DD cup.   She has had redness over the right breast consistent with radiation burns and has been using Radioplex cream to the area.   Past Medical History:  Diagnosis Date  . Anemia   . Anxiety   . Arthritis   . Cancer [facility])    right breast  . Depression   . History of radiation therapy 06/02/16-07/14/16   right breast 50.4 Gy in 28 fractions  . Memory loss   . Multiple sclerosis ([place])    age 11  . Neuropathy   . Shingles   . 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 Vision abnormalities   . Wears glasses     Past Surgical History:  Procedure Laterality Date  . ADENOIDECTOMY    . BREAST LUMPECTOMY WITH RADIOACTIVE SEED AND SENTINEL LYMPH NODE BIOPSY Right 03/25/2016   Procedure: RIGHT BREAST SEED GUIDED PARTIAL MASTECTOMY WITH RIGHT SENITINEL LYMPH NODE MAPPING;  Surgeon: Erroll Luna, MD;  Location: [place];  Service: General;  Laterality: Right;  . BREAST RECONSTRUCTION Right 03/25/2016   Procedure: RIGHT BREAST RECONSTRUCTION;  Surgeon:  Loel Lofty Charene Mccallister, DO;  Location: [place];  Service: Plastics;  Laterality: Right;  . BREAST REDUCTION SURGERY Right 03/25/2016   Procedure: MAMMARY REDUCTION  (BREAST) RIGHT;  Surgeon: Loel Lofty Larhonda Dettloff, DO;  Location: [place];  Service: Plastics;  Laterality: Right;  . BUNIONECTOMY    . CESAREAN SECTION    . COLONOSCOPY W/ BIOPSIES AND POLYPECTOMY    . INCISION AND DRAINAGE OF WOUND Right 04/21/2016   Procedure: IRRIGATION AND DEBRIDEMENT RIGHT BREAST WOUND;  Surgeon: Wallace Going, DO;  Location: [place];  Service: Plastics;  Laterality: Right;  . TONSILLECTOMY      Family History  Problem Relation Age of Onset  . Osteoporosis Mother   . Heart disease Mother   . Stroke Mother   . COPD Mother   . Cancer Father        colon  . Heart disease Brother   . Heart attack Brother    Social History:  reports that  has never smoked. she has never used smokeless tobacco. She reports that she drinks alcohol. She reports that she does not use drugs.  Allergies:  Allergies  Allergen Reactions  . Tape Hives    Medications Prior to Admission  Medication Sig Dispense Refill  . amphetamine-dextroamphetamine (ADDERALL XR) 30 MG 24 hr capsule Take 1 capsule (30 mg total) by mouth daily. 30 capsule 0  .  CALCIUM CARBONATE PO Take 250 mg by mouth daily.     . cholecalciferol (VITAMIN D) 1000 units tablet Take 1,000 Units by mouth daily.     . cyclobenzaprine (FLEXERIL) 10 MG tablet Take 1 tablet (10 mg total) by mouth at bedtime as needed. 30 tablet 11  . gabapentin (NEURONTIN) 600 MG tablet Take 1 tablet (600 mg total) by mouth at bedtime. 30 tablet 12  . GILENYA 0.5 MG CAPS TAKE 1 CAPSULE BY MOUTH EVERY DAY 90 capsule 2  . IRON PO Take 1 tablet by mouth 2 (two) times a week. occ     . letrozole (FEMARA) 2.5 MG tablet Take 1 tablet (2.5 mg total) by mouth daily. 90 tablet 3  . meloxicam (MOBIC) 7.5 MG tablet TAKE 1 TO 2 TABLETS BY MOUTH EVERY DAY WITH FOOD AS NEEDED FOR PAIN  180 tablet 1  . Multiple Vitamin (MULTIVITAMIN) tablet Take 1 tablet by mouth daily.    . sertraline (ZOLOFT) 50 MG tablet Take 1 tablet (50 mg total) by mouth daily. 90 tablet 1  . vitamin B-12 (CYANOCOBALAMIN) 1000 MCG tablet Take 1,000 mcg by mouth daily.     . valACYclovir (VALTREX) 1000 MG tablet TAKE 1/2 TABLET BY MOUTH EVERY DAY (Patient not taking: Reported on 05/19/2016) 30 tablet 12    No results found for this or any previous visit (from the past 48 hour(s)). No results found.  Review of Systems  Constitutional: Negative.  Negative for chills and fever.  HENT: Negative.   Eyes: Negative.   Respiratory: Negative.   Cardiovascular: Negative.   Gastrointestinal: Negative.   Genitourinary: Negative.   Musculoskeletal: Negative.   Skin: Negative.   Neurological: Negative.   Psychiatric/Behavioral: Negative.     Blood pressure (!) 151/79, pulse 75, temperature 97.6 F (36.4 C), temperature source Oral, resp. rate 17, height '5\' 3"'  (1.6 m), weight 108.2 kg (238 lb 9.6 oz), last menstrual period 12/30/2014, SpO2 97 %. Physical Exam  Constitutional: She is oriented to person, place, and time. She appears well-developed and well-nourished.  HENT:  Head: Normocephalic and atraumatic.  Eyes: Conjunctivae and EOM are normal. Pupils are equal, round, and reactive to light.  Cardiovascular: Normal rate.  Respiratory: Effort normal.  GI: Soft. She exhibits no distension. There is no tenderness.  Musculoskeletal: Normal range of motion.  Neurological: She is alert and oriented to person, place, and time.  Skin: Skin is warm. No rash noted. No erythema.  Psychiatric: She has a normal mood and affect. Her behavior is normal. Judgment and thought content normal.     Assessment/Plan Plan for left breast reduction for symmetry after right breast partial mastectomy for breast cancer.  [place], DO 01/06/2017, 7:18 AM

## 2017-01-06 NOTE — Anesthesia Procedure Notes (Addendum)
Procedure Name: LMA Insertion Date/Time: 01/06/2017 7:43 AM Performed by: Willa Frater, CRNA Pre-anesthesia Checklist: Patient identified, Emergency Drugs available, Suction available and Patient being monitored Patient Re-evaluated:Patient Re-evaluated prior to induction Oxygen Delivery Method: Circle system utilized Preoxygenation: Pre-oxygenation with 100% oxygen Induction Type: IV induction Ventilation: Mask ventilation without difficulty LMA: LMA inserted LMA Size: 4.0 Number of attempts: 1 Airway Equipment and Method: Bite block Placement Confirmation: positive ETCO2 Tube secured with: Tape Dental Injury: Teeth and Oropharynx as per pre-operative assessment

## 2017-01-07 ENCOUNTER — Encounter (HOSPITAL_BASED_OUTPATIENT_CLINIC_OR_DEPARTMENT_OTHER): Payer: Self-pay | Admitting: Plastic Surgery

## 2017-01-11 ENCOUNTER — Telehealth: Payer: Self-pay

## 2017-01-11 NOTE — Telephone Encounter (Signed)
Left VM for patient regarding bone scan results.  [name] RN

## 2017-01-17 ENCOUNTER — Telehealth: Payer: Self-pay | Admitting: *Deleted

## 2017-01-17 NOTE — Telephone Encounter (Signed)
Day.  I need to r/s her 01/18/17 appt. with RAS, due to inclement weather/fim

## 2017-01-18 ENCOUNTER — Ambulatory Visit: Payer: BC Managed Care – PPO | Admitting: Neurology

## 2017-01-19 ENCOUNTER — Ambulatory Visit (INDEPENDENT_AMBULATORY_CARE_PROVIDER_SITE_OTHER): Payer: BC Managed Care – PPO | Admitting: Neurology

## 2017-01-19 ENCOUNTER — Other Ambulatory Visit: Payer: Self-pay

## 2017-01-19 ENCOUNTER — Encounter: Payer: Self-pay | Admitting: Neurology

## 2017-01-19 VITALS — BP 157/94 | HR 106 | Resp 18 | Ht 63.0 in | Wt 235.0 lb

## 2017-01-19 DIAGNOSIS — R269 Unspecified abnormalities of gait and mobility: Secondary | ICD-10-CM

## 2017-01-19 DIAGNOSIS — G35 Multiple sclerosis: Secondary | ICD-10-CM | POA: Diagnosis not present

## 2017-01-19 DIAGNOSIS — F988 Other specified behavioral and emotional disorders with onset usually occurring in childhood and adolescence: Secondary | ICD-10-CM | POA: Diagnosis not present

## 2017-01-19 DIAGNOSIS — R5383 Other fatigue: Secondary | ICD-10-CM | POA: Diagnosis not present

## 2017-01-19 DIAGNOSIS — C50919 Malignant neoplasm of unspecified site of unspecified female breast: Secondary | ICD-10-CM

## 2017-01-19 DIAGNOSIS — F418 Other specified anxiety disorders: Secondary | ICD-10-CM

## 2017-01-19 MED ORDER — MELOXICAM 15 MG PO TABS: ORAL_TABLET | ORAL | 3 refills | Status: DC

## 2017-01-19 MED ORDER — AMPHETAMINE-DEXTROAMPHET ER 30 MG PO CP24: 30.0000 mg | ORAL_CAPSULE | Freq: Every day | ORAL | 0 refills | Status: DC

## 2017-01-19 MED ORDER — CYCLOBENZAPRINE HCL 10 MG PO TABS: 10.0000 mg | ORAL_TABLET | Freq: Every evening | ORAL | 3 refills | Status: DC | PRN

## 2017-01-19 NOTE — Patient Instructions (Signed)
Try doing piriformis muscle stretch exercises. You can search for these on youtubecom.

## 2017-01-19 NOTE — Progress Notes (Signed)
[facility]  PATIENT: Raven Montoya DOB: 05/03/1960  REFERRING CLINICIAN: Anastasia Pall HISTORY FROM: Patient REASON FOR VISIT: MS   HISTORICAL  CHIEF COMPLAINT:  Chief Complaint  Patient presents with  . Multiple Sclerosis    Sts. she continues to tolerate Gilenya well.  Has had mult. surgeries related to breast cancer.  C/O more numbness/pain in bilat legs, left worse than right. Some lbp. Feels more scattered cognitively. Tripping more.  Not sure if these sx. are related to MS, or stress due to surgeries, tx. for breast ca./fim    HISTORY OF PRESENT ILLNESS:  Raven Montoya is a 56 year old woman with MS.      Update 01/19/2017:   She has not had any definite MS exacerbation but she does feel that she is tripping more and has more numbness in her legs. She also notes that she feels more cognitively foggy. An MRI of the brain from January 2017 have been stable compared to 2015 MRI.  She notes that she stumbles frequently but she has not had any significant fall. She denies any major weakness though the legs do feel little heavier. She also notes a little bit more numbness in the legs. Left > right.   She has mildl left buttock pain also.. She occasionally will have tingling in the arms. Bladder and bowel are doing well with just mild urgency and some nocturia now.   She does not note any MS related visual problems. She has had more fatigue and sleepiness this year. A PSG has shown snoring but no obstructive sleep apnea. Adderall has helped the fatigue is sleepiness. She has sleep maintenance insomnia, worse the past month.  She takes 1/2 gabapentin most nights.  She no longer takes Flexeril which helped her at night.   She has mild depression she denies any significant cognitive issues.          Earlier this year, she was diagnosed with right breast cancer. She has had surgery and radiation.   She also has had left sided surgery.     From 07/15/2016: Breast cancer:    She was diagnosed with stage 1A breast cancer and had radiation after lumpectomy.   She just finished [facility] .    MS:    She is on Gilenya and she tolerates it well.   She denies any exacerbation or new MS symptoms.     She tolerates it well.   Her last MRI 02/12/2015 was unchanged compared to 11/17/2013 MRI (around time she started Gilenya).      Gait/strength/sensation:    Her gait is doing well. She has no recent falls. She denies any significant weakness or numbness in the arms or legs. She will feel some tingling in the hands at times but this is not painful.    Bladder/bowel:   She denies any significant issues with these.  Vision:   She denies any MS related vision problem.   She wears reading glasses.    Fatigue/Sleep:   She notes more fatigue and sleepiness this year.    She drives a school bus.   PSG showed snoring but no OSA.   Her fatigue and sleepiness are better on  Adderall.   She has sleep maintenance insomnia but no sleep onset issues.    Trazodone and Flexeril at bedtime have not helped.     Mood:   She is noting mildly more depression and anxiety.  Her mom dies in May 06, 2022 and she was diagnosed with  Breast ca in the past 6 months.  .     Cognition:    She notes mild difficulties with attention and focus which have improvement with Adderall.  MS History:  She was diagnosed with multiple sclerosis at age 31 after presenting with optic neuritis in the left eye. Her neurologist discussed with her that she probably had MS but no medication was started at that time. Vision got better over the next few weeks. A couple years later, she went to see Dr. Effie Shy. He felt that the MS should be treated and she was started on Rebif. She remain on Rebif for many years   She had not had any major exacerbations while on Rebif but had needles fatigue.    In 2014, she switched to [place]. She tolerates Gilenya well.   REVIEW OF SYSTEMS:  Constitutional: No fevers, chills, sweats, or change in  appetite.   She has fatigue and mild insomnia Eyes: No visual changes, double vision, eye pain Ear, nose and throat: No hearing loss, ear pain, nasal congestion, sore throat Cardiovascular: No chest pain, palpitations Respiratory:  No shortness of breath at rest or with exertion.   No wheezes GastrointestinaI: No nausea, vomiting, diarrhea, abdominal pain, fecal incontinence Genitourinary:  No dysuria, urinary retention or frequency.  No nocturia. Musculoskeletal:  Mild neck pain, no major back pain Integumentary: No rash, pruritus, skin lesions Neurological: as above Psychiatric: as above.    Endocrine: No palpitations, diaphoresis, change in appetite, change in weigh or increased thirst Hematologic/Lymphatic:  No anemia, purpura, petechiae. Allergic/Immunologic: No itchy/runny eyes, nasal congestion, recent allergic reactions, rashes  ALLERGIES: Allergies  Allergen Reactions  . Tape Hives    HOME MEDICATIONS: Outpatient Medications Prior to Visit  Medication Sig Dispense Refill  . CALCIUM CARBONATE PO Take 250 mg by mouth daily.     . cholecalciferol (VITAMIN D) 1000 units tablet Take 1,000 Units by mouth daily.     Marland Kitchen gabapentin (NEURONTIN) 600 MG tablet Take 1 tablet (600 mg total) by mouth at bedtime. 30 tablet 12  . GILENYA 0.5 MG CAPS TAKE 1 CAPSULE BY MOUTH EVERY DAY 90 capsule 2  . letrozole (FEMARA) 2.5 MG tablet Take 1 tablet (2.5 mg total) by mouth daily. 90 tablet 3  . Multiple Vitamin (MULTIVITAMIN) tablet Take 1 tablet by mouth daily.    . sertraline (ZOLOFT) 50 MG tablet Take 1 tablet (50 mg total) by mouth daily. 90 tablet 1  . vitamin B-12 (CYANOCOBALAMIN) 1000 MCG tablet Take 1,000 mcg by mouth daily.     Marland Kitchen amphetamine-dextroamphetamine (ADDERALL XR) 30 MG 24 hr capsule Take 1 capsule (30 mg total) by mouth daily. 30 capsule 0  . cyclobenzaprine (FLEXERIL) 10 MG tablet Take 1 tablet (10 mg total) by mouth at bedtime as needed. 30 tablet 11  . meloxicam (MOBIC) 7.5  MG tablet TAKE 1 TO 2 TABLETS BY MOUTH EVERY DAY WITH FOOD AS NEEDED FOR PAIN 180 tablet 1  . IRON PO Take 1 tablet by mouth 2 (two) times a week. occ     . nitroGLYCERIN (NITROGLYN) 2 % ointment Apply 0.5 inches topically every 6 (six) hours for 2 days. 30 g 0  . valACYclovir (VALTREX) 1000 MG tablet TAKE 1/2 TABLET BY MOUTH EVERY DAY (Patient not taking: Reported on 05/19/2016) 30 tablet 12   Facility-Administered Medications Prior to Visit  Medication Dose Route Frequency Provider Last Rate Last Dose  . gadopentetate dimeglumine (MAGNEVIST) injection 20 mL  20 mL Intravenous Once  PRN Britt Bottom, MD        PAST MEDICAL HISTORY: Past Medical History:  Diagnosis Date  . Anemia   . Anxiety   . Arthritis   . Cancer [facility])    right breast  . Depression   . History of radiation therapy 06/02/16-07/14/16   right breast 50.4 Gy in 28 fractions  . Memory loss   . Multiple sclerosis ([place])    age 29  . Neuropathy   . Shingles   . 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 Vision abnormalities   . Wears glasses     PAST SURGICAL HISTORY: Past Surgical History:  Procedure Laterality Date  . ADENOIDECTOMY    . BREAST LUMPECTOMY WITH RADIOACTIVE SEED AND SENTINEL LYMPH NODE BIOPSY Right 03/25/2016   Procedure: RIGHT BREAST SEED GUIDED PARTIAL MASTECTOMY WITH RIGHT SENITINEL LYMPH NODE MAPPING;  Surgeon: Erroll Luna, MD;  Location: [place];  Service: General;  Laterality: Right;  . BREAST RECONSTRUCTION Right 03/25/2016   Procedure: RIGHT BREAST RECONSTRUCTION;  Surgeon: Loel Lofty Dillingham, DO;  Location: [place];  Service: Plastics;  Laterality: Right;  . BREAST REDUCTION SURGERY Right 03/25/2016   Procedure: MAMMARY REDUCTION  (BREAST) RIGHT;  Surgeon: Loel Lofty Dillingham, DO;  Location: [place];  Service: Plastics;  Laterality: Right;  . BREAST REDUCTION SURGERY Left 01/06/2017   Procedure: LEFT MAMMARY REDUCTION  (BREAST) FOR SYMMETRY WITH LIPOSUCTION;  Surgeon: Wallace Going, DO;  Location: [place];  Service: Plastics;  Laterality: Left;  . BUNIONECTOMY    . CESAREAN SECTION    . COLONOSCOPY W/ BIOPSIES AND POLYPECTOMY    . INCISION AND DRAINAGE OF WOUND Right 04/21/2016   Procedure: IRRIGATION AND DEBRIDEMENT RIGHT BREAST WOUND;  Surgeon: Wallace Going, DO;  Location: [place];  Service: Plastics;  Laterality: Right;  . TONSILLECTOMY      FAMILY HISTORY: Family History  Problem Relation Age of Onset  . Osteoporosis Mother   . Heart disease Mother   . Stroke Mother   . COPD Mother   . Cancer Father        colon  . Heart disease Brother   . Heart attack Brother     SOCIAL HISTORY:  Social History   Socioeconomic History  . Marital status: Divorced    Spouse name: Not on file  . Number of children: 1  . Years of education: Not on file  . Highest education level: Not on file  Social Needs  . Financial resource strain: Not on file  . Food insecurity - worry: Not on file  . Food insecurity - inability: Not on file  . Transportation needs - medical: Not on file  . Transportation needs - non-medical: Not on file  Occupational History  . Occupation: Recruitment consultant  Tobacco Use  . Smoking status: Never Smoker  . Smokeless tobacco: Never Used  Substance and Sexual Activity  . Alcohol use: Yes    Alcohol/week: 0.0 oz    Comment: social  . Drug use: No  . Sexual activity: Yes    Partners: Male    Birth control/protection: Condom    Comment: partner has had vasectomy  Other Topics Concern  . Not on file  Social History Narrative  . Not on file     PHYSICAL EXAM  Vitals:   01/19/17 1507  BP: (!) 157/94  Pulse: (!) 106  Resp: 18  Weight: 235 lb (106.6 kg)  Height: 5\' 3"  (1.6 m)    Body mass index is 41.63 kg/m.   General: The patient is well-developed and well-nourished and in no acute distress  Musculoskeletal:   She has mild tenderness at the occiput the upper paraspinal muscles.  There is moderate tenderness over the left piriformis muscle and milder tenderness over the left trochanteric bursa  Neurologic Exam  Mental status: The patient is alert and oriented x 3 at the time of the examination. The patient has apparent normal recent and remote memory, with an apparently normal attention span and concentration ability.   Speech is normal.  Cranial nerves: Extraocular movements are full. Facial strength and sensation is normal. Trapezius strength strength is normal. There is no dysarthria. The tongue is midline, and the patient has symmetric elevation of the soft palate. No obvious hearing deficits are noted.  Motor:  Muscle bulk and tone are normal. Strength is  5 / 5 in all 4 extremities.   Sensory: Sensory testing is intact to touch and vibration sensation.  Coordination: Cerebellar testing reveals good finger-nose-finger bilaterally.  Gait and station: Station and gait are normal. Tandem gait is mildly wide. Romberg is negative.   Reflexes: Deep tendon reflexes are symmetric and brisk bilaterally. Marland Kitchen    DIAGNOSTIC DATA (LABS, IMAGING, TESTING) - I reviewed patient records, labs, notes, testing and imaging myself where available.  Lab Results  Component Value Date   WBC 3.5 (L) 01/06/2017   HGB 13.7 01/06/2017   HCT 40.9 01/06/2017   MCV 93.8 01/06/2017   PLT 208 01/06/2017      Component Value Date/Time   NA 139 01/06/2017 0700   NA 143 02/18/2016 1247   K 3.8 01/06/2017 0700   K 3.9 02/18/2016 1247   CL 107 01/06/2017 0700   CO2 21 (L) 01/06/2017 0700   CO2 27 02/18/2016 1247   GLUCOSE 103 (H) 01/06/2017 0700   GLUCOSE 83 02/18/2016 1247   BUN 18 01/06/2017 0700   BUN 18.8 02/18/2016 1247   CREATININE 0.74 01/06/2017 0700   CREATININE 0.8 02/18/2016 1247   CALCIUM 9.0 01/06/2017 0700   CALCIUM 9.5 02/18/2016 1247   PROT 6.3 (L) 03/23/2016 1503   PROT 7.2 02/18/2016 1247   ALBUMIN 4.1 03/23/2016 1503   ALBUMIN 4.2 02/18/2016 1247   AST 33  03/23/2016 1503   AST 31 02/18/2016 1247   ALT 31 03/23/2016 1503   ALT 45 02/18/2016 1247   ALKPHOS 66 03/23/2016 1503   ALKPHOS 109 02/18/2016 1247   BILITOT 0.5 03/23/2016 1503   BILITOT 0.80 02/18/2016 1247   GFRNONAA >60 01/06/2017 0700   GFRAA >60 01/06/2017 0700   Lab Results  Component Value Date   CHOL 161 10/17/2012   HDL 48 10/17/2012   LDLCALC 66 10/17/2012   TRIG 236 (H) 10/17/2012   CHOLHDL 3.4 10/17/2012   Lab Results  Component Value Date   HGBA1C 5.4 10/17/2012   No results found for: VITAMINB12 Lab Results  Component Value Date   TSH 1.71 04/01/2015      ASSESSMENT AND PLAN Multiple sclerosis ([place]) - Plan: MR BRAIN W WO CONTRAST, MR CERVICAL SPINE W WO CONTRAST  Other fatigue  Attention deficit disorder, unspecified hyperactivity presence  Depression with anxiety  Gait disturbance  Malignant neoplasm of female breast, unspecified estrogen receptor status, unspecified laterality, unspecified site of breast ([place])   1.  Her MS appears to be stable. She will continue Gilenya. She has recent lab work.   Check MRI  of the brain and spine due to her increased weakness and numbness.  If there are significant changes, consider switching from Gilenya to 1 of the intravenous medications  2.  She is advised to exercise and stay active.  3.  Continue Adderall for her Fatigue, sleepiness and attention  4.  I advised her on piriformis stretch exercises for her back pain. If she is not any better in a few weeks to check an MRI to assess for lumbar radiculopathy. 5.   She will come back to see [place] in about 6 months or call sooner if she has new or worsening neurologic symptoms.   Richard A. Felecia Shelling, MD, PhD 26/33/3545, 6:25 PM Certified in Neurology, Clinical Neurophysiology, Sleep Medicine, Pain Medicine and Neuroimaging  [facility] [address], [place], [place] [address] [phone]

## 2017-01-25 ENCOUNTER — Ambulatory Visit (INDEPENDENT_AMBULATORY_CARE_PROVIDER_SITE_OTHER): Payer: BC Managed Care – PPO

## 2017-01-25 DIAGNOSIS — G35 Multiple sclerosis: Secondary | ICD-10-CM

## 2017-01-25 MED ORDER — GADOPENTETATE DIMEGLUMINE 469.01 MG/ML IV SOLN: 20.0000 mL | Freq: Once | INTRAVENOUS | Status: AC | PRN

## 2017-01-28 ENCOUNTER — Telehealth: Payer: Self-pay | Admitting: *Deleted

## 2017-01-28 NOTE — Telephone Encounter (Signed)
-----   Message from Britt Bottom, MD sent at 01/27/2017  5:52 PM EST ----- Please let the patient know that the brain MRI does not show any new lesions

## 2017-01-28 NOTE — Telephone Encounter (Signed)
Spoke with pt. this am and reviewed below MRI result.  She verbalized understanding of same/fim

## 2017-02-17 ENCOUNTER — Encounter (HOSPITAL_BASED_OUTPATIENT_CLINIC_OR_DEPARTMENT_OTHER): Payer: Self-pay | Admitting: *Deleted

## 2017-02-18 DIAGNOSIS — Z9889 Other specified postprocedural states: Secondary | ICD-10-CM | POA: Insufficient documentation

## 2017-02-18 DIAGNOSIS — Z923 Personal history of irradiation: Secondary | ICD-10-CM | POA: Insufficient documentation

## 2017-02-23 ENCOUNTER — Ambulatory Visit: Payer: Self-pay | Admitting: Plastic Surgery

## 2017-02-23 DIAGNOSIS — S21009A Unspecified open wound of unspecified breast, initial encounter: Secondary | ICD-10-CM

## 2017-02-24 ENCOUNTER — Ambulatory Visit (HOSPITAL_BASED_OUTPATIENT_CLINIC_OR_DEPARTMENT_OTHER): Payer: BC Managed Care – PPO | Admitting: Anesthesiology

## 2017-02-24 ENCOUNTER — Other Ambulatory Visit: Payer: Self-pay

## 2017-02-24 ENCOUNTER — Encounter (HOSPITAL_BASED_OUTPATIENT_CLINIC_OR_DEPARTMENT_OTHER)
Admission: RE | Disposition: A | Discharge status: Home / Self Care | Payer: Self-pay | Source: Ambulatory Visit | Attending: Plastic Surgery

## 2017-02-24 ENCOUNTER — Ambulatory Visit (HOSPITAL_BASED_OUTPATIENT_CLINIC_OR_DEPARTMENT_OTHER)
Admission: RE | Admit: 2017-02-24 | Discharge: 2017-02-24 | Disposition: A | Discharge status: Home / Self Care | Payer: BC Managed Care – PPO | Source: Ambulatory Visit | Attending: Plastic Surgery | Admitting: Plastic Surgery

## 2017-02-24 ENCOUNTER — Encounter (HOSPITAL_BASED_OUTPATIENT_CLINIC_OR_DEPARTMENT_OTHER): Payer: Self-pay | Admitting: Anesthesiology

## 2017-02-24 DIAGNOSIS — F909 Attention-deficit hyperactivity disorder, unspecified type: Secondary | ICD-10-CM | POA: Insufficient documentation

## 2017-02-24 DIAGNOSIS — F419 Anxiety disorder, unspecified: Secondary | ICD-10-CM | POA: Insufficient documentation

## 2017-02-24 DIAGNOSIS — F329 Major depressive disorder, single episode, unspecified: Secondary | ICD-10-CM | POA: Insufficient documentation

## 2017-02-24 DIAGNOSIS — Z6841 Body Mass Index (BMI) 40.0 and over, adult: Secondary | ICD-10-CM | POA: Insufficient documentation

## 2017-02-24 DIAGNOSIS — Z8249 Family history of ischemic heart disease and other diseases of the circulatory system: Secondary | ICD-10-CM | POA: Insufficient documentation

## 2017-02-24 DIAGNOSIS — C50411 Malignant neoplasm of upper-outer quadrant of right female breast: Secondary | ICD-10-CM | POA: Diagnosis not present

## 2017-02-24 DIAGNOSIS — Z888 Allergy status to other drugs, medicaments and biological substances status: Secondary | ICD-10-CM | POA: Diagnosis not present

## 2017-02-24 DIAGNOSIS — M1990 Unspecified osteoarthritis, unspecified site: Secondary | ICD-10-CM | POA: Diagnosis not present

## 2017-02-24 DIAGNOSIS — Z923 Personal history of irradiation: Secondary | ICD-10-CM | POA: Diagnosis not present

## 2017-02-24 DIAGNOSIS — Z170 Estrogen receptor positive status [ER+]: Secondary | ICD-10-CM | POA: Insufficient documentation

## 2017-02-24 DIAGNOSIS — Z79899 Other long term (current) drug therapy: Secondary | ICD-10-CM | POA: Diagnosis not present

## 2017-02-24 DIAGNOSIS — S21009A Unspecified open wound of unspecified breast, initial encounter: Secondary | ICD-10-CM

## 2017-02-24 DIAGNOSIS — G35 Multiple sclerosis: Secondary | ICD-10-CM | POA: Diagnosis not present

## 2017-02-24 DIAGNOSIS — Y838 Other surgical procedures as the cause of abnormal reaction of the patient, or of later complication, without mention of misadventure at the time of the procedure: Secondary | ICD-10-CM | POA: Insufficient documentation

## 2017-02-24 DIAGNOSIS — T8189XA Other complications of procedures, not elsewhere classified, initial encounter: Secondary | ICD-10-CM | POA: Diagnosis not present

## 2017-02-24 HISTORY — PX: MASS EXCISION: SHX2000

## 2017-02-24 LAB — POCT HEMOGLOBIN-HEMACUE: Hemoglobin: 13.3 g/dL (ref 12.0–15.0)

## 2017-02-24 SURGERY — EXCISION MASS
Anesthesia: General | Site: Breast | Laterality: Left

## 2017-02-24 MED ORDER — ACETAMINOPHEN 650 MG RE SUPP: 650.0000 mg | RECTAL | Status: DC | PRN

## 2017-02-24 MED ORDER — OXYCODONE HCL 5 MG/5ML PO SOLN: 5.0000 mg | Freq: Once | ORAL | Status: DC | PRN

## 2017-02-24 MED ORDER — ACETAMINOPHEN 160 MG/5ML PO SOLN: 325.0000 mg | ORAL | Status: DC | PRN

## 2017-02-24 MED ORDER — FENTANYL CITRATE (PF) 100 MCG/2ML IJ SOLN
50.0000 ug | INTRAMUSCULAR | Status: DC | PRN
  Administered 2017-02-24: 100 ug via INTRAVENOUS

## 2017-02-24 MED ORDER — MEPERIDINE HCL 25 MG/ML IJ SOLN
6.2500 mg | INTRAMUSCULAR | Status: DC | PRN
Start: 2017-02-24 — End: 2017-02-24

## 2017-02-24 MED ORDER — ACETAMINOPHEN 325 MG PO TABS: 650.0000 mg | ORAL_TABLET | ORAL | Status: DC | PRN

## 2017-02-24 MED ORDER — SODIUM CHLORIDE 0.9% FLUSH: 3.0000 mL | INTRAVENOUS | Status: DC | PRN

## 2017-02-24 MED ORDER — MIDAZOLAM HCL 2 MG/2ML IJ SOLN
INTRAMUSCULAR | Status: AC
  Filled 2017-02-24: qty 2

## 2017-02-24 MED ORDER — KETOROLAC TROMETHAMINE 30 MG/ML IJ SOLN: 30.0000 mg | Freq: Once | INTRAMUSCULAR | Status: DC | PRN

## 2017-02-24 MED ORDER — LIDOCAINE-EPINEPHRINE 1 %-1:100000 IJ SOLN
INTRAMUSCULAR | Status: AC
  Filled 2017-02-24: qty 1

## 2017-02-24 MED ORDER — LIDOCAINE HCL (CARDIAC) 20 MG/ML IV SOLN
INTRAVENOUS | Status: DC | PRN
  Administered 2017-02-24: 50 mg via INTRAVENOUS

## 2017-02-24 MED ORDER — ONDANSETRON HCL 4 MG/2ML IJ SOLN
INTRAMUSCULAR | Status: AC
  Filled 2017-02-24: qty 2

## 2017-02-24 MED ORDER — DEXAMETHASONE SODIUM PHOSPHATE 4 MG/ML IJ SOLN
INTRAMUSCULAR | Status: DC | PRN
  Administered 2017-02-24: 10 mg via INTRAVENOUS

## 2017-02-24 MED ORDER — FENTANYL CITRATE (PF) 100 MCG/2ML IJ SOLN
25.0000 ug | INTRAMUSCULAR | Status: DC | PRN
Start: 2017-02-24 — End: 2017-02-24

## 2017-02-24 MED ORDER — ONDANSETRON HCL 4 MG/2ML IJ SOLN
INTRAMUSCULAR | Status: DC | PRN
  Administered 2017-02-24: 4 mg via INTRAVENOUS

## 2017-02-24 MED ORDER — CEFAZOLIN SODIUM-DEXTROSE 2-4 GM/100ML-% IV SOLN
2.0000 g | INTRAVENOUS | Status: AC
  Administered 2017-02-24: 2 g via INTRAVENOUS

## 2017-02-24 MED ORDER — SUCCINYLCHOLINE CHLORIDE 200 MG/10ML IV SOSY
PREFILLED_SYRINGE | INTRAVENOUS | Status: AC
  Filled 2017-02-24: qty 10

## 2017-02-24 MED ORDER — SODIUM CHLORIDE 0.9 % IV SOLN: 250.0000 mL | INTRAVENOUS | Status: DC | PRN

## 2017-02-24 MED ORDER — PROPOFOL 10 MG/ML IV BOLUS
INTRAVENOUS | Status: DC | PRN
  Administered 2017-02-24: 200 mg via INTRAVENOUS

## 2017-02-24 MED ORDER — LACTATED RINGERS IV SOLN
INTRAVENOUS | Status: DC
  Administered 2017-02-24 (×2): via INTRAVENOUS

## 2017-02-24 MED ORDER — DEXAMETHASONE SODIUM PHOSPHATE 10 MG/ML IJ SOLN
INTRAMUSCULAR | Status: AC
  Filled 2017-02-24: qty 1

## 2017-02-24 MED ORDER — SCOPOLAMINE 1 MG/3DAYS TD PT72: 1.0000 | MEDICATED_PATCH | Freq: Once | TRANSDERMAL | Status: DC | PRN

## 2017-02-24 MED ORDER — ONDANSETRON HCL 4 MG/2ML IJ SOLN: 4.0000 mg | Freq: Once | INTRAMUSCULAR | Status: DC | PRN

## 2017-02-24 MED ORDER — LIDOCAINE 2% (20 MG/ML) 5 ML SYRINGE
INTRAMUSCULAR | Status: AC
  Filled 2017-02-24: qty 5

## 2017-02-24 MED ORDER — SODIUM CHLORIDE 0.9 % IV SOLN
INTRAVENOUS | Status: DC | PRN
  Administered 2017-02-24: 500 mL

## 2017-02-24 MED ORDER — FENTANYL CITRATE (PF) 100 MCG/2ML IJ SOLN
INTRAMUSCULAR | Status: AC
  Filled 2017-02-24: qty 2

## 2017-02-24 MED ORDER — OXYCODONE HCL 5 MG PO TABS: 5.0000 mg | ORAL_TABLET | Freq: Once | ORAL | Status: DC | PRN

## 2017-02-24 MED ORDER — CEFAZOLIN SODIUM-DEXTROSE 2-4 GM/100ML-% IV SOLN
INTRAVENOUS | Status: AC
  Filled 2017-02-24: qty 100

## 2017-02-24 MED ORDER — ACETAMINOPHEN 325 MG PO TABS: 325.0000 mg | ORAL_TABLET | ORAL | Status: DC | PRN

## 2017-02-24 MED ORDER — SODIUM CHLORIDE 0.9% FLUSH: 3.0000 mL | Freq: Two times a day (BID) | INTRAVENOUS | Status: DC

## 2017-02-24 MED ORDER — MIDAZOLAM HCL 2 MG/2ML IJ SOLN
1.0000 mg | INTRAMUSCULAR | Status: DC | PRN
  Administered 2017-02-24: 2 mg via INTRAVENOUS

## 2017-02-24 SURGICAL SUPPLY — 73 items
ADH SKN CLS APL DERMABOND .7 (GAUZE/BANDAGES/DRESSINGS)
APL SKNCLS STERI-STRIP NONHPOA (GAUZE/BANDAGES/DRESSINGS)
BENZOIN TINCTURE PRP APPL 2/3 (GAUZE/BANDAGES/DRESSINGS) IMPLANT
BLADE CLIPPER SURG (BLADE) IMPLANT
BLADE SURG 15 STRL LF DISP TIS (BLADE) ×1 IMPLANT
BLADE SURG 15 STRL SS (BLADE) ×3
BNDG CONFORM 2 STRL LF (GAUZE/BANDAGES/DRESSINGS) IMPLANT
BNDG ELASTIC 2X5.8 VLCR STR LF (GAUZE/BANDAGES/DRESSINGS) IMPLANT
CANISTER SUCT 1200ML W/VALVE (MISCELLANEOUS) IMPLANT
CHLORAPREP W/TINT 26ML (MISCELLANEOUS) IMPLANT
CLEANER CAUTERY TIP 5X5 PAD (MISCELLANEOUS) IMPLANT
CLOSURE WOUND 1/2 X4 (GAUZE/BANDAGES/DRESSINGS)
CORD BIPOLAR FORCEPS 12FT (ELECTRODE) IMPLANT
COVER BACK TABLE 60X90IN (DRAPES) ×3 IMPLANT
COVER MAYO STAND STRL (DRAPES) ×3 IMPLANT
DECANTER SPIKE VIAL GLASS SM (MISCELLANEOUS) IMPLANT
DERMABOND ADVANCED (GAUZE/BANDAGES/DRESSINGS)
DERMABOND ADVANCED .7 DNX12 (GAUZE/BANDAGES/DRESSINGS) IMPLANT
DRAPE LAPAROTOMY 100X72 PEDS (DRAPES) IMPLANT
DRAPE U-SHAPE 76X120 STRL (DRAPES) ×3 IMPLANT
DRSG TEGADERM 2-3/8X2-3/4 SM (GAUZE/BANDAGES/DRESSINGS) IMPLANT
DRSG TEGADERM 4X4.75 (GAUZE/BANDAGES/DRESSINGS) IMPLANT
ELECT COATED BLADE 2.86 ST (ELECTRODE) ×2 IMPLANT
ELECT NDL BLADE 2-5/6 (NEEDLE) IMPLANT
ELECT NEEDLE BLADE 2-5/6 (NEEDLE) IMPLANT
ELECT REM PT RETURN 9FT ADLT (ELECTROSURGICAL) ×3
ELECT REM PT RETURN 9FT PED (ELECTROSURGICAL)
ELECTRODE REM PT RETRN 9FT PED (ELECTROSURGICAL) IMPLANT
ELECTRODE REM PT RTRN 9FT ADLT (ELECTROSURGICAL) IMPLANT
GAUZE SPONGE 4X4 12PLY STRL LF (GAUZE/BANDAGES/DRESSINGS) IMPLANT
GLOVE BIO SURGEON STRL SZ 6.5 (GLOVE) ×4 IMPLANT
GLOVE BIO SURGEONS STRL SZ 6.5 (GLOVE) ×2
GOWN STRL REUS W/ TWL LRG LVL3 (GOWN DISPOSABLE) ×2 IMPLANT
GOWN STRL REUS W/TWL LRG LVL3 (GOWN DISPOSABLE) ×6
KIT DRSG PREVENA PLUS 7DAY 125 (MISCELLANEOUS) ×2 IMPLANT
MATRIX SURGICAL PSM 7X10CM (Tissue) ×2 IMPLANT
MICROMATRIX 1000MG (Tissue) ×6 IMPLANT
NDL HYPO 30GX1 BEV (NEEDLE) IMPLANT
NDL PRECISIONGLIDE 27X1.5 (NEEDLE) ×1 IMPLANT
NEEDLE HYPO 30GX1 BEV (NEEDLE) IMPLANT
NEEDLE PRECISIONGLIDE 27X1.5 (NEEDLE) ×3 IMPLANT
NS IRRIG 1000ML POUR BTL (IV SOLUTION) IMPLANT
PACK BASIN DAY SURGERY FS (CUSTOM PROCEDURE TRAY) ×3 IMPLANT
PAD CLEANER CAUTERY TIP 5X5 (MISCELLANEOUS)
PENCIL BUTTON HOLSTER BLD 10FT (ELECTRODE) ×2 IMPLANT
RUBBERBAND STERILE (MISCELLANEOUS) IMPLANT
SHEET MEDIUM DRAPE 40X70 STRL (DRAPES) IMPLANT
SLEEVE SCD COMPRESS KNEE MED (MISCELLANEOUS) ×2 IMPLANT
SOLUTION PARTIC MCRMTRX 1000MG (Tissue) IMPLANT
SPONGE GAUZE 2X2 8PLY STER LF (GAUZE/BANDAGES/DRESSINGS)
SPONGE GAUZE 2X2 8PLY STRL LF (GAUZE/BANDAGES/DRESSINGS) IMPLANT
STRIP CLOSURE SKIN 1/2X4 (GAUZE/BANDAGES/DRESSINGS) IMPLANT
SUCTION FRAZIER HANDLE 10FR (MISCELLANEOUS)
SUCTION TUBE FRAZIER 10FR DISP (MISCELLANEOUS) IMPLANT
SUT MNCRL 6-0 UNDY P1 1X18 (SUTURE) IMPLANT
SUT MNCRL AB 3-0 PS2 18 (SUTURE) IMPLANT
SUT MNCRL AB 4-0 PS2 18 (SUTURE) ×2 IMPLANT
SUT MON AB 5-0 P3 18 (SUTURE) IMPLANT
SUT MON AB 5-0 PS2 18 (SUTURE) IMPLANT
SUT MONOCRYL 6-0 P1 1X18 (SUTURE)
SUT PROLENE 5 0 P 3 (SUTURE) IMPLANT
SUT PROLENE 5 0 PS 2 (SUTURE) IMPLANT
SUT PROLENE 6 0 P 1 18 (SUTURE) IMPLANT
SUT VIC AB 5-0 P-3 18X BRD (SUTURE) IMPLANT
SUT VIC AB 5-0 P3 18 (SUTURE)
SUT VIC AB 5-0 PS2 18 (SUTURE) ×2 IMPLANT
SUT VICRYL 4-0 PS2 18IN ABS (SUTURE) ×2 IMPLANT
SYR BULB 3OZ (MISCELLANEOUS) ×2 IMPLANT
SYR CONTROL 10ML LL (SYRINGE) ×3 IMPLANT
TOWEL OR 17X24 6PK STRL BLUE (TOWEL DISPOSABLE) ×3 IMPLANT
TRAY DSU PREP LF (CUSTOM PROCEDURE TRAY) ×3 IMPLANT
TUBE CONNECTING 20'X1/4 (TUBING)
TUBE CONNECTING 20X1/4 (TUBING) IMPLANT

## 2017-02-24 NOTE — Discharge Instructions (Signed)
Keep vac in place and don't remove.        Post Anesthesia Home Care Instructions  Activity: Get plenty of rest for the remainder of the day. A responsible individual must stay with you for 24 hours following the procedure.  For the next 24 hours, DO NOT: -Drive a car -Paediatric nurse -Drink alcoholic beverages -Take any medication unless instructed by your physician -Make any legal decisions or sign important papers.  Meals: Start with liquid foods such as gelatin or soup. Progress to regular foods as tolerated. Avoid greasy, spicy, heavy foods. If nausea and/or vomiting occur, drink only clear liquids until the nausea and/or vomiting subsides. Call your physician if vomiting continues.  Special Instructions/Symptoms: Your throat may feel dry or sore from the anesthesia or the breathing tube placed in your throat during surgery. If this causes discomfort, gargle with warm salt water. The discomfort should disappear within 24 hours.  If you had a scopolamine patch placed behind your ear for the management of post- operative nausea and/or vomiting:  1. The medication in the patch is effective for 72 hours, after which it should be removed.  Wrap patch in a tissue and discard in the trash. Wash hands thoroughly with soap and water. 2. You may remove the patch earlier than 72 hours if you experience unpleasant side effects which may include dry mouth, dizziness or visual disturbances. 3. Avoid touching the patch. Wash your hands with soap and water after contact with the patch.

## 2017-02-24 NOTE — Anesthesia Postprocedure Evaluation (Signed)
Anesthesia Post Note  Patient: Raven Montoya  Procedure(s) Performed: LEFT BREAST WOUND EXCISION AND CLOSURE WITH ACELL APPLICATION (Left Breast)     Patient location during evaluation: PACU Anesthesia Type: General Level of consciousness: sedated and patient cooperative Pain management: pain level controlled Vital Signs Assessment: post-procedure vital signs reviewed and stable Respiratory status: spontaneous breathing Cardiovascular status: stable Anesthetic complications: no    Last Vitals:  Vitals:   02/24/17 1147 02/24/17 1200  BP:  (!) 165/96  Pulse: 82   Resp: 17 16  Temp:  37 C  SpO2: 98% 99%    Last Pain:  Vitals:   02/24/17 1200  TempSrc:   PainSc: 0-No pain                 Nolon Nations

## 2017-02-24 NOTE — Anesthesia Procedure Notes (Signed)
Procedure Name: LMA Insertion Date/Time: 02/24/2017 10:30 AM Performed by: Willa Frater, CRNA Pre-anesthesia Checklist: Patient identified, Emergency Drugs available, Suction available and Patient being monitored Patient Re-evaluated:Patient Re-evaluated prior to induction Oxygen Delivery Method: Circle system utilized Preoxygenation: Pre-oxygenation with 100% oxygen Induction Type: IV induction Ventilation: Mask ventilation without difficulty LMA: LMA inserted LMA Size: 4.0 Number of attempts: 1 Airway Equipment and Method: Bite block Placement Confirmation: positive ETCO2 Tube secured with: Tape Dental Injury: Teeth and Oropharynx as per pre-operative assessment

## 2017-02-24 NOTE — Transfer of Care (Signed)
Immediate Anesthesia Transfer of Care Note  Patient: Raven Montoya  Procedure(s) Performed: LEFT BREAST WOUND EXCISION AND CLOSURE WITH ACELL APPLICATION (Left Breast)  Patient Location: PACU  Anesthesia Type:General  Level of Consciousness: awake, alert  and oriented  Airway & Oxygen Therapy: Patient Spontanous Breathing and Patient connected to face mask oxygen  Post-op Assessment: Report given to RN and Post -op Vital signs reviewed and stable  Post vital signs: Reviewed and stable  Last Vitals:  Vitals:   02/24/17 0813  BP: 137/79  Pulse: 82  Resp: 18  Temp: 36.5 C  SpO2: 100%    Last Pain:  Vitals:   02/24/17 0813  TempSrc: Oral      Patients Stated Pain Goal: 0 (93/11/21 6244)  Complications: No apparent anesthesia complications

## 2017-02-24 NOTE — H&P (Signed)
Raven Montoya is an 57 y.o. female.   Chief Complaint: left breast wound HPI:  The patient is a 57 yrs old wf her for treatment of her left breast wound.  She underwent a right breast oncoplastic reduction followed by a left side reduction.  She developed a late seroma that was drained successfully in the office.  She developed a 1 cm area of skin breakdown due to the pressure.  There is now a capsule cavity.  We are now going to see if we can irrigate and closure the capsule and opening.  The redness is completely resolved.  She has been packing the area daily.  History: Mammogram showed RIGHT architectural distortion with ultrasound 1.7 cm at the 11:30 position. 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 The tumor was ER/PR positive, HER-2 not done, Ki-67 2%. She has a 20 year history of multiple sclerosis. Preoperative bra size = 42DD cup.  She has had redness over the right breast consistent with radiation burns and has been using Radioplex cream to the area.    Past Medical History:  Diagnosis Date  . Anemia   . Anxiety   . Arthritis   . Cancer [facility])    right breast  . Depression   . History of radiation therapy 06/02/16-07/14/16   right breast 50.4 Gy in 28 fractions  . Memory loss   . Multiple sclerosis ([place])    age 49  . Neuropathy   . Shingles   . 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 Vision abnormalities   . Wears glasses     Past Surgical History:  Procedure Laterality Date  . ADENOIDECTOMY    . BREAST LUMPECTOMY WITH RADIOACTIVE SEED AND SENTINEL LYMPH NODE BIOPSY Right 03/25/2016   Procedure: RIGHT BREAST SEED GUIDED PARTIAL MASTECTOMY WITH RIGHT SENITINEL LYMPH NODE MAPPING;  Surgeon: Erroll Luna, MD;  Location: [place];  Service: General;  Laterality: Right;  . BREAST RECONSTRUCTION Right 03/25/2016   Procedure: RIGHT BREAST RECONSTRUCTION;  Surgeon: Loel Lofty Dillingham, DO;   Location: [place];  Service: Plastics;  Laterality: Right;  . BREAST REDUCTION SURGERY Right 03/25/2016   Procedure: MAMMARY REDUCTION  (BREAST) RIGHT;  Surgeon: Loel Lofty Dillingham, DO;  Location: [place];  Service: Plastics;  Laterality: Right;  . BREAST REDUCTION SURGERY Left 01/06/2017   Procedure: LEFT MAMMARY REDUCTION  (BREAST) FOR SYMMETRY WITH LIPOSUCTION;  Surgeon: Wallace Going, DO;  Location: [place];  Service: Plastics;  Laterality: Left;  . BUNIONECTOMY    . CESAREAN SECTION    . COLONOSCOPY W/ BIOPSIES AND POLYPECTOMY    . INCISION AND DRAINAGE OF WOUND Right 04/21/2016   Procedure: IRRIGATION AND DEBRIDEMENT RIGHT BREAST WOUND;  Surgeon: Wallace Going, DO;  Location: [place];  Service: Plastics;  Laterality: Right;  . TONSILLECTOMY      Family History  Problem Relation Age of Onset  . Osteoporosis Mother   . Heart disease Mother   . Stroke Mother   . COPD Mother   . Cancer Father        colon  . Heart disease Brother   . Heart attack Brother    Social History:  reports that  has never smoked. she has never used smokeless tobacco. She reports that she drinks alcohol. She reports that she does not use drugs.  Allergies:  Allergies  Allergen Reactions  . Tape Hives  Medications Prior to Admission  Medication Sig Dispense Refill  . amphetamine-dextroamphetamine (ADDERALL XR) 30 MG 24 hr capsule Take 1 capsule (30 mg total) by mouth daily. 30 capsule 0  . CALCIUM CARBONATE PO Take 250 mg by mouth daily.     . cholecalciferol (VITAMIN D) 1000 units tablet Take 1,000 Units by mouth daily.     . cyclobenzaprine (FLEXERIL) 10 MG tablet Take 1 tablet (10 mg total) by mouth at bedtime as needed. 90 tablet 3  . GILENYA 0.5 MG CAPS TAKE 1 CAPSULE BY MOUTH EVERY DAY 90 capsule 2  . IRON PO Take 1 tablet by mouth 2 (two) times a week. occ     . letrozole (FEMARA) 2.5 MG tablet Take 1 tablet (2.5 mg total) by mouth daily. 90 tablet  3  . meloxicam (MOBIC) 15 MG tablet TAKE 1 PO EVERY DAY WITH FOOD AS NEEDED FOR PAIN 90 tablet 3  . Multiple Vitamin (MULTIVITAMIN) tablet Take 1 tablet by mouth daily.    . sertraline (ZOLOFT) 50 MG tablet Take 1 tablet (50 mg total) by mouth daily. 90 tablet 1  . vitamin B-12 (CYANOCOBALAMIN) 1000 MCG tablet Take 1,000 mcg by mouth daily.     Marland Kitchen gabapentin (NEURONTIN) 600 MG tablet Take 1 tablet (600 mg total) by mouth at bedtime. 30 tablet 12  . valACYclovir (VALTREX) 1000 MG tablet TAKE 1/2 TABLET BY MOUTH EVERY DAY (Patient not taking: Reported on 05/19/2016) 30 tablet 12    Results for orders placed or performed during the hospital encounter of 02/24/17 (from the past 48 hour(s))  Hemoglobin-hemacue, POC     Status: None   Collection Time: 02/24/17  8:29 AM  Result Value Ref Range   Hemoglobin 13.3 12.0 - 15.0 g/dL   No results found.  Review of Systems  Constitutional: Negative.   HENT: Negative.   Eyes: Negative.   Respiratory: Negative.   Cardiovascular: Negative.   Genitourinary: Negative.   Musculoskeletal: Negative.   Skin: Negative.   Neurological: Negative.   Psychiatric/Behavioral: Negative.     Blood pressure 137/79, pulse 82, temperature 97.7 F (36.5 C), temperature source Oral, resp. rate 18, height _0  (1.6 m), weight 109.1 kg (240 lb 9.6 oz), last menstrual period 12/30/2014, SpO2 100 %. Physical Exam  Constitutional: She is oriented to person, place, and time. She appears well-developed and well-nourished.  HENT:  Head: Normocephalic and atraumatic.  Eyes: EOM are normal. Pupils are equal, round, and reactive to light.  Cardiovascular: Normal rate.  Respiratory: Effort normal. No respiratory distress.    GI: Soft. She exhibits no distension.  Musculoskeletal: Normal range of motion. She exhibits no edema or tenderness.  Neurological: She is alert and oriented to person, place, and time.  Skin: Skin is warm.  Psychiatric: She has a normal mood and  affect. Her behavior is normal. Judgment and thought content normal.     Assessment/Plan Plan for left breast debridement excision of wound with possible closure versus acell and Vac placement.  [place], DO 02/24/2017, 10:14 AM

## 2017-02-24 NOTE — Op Note (Signed)
DATE OF OPERATION: 02/24/2017  LOCATION: [facility]  PREOPERATIVE DIAGNOSIS: left breast wound  POSTOPERATIVE DIAGNOSIS: Same  PROCEDURE: left breast wound preparation 3 x 4 x 15 cm for placement of Acell (7 x 10 cm sheet and 2 gm powder) and Prevena VAC.  SURGEON: Valentina Alcoser Sanger Isak Sotomayor, DO  EBL: 5 cc  CONDITION: Stable  COMPLICATIONS: None  INDICATION: The patient, Raven Montoya, is a 57 y.o. female born on 13-Sep-1960, is here for treatment of a left breast wound after a reduction for symmetry due to right breast cancer.   PROCEDURE DETAILS:  The patient was seen prior to surgery and marked.  The IV antibiotics were given. The patient was taken to the operating room and given a general anesthetic. A standard time out was performed and all information was confirmed by those in the room. SCDs were placed.   The left breast was prepped and draped.  The pocket was irrigated with antibiotic solution and saline.  The wound is 3 x 4 x 15 deep.  It was clean and the bovie was used to create a rough surface so the deep area will scar.  All of the Acell powder and sheet was placed in the wound.  An adaptic was applied.  KY placed and the VAC placed in and on the wound.  There was an excellent seal.  A breast binder was placed.  The patient was allowed to wake up and taken to recovery room in stable condition at the end of the case. The family was notified at the end of the case.

## 2017-02-24 NOTE — Anesthesia Preprocedure Evaluation (Addendum)
Anesthesia Evaluation  Patient identified by MRN, date of birth, ID band Patient awake    Reviewed: Allergy & Precautions, NPO status , Patient's Chart, lab work & pertinent test results  Airway Mallampati: II  TM Distance: >3 FB Neck ROM: Full    Dental  (+) Teeth Intact, Dental Advisory Given Lower bridge:   Pulmonary neg pulmonary ROS,    Pulmonary exam normal breath sounds clear to auscultation       Cardiovascular Exercise Tolerance: Good negative cardio ROS Normal cardiovascular exam Rhythm:Regular Rate:Normal     Neuro/Psych PSYCHIATRIC DISORDERS Anxiety Depression MS    GI/Hepatic negative GI ROS, Neg liver ROS,   Endo/Other  Morbid obesity  Renal/GU negative Renal ROS     Musculoskeletal  (+) Arthritis ,   Abdominal (+) + obese,   Peds  (+) ADHD Hematology   Anesthesia Other Findings Day of surgery medications reviewed with the patient.  Right breast cancer  Reproductive/Obstetrics                             Anesthesia Physical  Anesthesia Plan  ASA: III  Anesthesia Plan: General   Post-op Pain Management:    Induction: Intravenous  PONV Risk Score and Plan: 3 and Midazolam, Dexamethasone, Ondansetron and Scopolamine patch - Pre-op  Airway Management Planned: LMA  Additional Equipment:   Intra-op Plan:   Post-operative Plan: Extubation in OR  Informed Consent: I have reviewed the patients History and Physical, chart, labs and discussed the procedure including the risks, benefits and alternatives for the proposed anesthesia with the patient or authorized representative who has indicated his/her understanding and acceptance.   Dental advisory given  Plan Discussed with: CRNA  Anesthesia Plan Comments: (Risks/benefits of general anesthesia discussed with patient including risk of damage to teeth, lips, gum, and tongue, nausea/vomiting, allergic reactions to  medications, and the possibility of heart attack, stroke and death.  All patient questions answered.  Patient wishes to proceed.)        Anesthesia Quick Evaluation

## 2017-02-25 ENCOUNTER — Encounter (HOSPITAL_BASED_OUTPATIENT_CLINIC_OR_DEPARTMENT_OTHER): Payer: Self-pay | Admitting: Plastic Surgery

## 2017-02-25 NOTE — Addendum Note (Signed)
Addendum  created 02/25/17 1030 by Aslynn Brunetti, Ernesta Amble, CRNA   Charge Capture section accepted

## 2017-03-04 ENCOUNTER — Other Ambulatory Visit: Payer: Self-pay | Admitting: Neurology

## 2017-03-09 ENCOUNTER — Telehealth: Payer: Self-pay | Admitting: Neurology

## 2017-03-09 MED ORDER — AMPHETAMINE-DEXTROAMPHET ER 30 MG PO CP24: 30.0000 mg | ORAL_CAPSULE | Freq: Every day | ORAL | 0 refills | Status: DC

## 2017-03-09 NOTE — Telephone Encounter (Signed)
Last OV and refill 01/19/17. Rx printed, awaiting sig

## 2017-03-09 NOTE — Telephone Encounter (Signed)
Pt requesting a refill for amphetamine-dextroamphetamine (ADDERALL XR) 30 MG 24 hr capsule

## 2017-03-10 NOTE — Telephone Encounter (Signed)
Rx at the front desk.

## 2017-04-13 ENCOUNTER — Other Ambulatory Visit (HOSPITAL_COMMUNITY)
Admission: RE | Admit: 2017-04-13 | Discharge: 2017-04-13 | Disposition: A | Discharge status: Home / Self Care | Payer: BC Managed Care – PPO | Source: Ambulatory Visit | Attending: Certified Nurse Midwife | Admitting: Certified Nurse Midwife

## 2017-04-13 ENCOUNTER — Ambulatory Visit: Payer: BC Managed Care – PPO | Admitting: Certified Nurse Midwife

## 2017-04-13 ENCOUNTER — Other Ambulatory Visit: Payer: Self-pay

## 2017-04-13 ENCOUNTER — Encounter: Payer: Self-pay | Admitting: Certified Nurse Midwife

## 2017-04-13 VITALS — BP 140/90 | HR 70 | Resp 16 | Ht 63.25 in | Wt 238.0 lb

## 2017-04-13 DIAGNOSIS — Z01419 Encounter for gynecological examination (general) (routine) without abnormal findings: Secondary | ICD-10-CM | POA: Diagnosis not present

## 2017-04-13 DIAGNOSIS — Z124 Encounter for screening for malignant neoplasm of cervix: Secondary | ICD-10-CM

## 2017-04-13 DIAGNOSIS — Z0000 Encounter for general adult medical examination without abnormal findings: Secondary | ICD-10-CM

## 2017-04-13 DIAGNOSIS — R350 Frequency of micturition: Secondary | ICD-10-CM | POA: Diagnosis not present

## 2017-04-13 DIAGNOSIS — A6009 Herpesviral infection of other urogenital tract: Secondary | ICD-10-CM

## 2017-04-13 DIAGNOSIS — Z853 Personal history of malignant neoplasm of breast: Secondary | ICD-10-CM | POA: Diagnosis not present

## 2017-04-13 LAB — POCT URINALYSIS DIPSTICK
BILIRUBIN UA: NEGATIVE
Blood, UA: NEGATIVE
Glucose, UA: NEGATIVE
KETONES UA: NEGATIVE
Leukocytes, UA: NEGATIVE
Nitrite, UA: NEGATIVE
PH UA: 5 (ref 5.0–8.0)
Protein, UA: NEGATIVE
UROBILINOGEN UA: NEGATIVE U/dL — AB

## 2017-04-13 MED ORDER — VALACYCLOVIR HCL 1 G PO TABS: 500.0000 mg | ORAL_TABLET | Freq: Every day | ORAL | 12 refills | Status: DC

## 2017-04-13 NOTE — Patient Instructions (Signed)

## 2017-04-13 NOTE — Progress Notes (Signed)
57 y.o. [id number] Divorced  Caucasian Fe here for annual exam. Menopausal no HRT. Denies vaginal bleeding or vaginal dryness. Now completed final surgery of breast reduction surgery and wound vac issues, still having some drainage, but area is being packed and now healing from 15 cm area to 2cm now. Right breast lumpectomy and 1 lymph node removal for breast cancer. Now back at work driving the bus! 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 Seeing PCP yearly and labs.Sees Neurology for MS still stable, anxiety, ADHD, Flexeril management. Still under oncology follow up with breast cancer. Having some urinary frequency but no urgency or pain. Feels like she is dry in that area. Denies fever, chills, nausea or back pain. No urine odor and appears clear. Drinking adequate water daily. No other health concerns today. Emotionally doing well!  Patient's last menstrual period was 12/30/2014.          Sexually active: Yes.    The current method of family planning is post menopausal status.    Exercising: No.  exercise Smoker:  no  Health Maintenance: Pap:  03-28-14 neg HPV HR neg History of Abnormal Pap: no MMG:  01-05-17 category a density birads 2:neg, breast cancer Self Breast exams: yes Colonoscopy: 2017 f/u 3 yrs BMD:  2018 normal TDaP: 2013 Shingles: no Pneumonia: no Hep C and HIV: both neg 2017 Labs: poct urine-neg   reports that  has never smoked. she has never used smokeless tobacco. She reports that she drinks alcohol. She reports that she does not use drugs.  Past Medical History:  Diagnosis Date  . Anemia   . Anxiety   . Arthritis   . Cancer [facility])    right breast  . Depression   . History of radiation therapy 06/02/16-07/14/16   right breast 50.4 Gy in 28 fractions  . Memory loss   . Multiple sclerosis ([place])    age 89  . Neuropathy   . Shingles   . 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 Vision abnormalities   . Wears glasses     Past Surgical History:   Procedure Laterality Date  . ADENOIDECTOMY    . BREAST LUMPECTOMY WITH RADIOACTIVE SEED AND SENTINEL LYMPH NODE BIOPSY Right 03/25/2016   Procedure: RIGHT BREAST SEED GUIDED PARTIAL MASTECTOMY WITH RIGHT SENITINEL LYMPH NODE MAPPING;  Surgeon: Erroll Luna, MD;  Location: [place];  Service: General;  Laterality: Right;  . BREAST RECONSTRUCTION Right 03/25/2016   Procedure: RIGHT BREAST RECONSTRUCTION;  Surgeon: Loel Lofty Dillingham, DO;  Location: [place];  Service: Plastics;  Laterality: Right;  . BREAST REDUCTION SURGERY Right 03/25/2016   Procedure: MAMMARY REDUCTION  (BREAST) RIGHT;  Surgeon: Loel Lofty Dillingham, DO;  Location: [place];  Service: Plastics;  Laterality: Right;  . BREAST REDUCTION SURGERY Left 01/06/2017   Procedure: LEFT MAMMARY REDUCTION  (BREAST) FOR SYMMETRY WITH LIPOSUCTION;  Surgeon: Wallace Going, DO;  Location: [place];  Service: Plastics;  Laterality: Left;  . BUNIONECTOMY    . CESAREAN SECTION    . COLONOSCOPY W/ BIOPSIES AND POLYPECTOMY    . INCISION AND DRAINAGE OF WOUND Right 04/21/2016   Procedure: IRRIGATION AND DEBRIDEMENT RIGHT BREAST WOUND;  Surgeon: Wallace Going, DO;  Location: [place];  Service: Plastics;  Laterality: Right;  . MASS EXCISION Left 02/24/2017   Procedure: LEFT BREAST WOUND EXCISION AND CLOSURE WITH ACELL APPLICATION;  Surgeon: Wallace Going, DO;  Location:   [facility];  Service: Plastics;  Laterality: Left;  . TONSILLECTOMY      Current Outpatient Medications  Medication Sig Dispense Refill  . amphetamine-dextroamphetamine (ADDERALL XR) 30 MG 24 hr capsule Take 1 capsule (30 mg total) by mouth daily. 30 capsule 0  . CALCIUM CARBONATE PO Take 250 mg by mouth daily.     . cholecalciferol (VITAMIN D) 1000 units tablet Take 1,000 Units by mouth daily.     . cyclobenzaprine (FLEXERIL) 10 MG tablet Take 1 tablet (10 mg total) by mouth at bedtime as needed. 90 tablet 3  . GILENYA  0.5 MG CAPS TAKE 1 CAPSULE BY MOUTH EVERY DAY 90 capsule 2  . IRON PO Take 1 tablet by mouth 2 (two) times a week. occ     . meloxicam (MOBIC) 15 MG tablet TAKE 1 PO EVERY DAY WITH FOOD AS NEEDED FOR PAIN 90 tablet 3  . Multiple Vitamin (MULTIVITAMIN) tablet Take 1 tablet by mouth daily.    . sertraline (ZOLOFT) 50 MG tablet Take 1 tablet (50 mg total) by mouth daily. 90 tablet 1  . valACYclovir (VALTREX) 1000 MG tablet TAKE 1/2 TABLET BY MOUTH EVERY DAY 30 tablet 12  . vitamin B-12 (CYANOCOBALAMIN) 1000 MCG tablet Take 1,000 mcg by mouth daily.      No current facility-administered medications for this visit.    Facility-Administered Medications Ordered in Other Visits  Medication Dose Route Frequency Provider Last Rate Last Dose  . gadopentetate dimeglumine (MAGNEVIST) injection 20 mL  20 mL Intravenous Once PRN Sater, Richard A, MD      . gadopentetate dimeglumine (MAGNEVIST) injection 20 mL  20 mL Intravenous Once PRN Sater, Nanine Means, MD        Family History  Problem Relation Age of Onset  . Osteoporosis Mother   . Heart disease Mother   . Stroke Mother   . COPD Mother   . Cancer Father        colon  . Heart disease Brother   . Heart attack Brother     ROS:  Pertinent items are noted in HPI.  Otherwise, a comprehensive ROS was negative.  Exam:   BP 140/90   Pulse 70   Resp 16   Ht 5' 3.25" (1.607 m)   Wt 238 lb (108 kg)   LMP 12/30/2014   BMI 41.83 kg/m  Height: 5' 3.25" (160.7 cm) Ht Readings from Last 3 Encounters:  04/13/17 5' 3.25" (1.607 m)  02/24/17 5\' 3"  (1.6 m)  01/19/17 5\' 3"  (1.6 m)    General appearance: alert, cooperative and appears stated age Head: Normocephalic, without obvious abnormality, atraumatic Neck: no adenopathy, supple, symmetrical, trachea midline and thyroid normal to inspection and palpation Lungs: clear to auscultation bilaterally CVAT bilateral negative Breasts: normal appearance, no masses or tenderness, No nipple retraction or  dimpling, No nipple discharge or bleeding, No axillary or supraclavicular adenopathy  Scarring noted bilateral Heart: regular rate and rhythm Abdomen: soft, non-tender; no masses,  no organomegaly, negative suprapubic Extremities: extremities normal, atraumatic, no cyanosis or edema Skin: Skin color, texture, turgor normal. No rashes or lesions Lymph nodes: Cervical, supraclavicular, and axillary nodes normal. No abnormal inguinal nodes palpated Neurologic: Grossly normal   Pelvic: External genitalia:  no lesions,normal female              Urethra:  normal appearing urethra with no masses, tenderness or lesions  Bladder and urethra meatus non tender, but dryness noted around area  Bartholin's and Skene's: normal                 Vagina: normal appearing vagina with normal color and discharge, no lesions, dryness at introitus noted              Cervix: no cervical motion tenderness, no lesions and retroverted              Pap taken: Yes.   Bimanual Exam:  Uterus:  normal size, contour, position, consistency, mobility, non-tender and anteverted              Adnexa: normal adnexa and no mass, fullness, tenderness               Rectovaginal: Confirms               Anus:  normal sphincter tone, no lesions  Chaperone present: yes  A:  Well Woman with normal exam  Menopausal no HRT  Vulva dryness  Urinary frequency feel related to dryness, urine negative  Recovering from right breast cancer and reduction mammoplasty bilateral and radiation right only, with oncology follow up  Neurology management of MS and other medications  PCP for aex/labs as needed  HSV history with outbreaks in past year, needs refill  P:   Reviewed health and wellness pertinent to exam  Aware of need to advise if vaginal bleeding.  Discussed vulva dryness noted and feel this is the frequency cause with urine negative. Encouraged to use coconut oil around area of meatus and introitus. Warning signs of UTI  given and need to advise if occurs. Questions addressed  Continue follow up with Oncology as indicated and do SBE/mammogram as indicated.  Continue follow with other MD as needed   Rx Valtrex see order with instructions  Pap smear: yes   counseled on breast self exam, mammography screening, feminine hygiene, adequate intake of calcium and vitamin D, diet and exercise  return annually or prn  An After Visit Summary was printed and given to the patient.

## 2017-04-15 LAB — CYTOLOGY - PAP
Adequacy: ABSENT
DIAGNOSIS: NEGATIVE
HPV (WINDOPATH): NOT DETECTED

## 2017-04-25 ENCOUNTER — Other Ambulatory Visit: Payer: Self-pay | Admitting: Neurology

## 2017-04-25 MED ORDER — AMPHETAMINE-DEXTROAMPHET ER 30 MG PO CP24: 30.0000 mg | ORAL_CAPSULE | Freq: Every day | ORAL | 0 refills | Status: DC

## 2017-04-25 NOTE — Telephone Encounter (Signed)
Last filled on 03/09/17. Will you send this electronically or would you like me to print it for you to sign.

## 2017-04-25 NOTE — Telephone Encounter (Signed)
Rx signed and at the front. Patient made aware.

## 2017-04-25 NOTE — Addendum Note (Signed)
Addended by: Belinda Block A on: 04/25/2017 09:52 AM   Modules accepted: Orders

## 2017-04-25 NOTE — Telephone Encounter (Signed)
Pt calling for refill of amphetamine-dextroamphetamine (ADDERALL XR) 30 MG 24 hr capsule

## 2017-04-28 ENCOUNTER — Telehealth: Payer: Self-pay | Admitting: *Deleted

## 2017-04-28 NOTE — Telephone Encounter (Signed)
DOT form completed and given directly back to pt./fim

## 2017-05-04 ENCOUNTER — Ambulatory Visit: Payer: BC Managed Care – PPO | Admitting: Hematology and Oncology

## 2017-05-09 ENCOUNTER — Inpatient Hospital Stay: Payer: BC Managed Care – PPO | Attending: Hematology and Oncology | Admitting: Hematology and Oncology

## 2017-05-09 ENCOUNTER — Telehealth: Payer: Self-pay | Admitting: Hematology and Oncology

## 2017-05-09 DIAGNOSIS — Z170 Estrogen receptor positive status [ER+]: Secondary | ICD-10-CM | POA: Insufficient documentation

## 2017-05-09 DIAGNOSIS — C50411 Malignant neoplasm of upper-outer quadrant of right female breast: Secondary | ICD-10-CM | POA: Insufficient documentation

## 2017-05-09 DIAGNOSIS — Z79811 Long term (current) use of aromatase inhibitors: Secondary | ICD-10-CM | POA: Insufficient documentation

## 2017-05-09 MED ORDER — LETROZOLE 2.5 MG PO TABS: 2.5000 mg | ORAL_TABLET | Freq: Every day | ORAL | 3 refills | Status: DC

## 2017-05-09 NOTE — Telephone Encounter (Signed)
Gave patient AVs and calendar of upcoming April 2020 appointments.  °

## 2017-05-09 NOTE — Progress Notes (Signed)
Patient Care Team: [name], MD as PCP - General (Internal Medicine) Erroll Luna, MD as Consulting Physician (General Surgery) [name], MD as Consulting Physician (Hematology and Oncology) Gery Pray, MD as Consulting Physician (Radiation Oncology) Delice Bison Charlestine Massed, NP as Nurse Practitioner (Hematology and Oncology)  DIAGNOSIS:  Encounter Diagnosis  Name Primary?  . Malignant neoplasm of upper-outer quadrant of right breast in female, estrogen receptor positive (Raven Montoya)     SUMMARY OF ONCOLOGIC HISTORY:   Malignant neoplasm of upper-outer quadrant of right female breast (Raven Montoya)   02/17/2016 Initial Diagnosis    Screening detected right breast architectural distortion, lean year density by ultrasound 1.7 cm at the 11:30 position axilla negative. Ultrasound biopsy: Low-grade DCIS with? Microscopic grade 1 invasive ductal carcinoma ER/PR positive HER-2 could not be done Ki-67 2%; T1a N0 stage IA      03/25/2016 Surgery    Right lumpectomy: IDC 0.4 cm with DCIS, margins negative, 0/1 lymph node negative, right mammoplasty: Benign, ER 100%, PR 100%, HER-2 negative ratio 1.77, Ki-67 2%, T1a N0 stage IA      06/02/2016 - 07/14/2016 Radiation Therapy    Adjuvant radiation therapy      08/06/2016 -  Anti-estrogen oral therapy    Letrozole 2.5 mg daily       CHIEF COMPLIANT: Follow-up on letrozole therapy  INTERVAL HISTORY: Raven Montoya is a 57 year old with above-mentioned history of right breast cancer treated with lumpectomy radiation is currently on letrozole therapy.  She is tolerating it fairly well.  She has intermittent hot flashes but denies any arthralgias or myalgias.  She denies any lumps or nodules in the breast.   REVIEW OF SYSTEMS:   Constitutional: Denies fevers, chills or abnormal weight loss Eyes: Denies blurriness of vision Ears, nose, mouth, throat, and face: Denies mucositis or sore throat Respiratory: Denies cough, dyspnea or  wheezes Cardiovascular: Denies palpitation, chest discomfort Gastrointestinal:  Denies nausea, heartburn or change in bowel habits Skin: Denies abnormal skin rashes Lymphatics: Denies new lymphadenopathy or easy bruising Neurological:Denies numbness, tingling or new weaknesses Behavioral/Psych: Mood is stable, no new changes  Extremities: No lower extremity edema Breast:  denies any pain or lumps or nodules in either breasts All other systems were reviewed with the patient and are negative.  I have reviewed the past medical history, past surgical history, social history and family history with the patient and they are unchanged from previous note.  ALLERGIES:  is allergic to tape.  MEDICATIONS:  Current Outpatient Medications  Medication Sig Dispense Refill  . amphetamine-dextroamphetamine (ADDERALL XR) 30 MG 24 hr capsule Take 1 capsule (30 mg total) by mouth daily. 30 capsule 0  . CALCIUM CARBONATE PO Take 250 mg by mouth daily.     . cholecalciferol (VITAMIN D) 1000 units tablet Take 1,000 Units by mouth daily.     . cyclobenzaprine (FLEXERIL) 10 MG tablet Take 1 tablet (10 mg total) by mouth at bedtime as needed. 90 tablet 3  . GILENYA 0.5 MG CAPS TAKE 1 CAPSULE BY MOUTH EVERY DAY 90 capsule 2  . IRON PO Take 1 tablet by mouth 2 (two) times a week. occ     . letrozole (FEMARA) 2.5 MG tablet Take 1 tablet (2.5 mg total) by mouth daily. 90 tablet 3  . meloxicam (MOBIC) 15 MG tablet TAKE 1 PO EVERY DAY WITH FOOD AS NEEDED FOR PAIN 90 tablet 3  . Multiple Vitamin (MULTIVITAMIN) tablet Take 1 tablet by mouth daily.    . sertraline (  ZOLOFT) 50 MG tablet Take 1 tablet (50 mg total) by mouth daily. 90 tablet 1  . valACYclovir (VALTREX) 1000 MG tablet Take 0.5 tablets (500 mg total) by mouth daily. 30 tablet 12  . vitamin B-12 (CYANOCOBALAMIN) 1000 MCG tablet Take 1,000 mcg by mouth daily.      No current facility-administered medications for this visit.    Facility-Administered  Medications Ordered in Other Visits  Medication Dose Route Frequency Provider Last Rate Last Dose  . gadopentetate dimeglumine (MAGNEVIST) injection 20 mL  20 mL Intravenous Once PRN Sater, Richard A, MD      . gadopentetate dimeglumine (MAGNEVIST) injection 20 mL  20 mL Intravenous Once PRN Sater, Nanine Means, MD        PHYSICAL EXAMINATION: ECOG PERFORMANCE STATUS: 1 - Symptomatic but completely ambulatory  Vitals:   05/09/17 1217  BP: (!) 151/83  Pulse: 84  Resp: 17  Temp: (!) 97.4 F (36.3 C)  SpO2: 100%   Filed Weights   05/09/17 1217  Weight: 232 lb 3.2 oz (105.3 kg)    GENERAL:alert, no distress and comfortable SKIN: skin color, texture, turgor are normal, no rashes or significant lesions EYES: normal, Conjunctiva are pink and non-injected, sclera clear OROPHARYNX:no exudate, no erythema and lips, buccal mucosa, and tongue normal  NECK: supple, thyroid normal size, non-tender, without nodularity LYMPH:  no palpable lymphadenopathy in the cervical, axillary or inguinal LUNGS: clear to auscultation and percussion with normal breathing effort HEART: regular rate & rhythm and no murmurs and no lower extremity edema ABDOMEN:abdomen soft, non-tender and normal bowel sounds MUSCULOSKELETAL:no cyanosis of digits and no clubbing  NEURO: alert & oriented x 3 with fluent speech, no focal motor/sensory deficits EXTREMITIES: No lower extremity edema BREAST: No palpable masses or nodules in either right or left breasts. No palpable axillary supraclavicular or infraclavicular adenopathy no breast tenderness or nipple discharge. (exam performed in the presence of a chaperone)  LABORATORY DATA:  I have reviewed the data as listed CMP Latest Ref Rng & Units 01/06/2017 03/23/2016 02/18/2016  Glucose 65 - 99 mg/dL 103(H) 85 83  BUN 6 - 20 mg/dL 18 14 18.8  Creatinine 0.44 - 1.00 mg/dL 0.74 0.72 0.8  Sodium 135 - 145 mmol/L 139 141 143  Potassium 3.5 - 5.1 mmol/L 3.8 3.6 3.9  Chloride 101  - 111 mmol/L 107 105 -  CO2 22 - 32 mmol/L 21(L) 28 27  Calcium 8.9 - 10.3 mg/dL 9.0 9.1 9.5  Total Protein 6.5 - 8.1 g/dL - 6.3(L) 7.2  Total Bilirubin 0.3 - 1.2 mg/dL - 0.5 0.80  Alkaline Phos 38 - 126 U/L - 66 109  AST 15 - 41 U/L - 33 31  ALT 14 - 54 U/L - 31 45    Lab Results  Component Value Date   WBC 3.5 (L) 01/06/2017   HGB 13.3 02/24/2017   HCT 40.9 01/06/2017   MCV 93.8 01/06/2017   PLT 208 01/06/2017   NEUTROABS 3.3 02/18/2016    ASSESSMENT & PLAN:  Malignant neoplasm of upper-outer quadrant of right female breast ([place]) 03/25/2016: Right lumpectomy: IDC 0.4 cm with DCIS, margins negative, 0/1 lymph node negative, right mammoplasty: Benign, ER 100%, PR 100%, HER-2 negative ratio 1.77, Ki-67 2%, T1a N0 stage IA  Adjuvant radiation therapy started 06/02/2016 completed 07/14/2016  Current treatment: Adjuvant letrozole 2.5 mg daily starting 08/06/2016  Letrozole toxicities: Denies any myalgias. Complains of intermittent hot flashes.  They are tolerable.  Return to clinic in 1 year for  follow-up    No orders of the defined types were placed in this encounter.  The patient has a good understanding of the overall plan. she agrees with it. she will call with any problems that may develop before the next visit here.   Harriette Ohara, MD 05/09/17

## 2017-05-09 NOTE — Assessment & Plan Note (Signed)
03/25/2016: Right lumpectomy: IDC 0.4 cm with DCIS, margins negative, 0/1 lymph node negative, right mammoplasty: Benign, ER 100%, PR 100%, HER-2 negative ratio 1.77, Ki-67 2%, T1a N0 stage IA  Adjuvant radiation therapy started 06/02/2016 completed 07/14/2016  Current treatment: Adjuvant letrozole 2.5 mg daily starting 08/06/2016  Letrozole toxicities: Denies any myalgias. Complains of intermittent hot flashes.  They are tolerable.  Return to clinic in 1 year for follow-up

## 2017-05-09 NOTE — Telephone Encounter (Signed)
Called regarding voicemail °

## 2017-05-30 ENCOUNTER — Other Ambulatory Visit: Payer: Self-pay | Admitting: Neurology

## 2017-05-30 NOTE — Telephone Encounter (Signed)
Pt calling for refill of amphetamine-dextroamphetamine (ADDERALL XR) 30 MG 24 hr capsule

## 2017-05-31 MED ORDER — AMPHETAMINE-DEXTROAMPHET ER 30 MG PO CP24: 30.0000 mg | ORAL_CAPSULE | Freq: Every day | ORAL | 0 refills | Status: DC

## 2017-05-31 NOTE — Addendum Note (Signed)
Addended by: [place] Ravens I on: 05/31/2017 08:25 AM   Modules accepted: Orders

## 2017-06-28 ENCOUNTER — Encounter: Payer: Self-pay | Admitting: Neurology

## 2017-06-30 ENCOUNTER — Other Ambulatory Visit: Payer: Self-pay | Admitting: Neurology

## 2017-06-30 MED ORDER — AMPHETAMINE-DEXTROAMPHET ER 30 MG PO CP24: 30.0000 mg | ORAL_CAPSULE | Freq: Every day | ORAL | 0 refills | Status: DC

## 2017-06-30 NOTE — Telephone Encounter (Signed)
Pt request refill for amphetamine-dextroamphetamine (ADDERALL XR) 30 MG 24 hr capsule sent to [facility]

## 2017-07-04 ENCOUNTER — Other Ambulatory Visit: Payer: Self-pay | Admitting: Neurology

## 2017-07-18 ENCOUNTER — Encounter: Payer: Self-pay | Admitting: Neurology

## 2017-07-18 ENCOUNTER — Ambulatory Visit: Payer: BC Managed Care – PPO | Admitting: Neurology

## 2017-07-18 VITALS — BP 149/84 | HR 76 | Ht 63.25 in | Wt 224.5 lb

## 2017-07-18 DIAGNOSIS — R269 Unspecified abnormalities of gait and mobility: Secondary | ICD-10-CM

## 2017-07-18 DIAGNOSIS — R5383 Other fatigue: Secondary | ICD-10-CM | POA: Diagnosis not present

## 2017-07-18 DIAGNOSIS — G35D Multiple sclerosis, unspecified: Secondary | ICD-10-CM

## 2017-07-18 DIAGNOSIS — F988 Other specified behavioral and emotional disorders with onset usually occurring in childhood and adolescence: Secondary | ICD-10-CM | POA: Diagnosis not present

## 2017-07-18 DIAGNOSIS — F32A Depression, unspecified: Secondary | ICD-10-CM

## 2017-07-18 DIAGNOSIS — G35 Multiple sclerosis: Secondary | ICD-10-CM

## 2017-07-18 DIAGNOSIS — F329 Major depressive disorder, single episode, unspecified: Secondary | ICD-10-CM | POA: Diagnosis not present

## 2017-07-18 MED ORDER — AMPHETAMINE-DEXTROAMPHET ER 30 MG PO CP24: 30.0000 mg | ORAL_CAPSULE | Freq: Every day | ORAL | 0 refills | Status: DC

## 2017-07-18 NOTE — Progress Notes (Signed)
[facility]  PATIENT: Raven Montoya DOB: [birth year]  REFERRING CLINICIAN: Anastasia Pall HISTORY FROM: Patient REASON FOR VISIT: MS   HISTORICAL  CHIEF COMPLAINT:  Chief Complaint  Patient presents with  . Follow-up    MS follow up. Pt reports that she has a few things she would like to discuss today.     HISTORY OF PRESENT ILLNESS:  Raven Montoya is a 57 year old woman with MS.      Update 07/18/2017: She denies definite MS exacerbations.  I personally reviewed the MRI of the brain dated 01/25/2017.  It did not show any new lesions and has chronic classic MS foci in the hemispheres.  She is noting a few more issues.    She is doing worse with her walking and fell getting out of the bus chipping a bone in the right foot/ankle.   She had a second fall that was milder.    She feels she has some weakness in her legs    Denies spasticity in general but has occ. left arm spasms.   She had a recent DOT exam and vision was reduced (barely passed but did better with glasses).   She has mild bladder urgency  She feels her cognition is slightly worse.   Thinking/concentrating seems harder.  She left her keys in the mailbox.   This is frustrating.  Adderall has helped her focus.   She had insomnia and was placed on Ambien.   It works very fast and she gets at least 6 hours sleep.   The better sleep has helped fatigue. She tries to avoid heat.     Update 01/19/2017:   She has not had any definite MS exacerbation but she does feel that she is tripping more and has more numbness in her legs. She also notes that she feels more cognitively foggy. An MRI of the brain from January 2017 have been stable compared to 2015 MRI.  She notes that she stumbles frequently but she has not had any significant fall. She denies any major weakness though the legs do feel little heavier. She also notes a little bit more numbness in the legs. Left > right.   She has mildl left buttock pain also.. She  occasionally will have tingling in the arms. Bladder and bowel are doing well with just mild urgency and some nocturia now.   She does not note any MS related visual problems. She has had more fatigue and sleepiness this year. A PSG has shown snoring but no obstructive sleep apnea. Adderall has helped the fatigue is sleepiness. She has sleep maintenance insomnia, worse the past month.  She takes 1/2 gabapentin most nights.  She no longer takes Flexeril which helped her at night.   She has mild depression she denies any significant cognitive issues.          Earlier this year, she was diagnosed with right breast cancer. She has had surgery and radiation.   She also has had left sided surgery.     From 07/15/2016: Breast cancer:   She was diagnosed with stage 1A breast cancer and had radiation after lumpectomy.   She just finished [facility] .    MS:    She is on Gilenya and she tolerates it well.   She denies any exacerbation or new MS symptoms.     She tolerates it well.   Her last MRI 02/12/2015 was unchanged compared to 11/17/2013 MRI (around time she started Gilenya).  Gait/strength/sensation:    Her gait is doing well. She has no recent falls. She denies any significant weakness or numbness in the arms or legs. She will feel some tingling in the hands at times but this is not painful.    Bladder/bowel:   She denies any significant issues with these.  Vision:   She denies any MS related vision problem.   She wears reading glasses.    Fatigue/Sleep:   She notes more fatigue and sleepiness this year.    She drives a school bus.   PSG showed snoring but no OSA.   Her fatigue and sleepiness are better on  Adderall.   She has sleep maintenance insomnia but no sleep onset issues.    Trazodone and Flexeril at bedtime have not helped.     Mood:   She is noting mildly more depression and anxiety.  Her mom dies in 05/07/2022 and she was diagnosed with Breast ca in the past 6 months.  .     Cognition:    She  notes mild difficulties with attention and focus which have improvement with Adderall.  MS History:  She was diagnosed with multiple sclerosis at age 44 after presenting with optic neuritis in the left eye. Her neurologist discussed with her that she probably had MS but no medication was started at that time. Vision got better over the next few weeks. A couple years later, she went to see Dr. Effie Shy. He felt that the MS should be treated and she was started on Rebif. She remain on Rebif for many years   She had not had any major exacerbations while on Rebif but had needles fatigue.    In 2014, she switched to [place]. She tolerates Gilenya well.   REVIEW OF SYSTEMS:  Constitutional: No fevers, chills, sweats, or change in appetite.   She has fatigue and mild insomnia (helped by Ambien) Eyes: No visual changes, double vision, eye pain Ear, nose and throat: No hearing loss, ear pain, nasal congestion, sore throat Cardiovascular: No chest pain, palpitations Respiratory:  No shortness of breath at rest or with exertion.   No wheezes GastrointestinaI: No nausea, vomiting, diarrhea, abdominal pain, fecal incontinence Genitourinary:  No dysuria, urinary retention or frequency.  No nocturia. Musculoskeletal:  Mild neck pain, no major back pain Integumentary: No rash, pruritus, skin lesions Neurological: as above Psychiatric: as above.    Endocrine: No palpitations, diaphoresis, change in appetite, change in weigh or increased thirst Hematologic/Lymphatic:  No anemia, purpura, petechiae. Allergic/Immunologic: No itchy/runny eyes, nasal congestion, recent allergic reactions, rashes  ALLERGIES: Allergies  Allergen Reactions  . Tape Hives    HOME MEDICATIONS: Outpatient Medications Prior to Visit  Medication Sig Dispense Refill  . CALCIUM CARBONATE PO Take 250 mg by mouth daily.     . cholecalciferol (VITAMIN D) 1000 units tablet Take 1,000 Units by mouth daily.     . cyclobenzaprine  (FLEXERIL) 10 MG tablet Take 1 tablet (10 mg total) by mouth at bedtime as needed. 90 tablet 3  . GILENYA 0.5 MG CAPS TAKE 1 CAPSULE BY MOUTH EVERY DAY 90 capsule 2  . IRON PO Take 1 tablet by mouth 2 (two) times a week. occ     . letrozole (FEMARA) 2.5 MG tablet Take 1 tablet (2.5 mg total) by mouth daily. 90 tablet 3  . meloxicam (MOBIC) 15 MG tablet TAKE 1 PO EVERY DAY WITH FOOD AS NEEDED FOR PAIN 90 tablet 3  . Multiple Vitamin (MULTIVITAMIN) tablet  Take 1 tablet by mouth daily.    . sertraline (ZOLOFT) 50 MG tablet TAKE 1 TABLET BY MOUTH EVERY DAY 90 tablet 1  . valACYclovir (VALTREX) 1000 MG tablet Take 0.5 tablets (500 mg total) by mouth daily. 30 tablet 12  . vitamin B-12 (CYANOCOBALAMIN) 1000 MCG tablet Take 1,000 mcg by mouth daily.     Marland Kitchen zolpidem (AMBIEN) 10 MG tablet     . amphetamine-dextroamphetamine (ADDERALL XR) 30 MG 24 hr capsule Take 1 capsule (30 mg total) by mouth daily. 30 capsule 0   Facility-Administered Medications Prior to Visit  Medication Dose Route Frequency Provider Last Rate Last Dose  . gadopentetate dimeglumine (MAGNEVIST) injection 20 mL  20 mL Intravenous Once PRN Jamarques Pinedo A, MD      . gadopentetate dimeglumine (MAGNEVIST) injection 20 mL  20 mL Intravenous Once PRN Loida Calamia, Nanine Means, MD        PAST MEDICAL HISTORY: Past Medical History:  Diagnosis Date  . Anemia   . Anxiety   . Arthritis   . Cancer [facility])    right breast  . Depression   . History of radiation therapy 06/02/16-07/14/16   right breast 50.4 Gy in 28 fractions  . Memory loss   . Multiple sclerosis ([place])    age 89  . Neuropathy   . Shingles   . 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 Vision abnormalities   . Wears glasses     PAST SURGICAL HISTORY: Past Surgical History:  Procedure Laterality Date  . ADENOIDECTOMY    . BREAST LUMPECTOMY WITH RADIOACTIVE SEED AND SENTINEL LYMPH NODE BIOPSY Right 03/25/2016   Procedure: RIGHT BREAST SEED GUIDED PARTIAL MASTECTOMY  WITH RIGHT SENITINEL LYMPH NODE MAPPING;  Surgeon: Erroll Luna, MD;  Location: [place];  Service: General;  Laterality: Right;  . BREAST RECONSTRUCTION Right 03/25/2016   Procedure: RIGHT BREAST RECONSTRUCTION;  Surgeon: Loel Lofty Dillingham, DO;  Location: [place];  Service: Plastics;  Laterality: Right;  . BREAST REDUCTION SURGERY Right 03/25/2016   Procedure: MAMMARY REDUCTION  (BREAST) RIGHT;  Surgeon: Loel Lofty Dillingham, DO;  Location: [place];  Service: Plastics;  Laterality: Right;  . BREAST REDUCTION SURGERY Left 01/06/2017   Procedure: LEFT MAMMARY REDUCTION  (BREAST) FOR SYMMETRY WITH LIPOSUCTION;  Surgeon: Wallace Going, DO;  Location: [place];  Service: Plastics;  Laterality: Left;  . BUNIONECTOMY    . CESAREAN SECTION    . COLONOSCOPY W/ BIOPSIES AND POLYPECTOMY    . INCISION AND DRAINAGE OF WOUND Right 04/21/2016   Procedure: IRRIGATION AND DEBRIDEMENT RIGHT BREAST WOUND;  Surgeon: Wallace Going, DO;  Location: [place];  Service: Plastics;  Laterality: Right;  . MASS EXCISION Left 02/24/2017   Procedure: LEFT BREAST WOUND EXCISION AND CLOSURE WITH ACELL APPLICATION;  Surgeon: Wallace Going, DO;  Location: [place];  Service: Plastics;  Laterality: Left;  . TONSILLECTOMY      FAMILY HISTORY: Family History  Problem Relation Age of Onset  . Osteoporosis Mother   . Heart disease Mother   . Stroke Mother   . COPD Mother   . Cancer Father        colon  . Heart disease Brother   . Heart attack Brother     SOCIAL HISTORY:  Social History   Socioeconomic History  . Marital status: Divorced    Spouse name: Not on file  . Number of children: 1  . Years  of education: Not on file  . Highest education level: Not on file  Occupational History  . Occupation: Designer, fashion/clothing  . Financial resource strain: Not on file  . Food insecurity:    Worry: Not on file    Inability: Not on file  .  Transportation needs:    Medical: Not on file    Non-medical: Not on file  Tobacco Use  . Smoking status: Never Smoker  . Smokeless tobacco: Never Used  Substance and Sexual Activity  . Alcohol use: Yes    Comment: rare  . Drug use: No  . Sexual activity: Yes    Partners: Male    Birth control/protection: Condom    Comment: partner has had vasectomy  Lifestyle  . Physical activity:    Days per week: Not on file    Minutes per session: Not on file  . Stress: Not on file  Relationships  . Social connections:    Talks on phone: Not on file    Gets together: Not on file    Attends religious service: Not on file    Active member of club or organization: Not on file    Attends meetings of clubs or organizations: Not on file    Relationship status: Not on file  . Intimate partner violence:    Fear of current or ex partner: Not on file    Emotionally abused: Not on file    Physically abused: Not on file    Forced sexual activity: Not on file  Other Topics Concern  . Not on file  Social History Narrative  . Not on file     PHYSICAL EXAM  Vitals:   07/18/17 1247  BP: (!) 149/84  Pulse: 76  Weight: 224 lb 8 oz (101.8 kg)  Height: 5' 3.25" (1.607 m)    Body mass index is 39.45 kg/m.   General: The patient is well-developed and well-nourished and in no acute distress  Musculoskeletal:   She has mild tenderness at the occiput the upper paraspinal muscles. There is moderate tenderness over the left piriformis muscle and milder tenderness over the left trochanteric bursa  Neurologic Exam  Mental status: The patient is alert and oriented x 3 at the time of the examination. The patient has apparent normal recent and remote memory, with an apparently normal attention span and concentration ability.   Speech is normal.  Cranial nerves: Extraocular movements are full.  Facial strength and sensation is normal.  Trapezius strength is normal. The tongue is midline, and the  patient has symmetric elevation of the soft palate. No obvious hearing deficits are noted.  Motor:  Muscle bulk and tone are normal. Strength is  5 / 5 in all 4 extremities.   Sensory: She has intact sensation to touch and vibration in the arms and legs  Coordination: Cerebellar testing reveals good finger-nose-finger bilaterally.  Gait and station: Station and gait are normal.  The tandem gait is mildly wide.  The Romberg is negative. Reflexes: Deep tendon reflexes are symmetric and brisk bilaterally. Marland Kitchen    DIAGNOSTIC DATA (LABS, IMAGING, TESTING) - I reviewed patient records, labs, notes, testing and imaging myself where available.  Lab Results  Component Value Date   WBC 3.5 (L) 01/06/2017   HGB 13.3 02/24/2017   HCT 40.9 01/06/2017   MCV 93.8 01/06/2017   PLT 208 01/06/2017      Component Value Date/Time   NA 139 01/06/2017 0700   NA 143 02/18/2016  1247   K 3.8 01/06/2017 0700   K 3.9 02/18/2016 1247   CL 107 01/06/2017 0700   CO2 21 (L) 01/06/2017 0700   CO2 27 02/18/2016 1247   GLUCOSE 103 (H) 01/06/2017 0700   GLUCOSE 83 02/18/2016 1247   BUN 18 01/06/2017 0700   BUN 18.8 02/18/2016 1247   CREATININE 0.74 01/06/2017 0700   CREATININE 0.8 02/18/2016 1247   CALCIUM 9.0 01/06/2017 0700   CALCIUM 9.5 02/18/2016 1247   PROT 6.3 (L) 03/23/2016 1503   PROT 7.2 02/18/2016 1247   ALBUMIN 4.1 03/23/2016 1503   ALBUMIN 4.2 02/18/2016 1247   AST 33 03/23/2016 1503   AST 31 02/18/2016 1247   ALT 31 03/23/2016 1503   ALT 45 02/18/2016 1247   ALKPHOS 66 03/23/2016 1503   ALKPHOS 109 02/18/2016 1247   BILITOT 0.5 03/23/2016 1503   BILITOT 0.80 02/18/2016 1247   GFRNONAA >60 01/06/2017 0700   GFRAA >60 01/06/2017 0700   Lab Results  Component Value Date   CHOL 161 10/17/2012   HDL 48 10/17/2012   LDLCALC 66 10/17/2012   TRIG 236 (H) 10/17/2012   CHOLHDL 3.4 10/17/2012   Lab Results  Component Value Date   HGBA1C 5.4 10/17/2012   No results found for:  VITAMINB12 Lab Results  Component Value Date   TSH 1.71 04/01/2015      ASSESSMENT AND PLAN Multiple sclerosis (HCC)  Depression, unspecified depression type  Other fatigue  Attention deficit disorder, unspecified hyperactivity presence  Gait disturbance   1.  Continue Gilenya.  She has had recent blood work at [facility].  She will send [place] the results. 2.  Continue Adderall for her Fatigue, sleepiness and attention  3   exercise and stay active 4.  She will come back to see [place] in about 5-6 months or call sooner if she has new or worsening neurologic symptoms.   [place] Hijazi A. Felecia Shelling, MD, PhD 9/76/7341, 9:37 PM Certified in Neurology, Clinical Neurophysiology, Sleep Medicine, Pain Medicine and Neuroimaging  [facility] [address] [place], [place] [address] [phone]

## 2017-07-21 IMAGING — CT CT ABD-PELV W/ CM
2 of 4 series · 16 of 46 positions shown, 18 images · IV contrast (ISOVUE)
Comparison: 08/30/2005

CLINICAL DATA: Elevated lipase, patient complains of right lower
quadrant pain

EXAM:
CT ABDOMEN AND PELVIS WITH CONTRAST
TECHNIQUE: Multidetector CT imaging of the abdomen and pelvis was performed
using the standard protocol following bolus administration of
intravenous contrast.
CONTRAST:  100mL [id number] IOPAMIDOL ([id number]) INJECTION 61%

[Series 2: abd/pel with · axial · 0.84mm/px · z∈[+1108,+1544]mm · 13 of 97 slices shown, 15 images]
[im 5/97  soft-tissue]
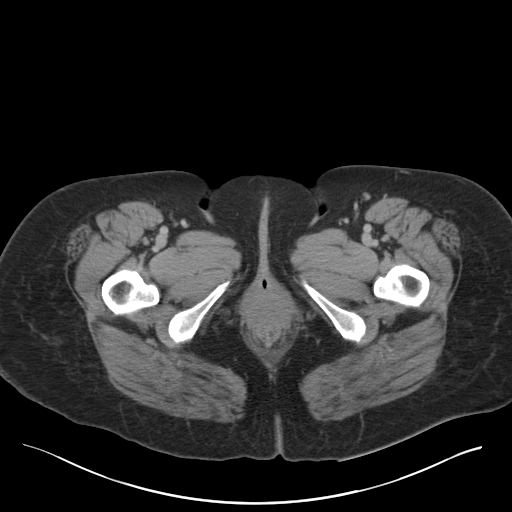
[im 5/97  bone]
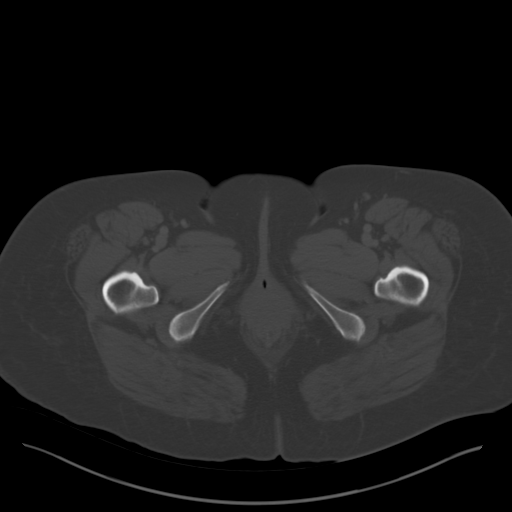
[im 15/97  soft-tissue]
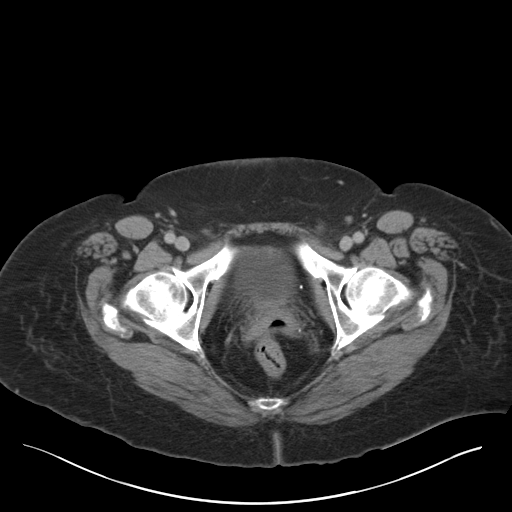
[im 20/97  soft-tissue]
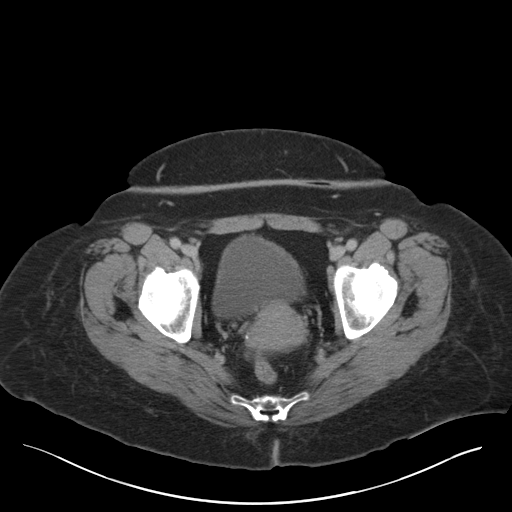
[im 29/97  soft-tissue]
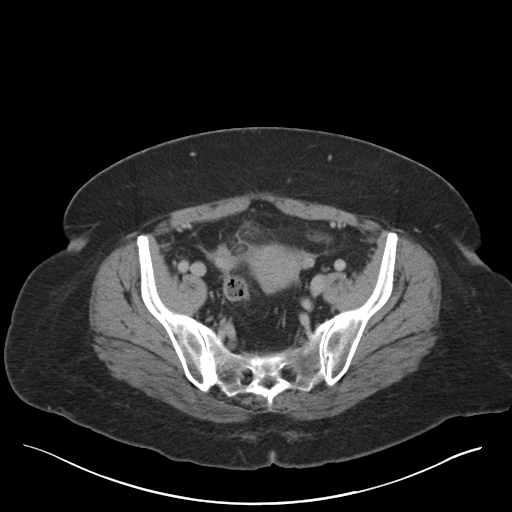
[im 34/97  soft-tissue]
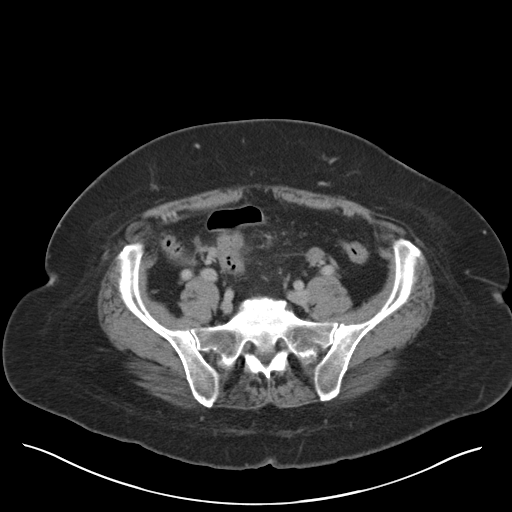
[im 44/97  soft-tissue]
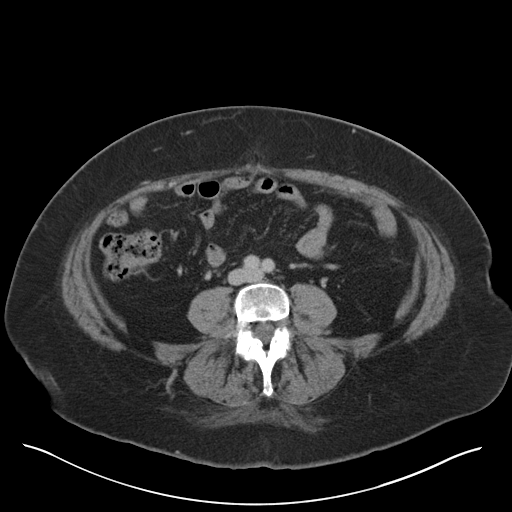
[im 49/97  soft-tissue]
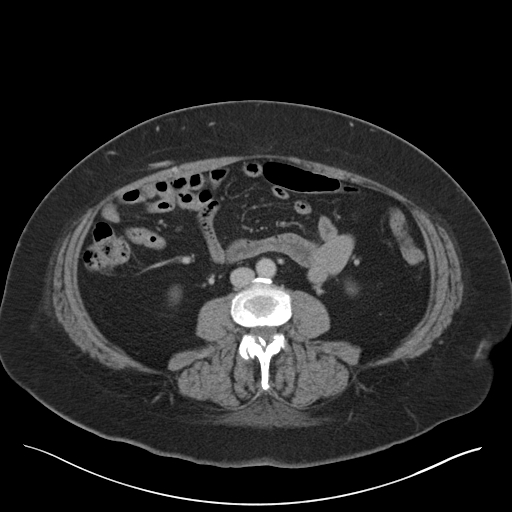
[im 53/97  soft-tissue]
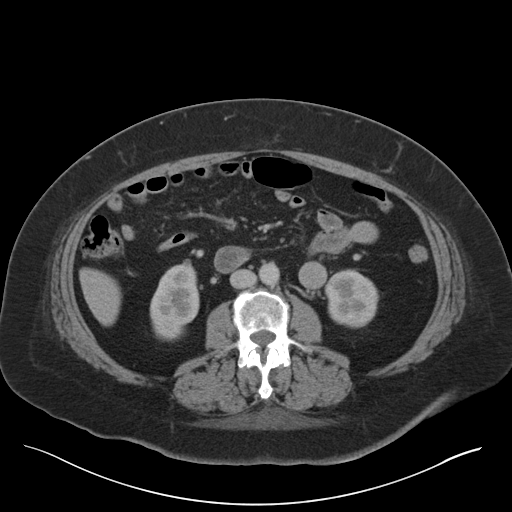
[im 63/97  soft-tissue]
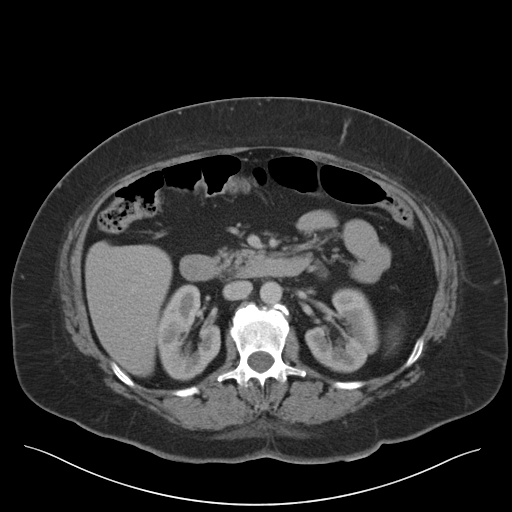
[im 63/97  bone]
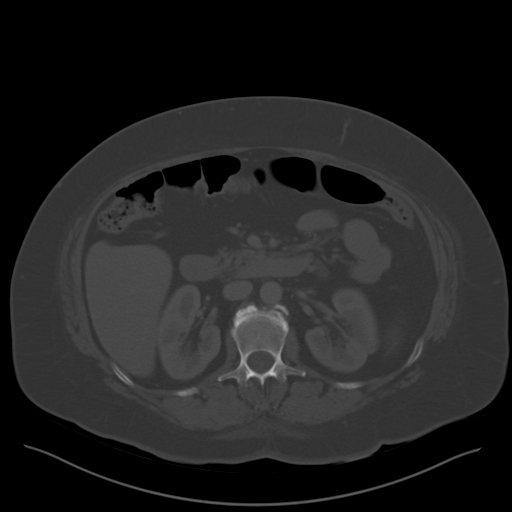
[im 68/97  soft-tissue]
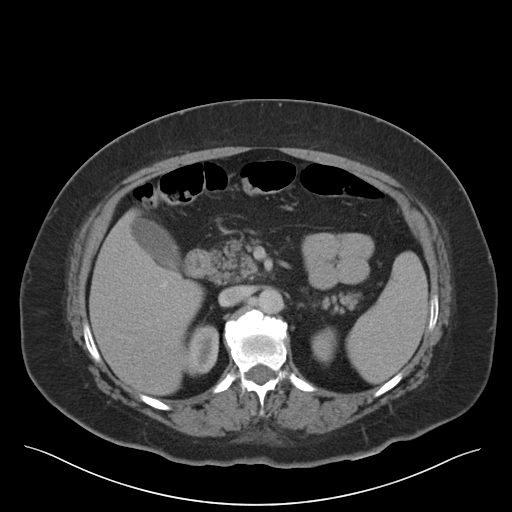
[im 77/97  soft-tissue]
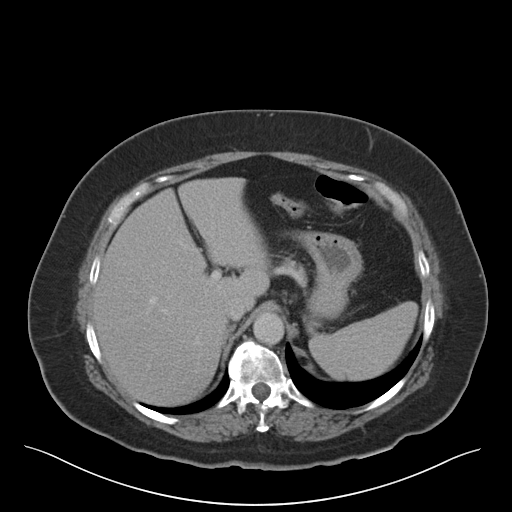
[im 82/97  soft-tissue]
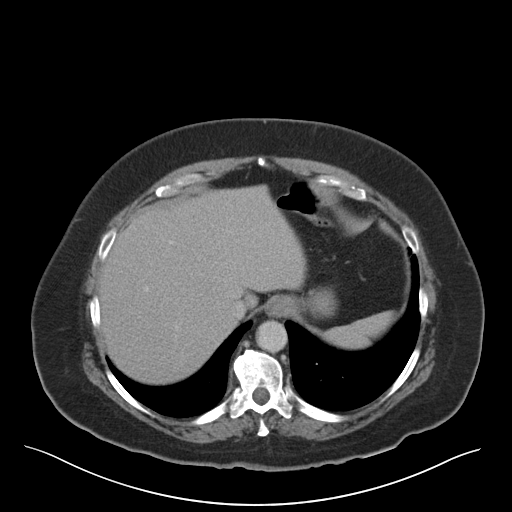
[im 92/97  soft-tissue]
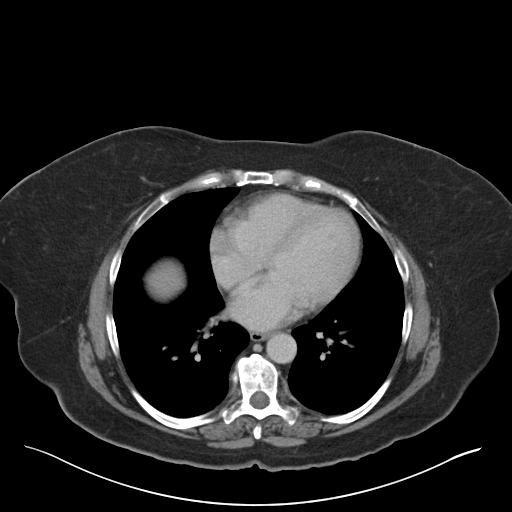

[Series 3: coronal a/|p · coronal · 0.96mm/px · 3 of 169 slices shown]
[im 57/169  soft-tissue]
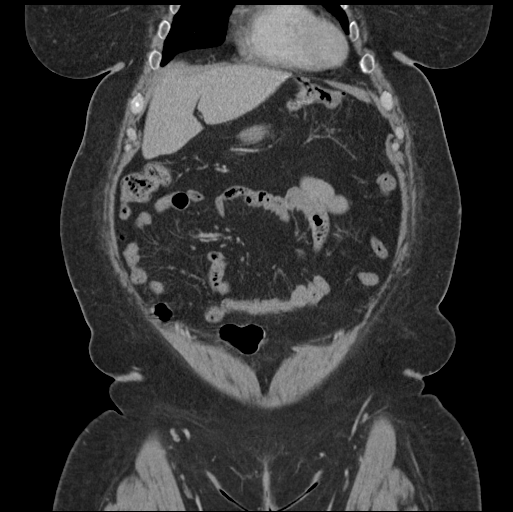
[im 75/169  soft-tissue]
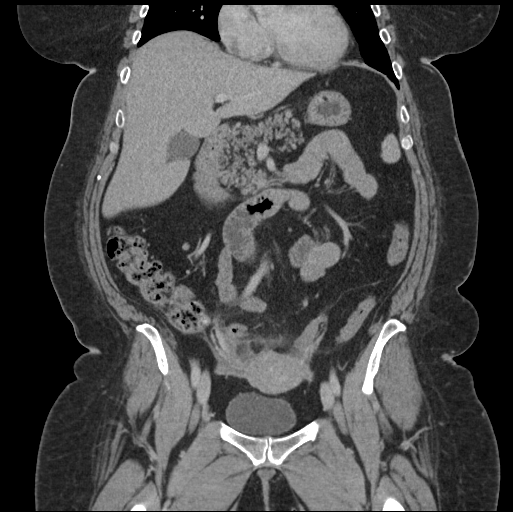
[im 94/169  soft-tissue]
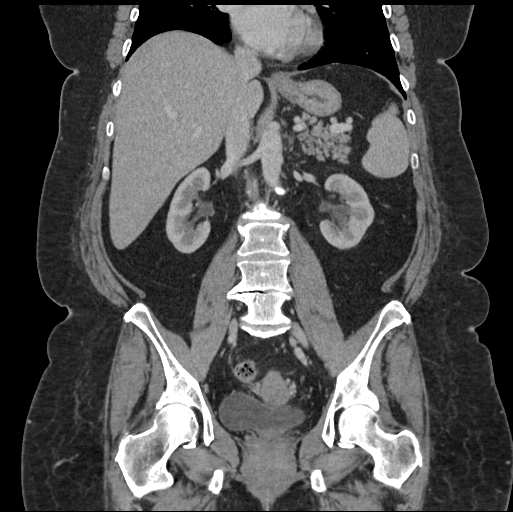

[16 of 46 positions shown; findings below may reference images not displayed]

FINDINGS: Lower chest: Visualized lung bases demonstrate no acute infiltrate
or effusion.

Hepatobiliary: The liver appears slightly enlarged measuring 20 cm
in craniocaudad dimension. No focal hepatic abnormality is seen. No
calcified gallstones. No biliary dilatation.

Pancreas: Unremarkable. No pancreatic ductal dilatation or
surrounding inflammatory changes.

Spleen: Normal in size without focal abnormality.

Adrenals/Urinary Tract: Adrenal glands are within normal limits.
Tiny exophytic hypodensity lower right kidney, too small to further
characterize. Possible 5 mm angiomyolipoma lower pole left kidney,
best seen on delayed views. Urinary bladder is grossly unremarkable.

Stomach/Bowel: Stomach is nonenlarged. No dilated small bowel. The
appendix is visualized and is normal.

There is edema and soft tissue stranding adjacent to the sigmoid
colon, this is associated with achy oval shaped fat density signal,
the findings would be consistent with acute epiploic appendagitis.

Vascular/Lymphatic: No significant vascular findings are present. No
enlarged abdominal or pelvic lymph nodes.

Reproductive: Uterus and bilateral adnexa are unremarkable.

Other: No free air.  No free fluid.

Musculoskeletal: Multilevel degenerative changes. Mild grade 1
anterior listhesis of L4 on L5 with chronic appearing pars defect.
IMPRESSION: 1. Edema and fat stranding adjacent to the sigmoid colon with
imaging features suspicious for epiploic appendagitis.
2. No CT evidence for acute appendicitis.
3. Enlarged liver
4. Possible 5 mm angiomyolipoma in the lower left kidney. Sub cm
exophytic hypodensity anterior lower pole right kidney, too small to
further characterize.

## 2017-07-26 ENCOUNTER — Encounter: Payer: Self-pay | Admitting: Neurology

## 2017-08-02 ENCOUNTER — Encounter (HOSPITAL_COMMUNITY): Payer: Self-pay

## 2017-08-02 ENCOUNTER — Emergency Department (HOSPITAL_COMMUNITY)
Admission: EM | Admit: 2017-08-02 | Discharge: 2017-08-02 | Disposition: A | Discharge status: Home / Self Care | Payer: BC Managed Care – PPO | Attending: Emergency Medicine | Admitting: Emergency Medicine

## 2017-08-02 DIAGNOSIS — K2900 Acute gastritis without bleeding: Secondary | ICD-10-CM | POA: Diagnosis not present

## 2017-08-02 DIAGNOSIS — Z853 Personal history of malignant neoplasm of breast: Secondary | ICD-10-CM | POA: Diagnosis not present

## 2017-08-02 DIAGNOSIS — R1013 Epigastric pain: Secondary | ICD-10-CM | POA: Diagnosis present

## 2017-08-02 DIAGNOSIS — Z79899 Other long term (current) drug therapy: Secondary | ICD-10-CM | POA: Insufficient documentation

## 2017-08-02 LAB — COMPREHENSIVE METABOLIC PANEL
ALBUMIN: 4.4 g/dL (ref 3.5–5.0)
ALK PHOS: 104 U/L (ref 38–126)
ALT: 40 U/L (ref 0–44)
ANION GAP: 11 (ref 5–15)
AST: 28 U/L (ref 15–41)
BILIRUBIN TOTAL: 1.1 mg/dL (ref 0.3–1.2)
BUN: 17 mg/dL (ref 6–20)
CALCIUM: 9.6 mg/dL (ref 8.9–10.3)
CO2: 26 mmol/L (ref 22–32)
Chloride: 104 mmol/L (ref 98–111)
Creatinine, Ser: 0.91 mg/dL (ref 0.44–1.00)
GFR calc Af Amer: >60 mL/min (ref >60)
GFR calc non Af Amer: >60 mL/min (ref >60)
GLUCOSE: 101 mg/dL — AB (ref 70–99)
POTASSIUM: 3.5 mmol/L (ref 3.5–5.1)
SODIUM: 141 mmol/L (ref 135–145)
TOTAL PROTEIN: 7.2 g/dL (ref 6.5–8.1)

## 2017-08-02 LAB — CBC
HCT: 42 % (ref 36.0–46.0)
HEMOGLOBIN: 13.9 g/dL (ref 12.0–15.0)
MCH: 30.7 pg (ref 26.0–34.0)
MCHC: 33.1 g/dL (ref 30.0–36.0)
MCV: 92.7 fL (ref 78.0–100.0)
Platelets: 292 10*3/uL (ref 150–400)
RBC: 4.53 MIL/uL (ref 3.87–5.11)
RDW: 13.3 % (ref 11.5–15.5)
WBC: 4.9 10*3/uL (ref 4.0–10.5)

## 2017-08-02 LAB — LIPASE, BLOOD: Lipase: 34 U/L (ref 11–51)

## 2017-08-02 LAB — URINALYSIS, ROUTINE W REFLEX MICROSCOPIC
Bilirubin Urine: NEGATIVE
GLUCOSE, UA: NEGATIVE mg/dL
KETONES UR: NEGATIVE mg/dL
Leukocytes, UA: NEGATIVE
NITRITE: NEGATIVE
PROTEIN: 30 mg/dL — AB
Specific Gravity, Urine: 1.021 (ref 1.005–1.030)
pH: 5 (ref 5.0–8.0)

## 2017-08-02 MED ORDER — OMEPRAZOLE 20 MG PO CPDR: 20.0000 mg | DELAYED_RELEASE_CAPSULE | Freq: Every day | ORAL | 0 refills | Status: DC

## 2017-08-02 MED ORDER — GI COCKTAIL ~~LOC~~
30.0000 mL | Freq: Once | ORAL | Status: AC
  Administered 2017-08-02: 30 mL via ORAL
  Filled 2017-08-02: qty 30

## 2017-08-02 MED ORDER — RANITIDINE HCL 150 MG PO CAPS: 150.0000 mg | ORAL_CAPSULE | Freq: Every day | ORAL | 0 refills | Status: DC

## 2017-08-02 NOTE — ED Triage Notes (Signed)
Pt presents with epigastric pain x 2-3 days; denies any nausea or vomiting or diarrhea.  Pt denies any dysuria.  Pt reports pain radiates to L upper quadrant but now is mainly epigastric.

## 2017-08-02 NOTE — ED Provider Notes (Signed)
[facility] EMERGENCY DEPARTMENT Provider Note   CSN: [id number] Arrival date & time: 08/02/17  1653     History   Chief Complaint Chief Complaint  Patient presents with  . Abdominal Pain    HPI Raven Montoya is a 57 y.o. female.  HPI   57 year old female presents today with complaints of abdominal pain.  Patient is 4-day history of epigastric abdominal pain.  She notes symptoms coming going, she notes they are more severe after eating or drinking.  She denies any radiation of symptoms denies any nausea vomiting or fever.  She denies any chest pain or shortness of breath, lower abdominal pain.  Patient notes she is under significant stress at this present time.  She notes she saw her primary care provider and was given prednisone for unclear etiology.   Past Medical History:  Diagnosis Date  . Anemia   . Anxiety   . Arthritis   . Cancer [facility])    right breast  . Depression   . History of radiation therapy 06/02/16-07/14/16   right breast 50.4 Gy in 28 fractions  . Memory loss   . Multiple sclerosis ([place])    age 56  . Neuropathy   . Shingles   . 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 Vision abnormalities   . Wears glasses     Patient Active Problem List   Diagnosis Date Noted  . Personal history of radiation therapy 02/18/2017  . Status post right breast reconstruction 02/18/2017  . Status post breast reduction 04/16/2016  . Breast cancer ([place]) 03/25/2016  . Postoperative breast asymmetry 02/24/2016  . Symptomatic mammary hypertrophy 02/24/2016  . Malignant neoplasm of upper-outer quadrant of right female breast ([place]) 02/17/2016  . Obesity, Class II, BMI 35-39.9, with comorbidity 11/25/2015  . Neck pain 10/15/2015  . Other fatigue 01/08/2015  . Attention deficit disorder 01/08/2015  . Depression with anxiety 01/08/2015  . Gait disturbance 01/08/2015  . Depression 09/19/2012  . DS (disseminated sclerosis) ([place]) 03/20/2012  .  Multiple sclerosis ([place]) 03/20/2012  . Arthritis 12/14/2011    Past Surgical History:  Procedure Laterality Date  . ADENOIDECTOMY    . BREAST LUMPECTOMY WITH RADIOACTIVE SEED AND SENTINEL LYMPH NODE BIOPSY Right 03/25/2016   Procedure: RIGHT BREAST SEED GUIDED PARTIAL MASTECTOMY WITH RIGHT SENITINEL LYMPH NODE MAPPING;  Surgeon: Erroll Luna, MD;  Location: [place];  Service: General;  Laterality: Right;  . BREAST RECONSTRUCTION Right 03/25/2016   Procedure: RIGHT BREAST RECONSTRUCTION;  Surgeon: Loel Lofty Dillingham, DO;  Location: [place];  Service: Plastics;  Laterality: Right;  . BREAST REDUCTION SURGERY Right 03/25/2016   Procedure: MAMMARY REDUCTION  (BREAST) RIGHT;  Surgeon: Loel Lofty Dillingham, DO;  Location: [place];  Service: Plastics;  Laterality: Right;  . BREAST REDUCTION SURGERY Left 01/06/2017   Procedure: LEFT MAMMARY REDUCTION  (BREAST) FOR SYMMETRY WITH LIPOSUCTION;  Surgeon: Wallace Going, DO;  Location: [facility];  Service: Plastics;  Laterality: Left;  . BUNIONECTOMY    . CESAREAN SECTION    . COLONOSCOPY W/ BIOPSIES AND POLYPECTOMY    . INCISION AND DRAINAGE OF WOUND Right 04/21/2016   Procedure: IRRIGATION AND DEBRIDEMENT RIGHT BREAST WOUND;  Surgeon: Wallace Going, DO;  Location: [facility];  Service: Plastics;  Laterality: Right;  . MASS EXCISION Left 02/24/2017   Procedure: LEFT BREAST WOUND EXCISION AND CLOSURE WITH ACELL APPLICATION;  Surgeon: Wallace Going, DO;  Location: [facility];  Service: Plastics;  Laterality: Left;  . TONSILLECTOMY       OB History    Gravida  1   Para  1   Term  1   Preterm      AB      Living  1     SAB      TAB      Ectopic      Multiple      Live Births  1            Home Medications    Prior to Admission medications   Medication Sig Start Date End Date Taking? Authorizing Provider  amphetamine-dextroamphetamine (ADDERALL XR) 30 MG 24 hr capsule  Take 1 capsule (30 mg total) by mouth daily. 07/18/17   Sater, Nanine Means, MD  CALCIUM CARBONATE PO Take 250 mg by mouth daily.     [provider]  cholecalciferol (VITAMIN D) 1000 units tablet Take 1,000 Units by mouth daily.     [provider]  cyclobenzaprine (FLEXERIL) 10 MG tablet Take 1 tablet (10 mg total) by mouth at bedtime as needed. 01/19/17   Sater, Nanine Means, MD  GILENYA 0.5 MG CAPS TAKE 1 CAPSULE BY MOUTH EVERY DAY 03/04/17   Sater, Nanine Means, MD  IRON PO Take 1 tablet by mouth 2 (two) times a week. occ     [provider]  letrozole (FEMARA) 2.5 MG tablet Take 1 tablet (2.5 mg total) by mouth daily. 05/09/17   [name], MD  meloxicam (MOBIC) 15 MG tablet TAKE 1 PO EVERY DAY WITH FOOD AS NEEDED FOR PAIN 01/19/17   Sater, Nanine Means, MD  Multiple Vitamin (MULTIVITAMIN) tablet Take 1 tablet by mouth daily.    [provider]  omeprazole (PRILOSEC) 20 MG capsule Take 1 capsule (20 mg total) by mouth daily. 08/02/17   Ballard Budney, Dellis Filbert, PA-C  ranitidine (ZANTAC) 150 MG capsule Take 1 capsule (150 mg total) by mouth daily. 08/02/17   Joanna Hall, Dellis Filbert, PA-C  sertraline (ZOLOFT) 50 MG tablet TAKE 1 TABLET BY MOUTH EVERY DAY 07/05/17   Sater, Nanine Means, MD  valACYclovir (VALTREX) 1000 MG tablet Take 0.5 tablets (500 mg total) by mouth daily. 04/13/17   Regina Eck, CNM  vitamin B-12 (CYANOCOBALAMIN) 1000 MCG tablet Take 1,000 mcg by mouth daily.     [provider]  zolpidem (AMBIEN) 10 MG tablet  06/22/17   [provider]    Family History Family History  Problem Relation Age of Onset  . Osteoporosis Mother   . Heart disease Mother   . Stroke Mother   . COPD Mother   . Cancer Father        colon  . Heart disease Brother   . Heart attack Brother     Social History Social History   Tobacco Use  . Smoking status: Never Smoker  . Smokeless tobacco: Never Used  Substance Use Topics  . Alcohol use: Yes    Comment: rare  .  Drug use: No     Allergies   Tape   Review of Systems Review of Systems  All other systems reviewed and are negative.   Physical Exam Updated Vital Signs BP (!) 155/89 (BP Location: Left Arm)   Pulse 75   Temp 98.1 F (36.7 C) (Oral)   Resp 16   LMP 12/30/2014   SpO2 98%   Physical Exam  Constitutional: She is oriented to person, place, and time. She  appears well-developed and well-nourished.  HENT:  Head: Normocephalic and atraumatic.  Eyes: Pupils are equal, round, and reactive to light. Conjunctivae are normal. Right eye exhibits no discharge. Left eye exhibits no discharge. No scleral icterus.  Neck: Normal range of motion. No JVD present. No tracheal deviation present.  Cardiovascular: Normal rate, regular rhythm, normal heart sounds and intact distal pulses. Exam reveals no gallop and no friction rub.  No murmur heard. Pulmonary/Chest: Effort normal and breath sounds normal. No stridor. No respiratory distress. She has no wheezes. She has no rales. She exhibits no tenderness.  Abdominal: Soft.  Minor epigastric tenderness to palpation, no red per quadrant, remainder of abdomen soft nontender  Neurological: She is alert and oriented to person, place, and time. Coordination normal.  Psychiatric: She has a normal mood and affect. Her behavior is normal. Judgment and thought content normal.  Nursing note and vitals reviewed.    ED Treatments / Results  Labs (all labs ordered are listed, but only abnormal results are displayed) Labs Reviewed  COMPREHENSIVE METABOLIC PANEL - Abnormal; Notable for the following components:      Result Value   Glucose, Bld 101 (*)    All other components within normal limits  URINALYSIS, ROUTINE W REFLEX MICROSCOPIC - Abnormal; Notable for the following components:   APPearance HAZY (*)    Hgb urine dipstick MODERATE (*)    Protein, ur 30 (*)    Bacteria, UA RARE (*)    All other components within normal limits  LIPASE, BLOOD  CBC     EKG None  Radiology No results found.  Procedures Procedures (including critical care time)  Medications Ordered in ED Medications  gi cocktail (Maalox,Lidocaine,Donnatal) (30 mLs Oral Given 08/02/17 1846)     Initial Impression / Assessment and Plan / ED Course  I have reviewed the triage vital signs and the nursing notes.  Pertinent labs & imaging results that were available during my care of the patient were reviewed by me and considered in my medical decision making (see chart for details).     Labs: Urinalysis, lipase, CMP, CBC  Imaging:  Consults:  Therapeutics: GI cocktail  Discharge Meds:   Assessment/Plan: 57 year old female presents today with likely gastritis.  She is well-appearing in no acute distress.  She had improvement in symptoms with GI cocktail here.  She has no right upper quadrant tenderness, no signs of obstructive pathology on her labs or infectious etiology.  Low suspicion for cholelithiasis, cholecystitis, or any other significant intra-abdominal pathology.  Patient is having no chest pain, symptoms reproduced with eating certain foods.  Patient will be started on the listed medications, outpatient follow-up with her primary care within the next week or immediately to the emergency room if she develops any new or worsening signs or symptoms.  Patient verbalized understanding and agreement to today's plan had no further questions or concerns the time discharge.      Final Clinical Impressions(s) / ED Diagnoses   Final diagnoses:  Acute gastritis without hemorrhage, unspecified gastritis type    ED Discharge Orders        Ordered    ranitidine (ZANTAC) 150 MG capsule  Daily     08/02/17 1935    omeprazole (PRILOSEC) 20 MG capsule  Daily     08/02/17 1935       Francee Gentile 08/02/17 2130    Virgel Manifold, MD 08/03/17 1252

## 2017-08-02 NOTE — ED Notes (Signed)
Pt verbalizes understanding of d/c instructions. Pt received prescriptions. Pt ambulatory at d/c with all belongings.  

## 2017-08-02 NOTE — ED Provider Notes (Signed)
Patient placed in [facility] pathway, seen and evaluated   Chief Complaint: abdominal pain  HPI:   Patient reports epigastric abdominal pain for the past 2-3 days.  He has been online and googling symptoms and thinks that it may be her gall bladder.  She reports belching.  She went to her PCP and they told her it was the wire in her bra causing her symptoms.  She has had decreased PO intake as eating makes her pain worse.    ROS: no diarrhea  Physical Exam:   Gen: No distress  Neuro: Awake and Alert  Skin: Warm    Focused Exam: Abdomen is soft, TTP in epigastric.    Initiation of care has begun. The patient has been counseled on the process, plan, and necessity for staying for the completion/evaluation, and the remainder of the medical screening examination    Ollen Gross 08/02/17 1707    Hayden Rasmussen, MD 08/03/17 1122

## 2017-08-02 NOTE — Discharge Instructions (Signed)
Please read attached information. If you experience any new or worsening signs or symptoms please return to the emergency room for evaluation. Please follow-up with your primary care provider or specialist as discussed. Please use medication prescribed only as directed and discontinue taking if you have any concerning signs or symptoms.   °

## 2017-08-08 ENCOUNTER — Telehealth: Payer: Self-pay | Admitting: Neurology

## 2017-08-08 NOTE — Telephone Encounter (Signed)
Spoke with Raven Montoya and explained Adderall was sent to [facility] on 6/22--should be available for her to pick up/fim

## 2017-08-08 NOTE — Telephone Encounter (Signed)
Pt has called for a refill on her amphetamine-dextroamphetamine (ADDERALL XR) 30 MG 24 hr capsule Please send to  [facility], [place] [phone] (Phone) [phone] (Fax)

## 2017-09-14 ENCOUNTER — Other Ambulatory Visit: Payer: Self-pay | Admitting: Neurology

## 2017-09-14 MED ORDER — AMPHETAMINE-DEXTROAMPHET ER 30 MG PO CP24: 30.0000 mg | ORAL_CAPSULE | Freq: Every day | ORAL | 0 refills | Status: DC

## 2017-09-14 NOTE — Telephone Encounter (Signed)
Pt request refill for amphetamine-dextroamphetamine (ADDERALL XR) 30 MG 24 hr capsule

## 2017-10-05 ENCOUNTER — Encounter: Payer: Self-pay | Admitting: Neurology

## 2017-10-19 ENCOUNTER — Other Ambulatory Visit: Payer: Self-pay | Admitting: Neurology

## 2017-10-19 MED ORDER — AMPHETAMINE-DEXTROAMPHET ER 30 MG PO CP24: 30.0000 mg | ORAL_CAPSULE | Freq: Every day | ORAL | 0 refills | Status: DC

## 2017-10-19 NOTE — Addendum Note (Signed)
Addended by: Hope Pigeon on: 10/19/2017 02:12 PM   Modules accepted: Orders

## 2017-10-19 NOTE — Telephone Encounter (Signed)
Patient requesting refill of amphetamine-dextroamphetamine (ADDERALL XR) 30 MG 24 hr capsule sent to [facility] in [place]. I advised Dr. Felecia Shelling is out of the office and will send to his nurse.

## 2017-11-03 ENCOUNTER — Telehealth: Payer: Self-pay | Admitting: Neurology

## 2017-11-03 NOTE — Telephone Encounter (Signed)
Spoke with [facility] and explained that [facility] is not able to rx. pain meds for orthopaedic problems that she is currently seeing orthopaedics for, as this would be a conflict in treatment. This is best handled by ortho. She verbalized understanding of same, is agreeable, and will contact ortho/fim

## 2017-11-03 NOTE — Telephone Encounter (Signed)
Pt returning RNs call- mychart message.

## 2017-11-03 NOTE — Telephone Encounter (Signed)
LMTC./fim 

## 2017-11-23 ENCOUNTER — Other Ambulatory Visit: Payer: Self-pay | Admitting: Neurology

## 2017-11-23 MED ORDER — AMPHETAMINE-DEXTROAMPHET ER 30 MG PO CP24: 30.0000 mg | ORAL_CAPSULE | Freq: Every day | ORAL | 0 refills | Status: DC

## 2017-11-23 NOTE — Telephone Encounter (Signed)
Pt requesting refills for amphetamine-dextroamphetamine (ADDERALL XR) 30 MG 24 hr capsule sent to [facility]

## 2017-12-19 ENCOUNTER — Ambulatory Visit: Payer: BC Managed Care – PPO | Admitting: Neurology

## 2017-12-28 ENCOUNTER — Other Ambulatory Visit: Payer: Self-pay | Admitting: Neurology

## 2017-12-28 MED ORDER — AMPHETAMINE-DEXTROAMPHET ER 30 MG PO CP24: 30.0000 mg | ORAL_CAPSULE | Freq: Every day | ORAL | 0 refills | Status: DC

## 2017-12-28 NOTE — Telephone Encounter (Signed)
Pt requesting a refills for   amphetamine-dextroamphetamine (ADDERALL XR) 30 MG 24 hr capsule  Sent to [facility]

## 2017-12-28 NOTE — Telephone Encounter (Signed)
No issues noted in [place] narcotic registry.  Refill is due.  Patient is up-to-date on her appts.

## 2018-01-02 ENCOUNTER — Other Ambulatory Visit: Payer: Self-pay | Admitting: *Deleted

## 2018-01-02 DIAGNOSIS — G35 Multiple sclerosis: Secondary | ICD-10-CM

## 2018-01-02 DIAGNOSIS — R269 Unspecified abnormalities of gait and mobility: Secondary | ICD-10-CM

## 2018-01-03 ENCOUNTER — Telehealth: Payer: Self-pay | Admitting: Neurology

## 2018-01-03 NOTE — Telephone Encounter (Signed)
MRI approved through [facility] authorization #[id number] (01/03/18-02/01/18). DW

## 2018-01-09 NOTE — Telephone Encounter (Signed)
I spoke to the patient and she is scheduled for 01/11/18 at [facility].

## 2018-01-11 ENCOUNTER — Ambulatory Visit (INDEPENDENT_AMBULATORY_CARE_PROVIDER_SITE_OTHER): Payer: BC Managed Care – PPO

## 2018-01-11 DIAGNOSIS — G35 Multiple sclerosis: Secondary | ICD-10-CM

## 2018-01-11 DIAGNOSIS — R269 Unspecified abnormalities of gait and mobility: Secondary | ICD-10-CM

## 2018-01-11 MED ORDER — GADOBENATE DIMEGLUMINE 529 MG/ML IV SOLN
20.0000 mL | Freq: Once | INTRAVENOUS | Status: AC | PRN
  Administered 2018-01-11: 20 mL via INTRAVENOUS

## 2018-01-17 ENCOUNTER — Telehealth: Payer: Self-pay | Admitting: *Deleted

## 2018-01-17 NOTE — Telephone Encounter (Signed)
LMOM with below MRI results. She does not need to return this call unless she has questions/fim

## 2018-01-17 NOTE — Telephone Encounter (Signed)
-----   Message from Britt Bottom, MD sent at 01/16/2018  5:49 PM EST ----- Please let her know that the MRI of the brain shows no new MS lesions.  The MRI of the cervical spine does not show any definite MS lesions.   He does have a fair amount of degenerative changes.  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  She has spinal stenosis and a couple levels in her neck but no spinal cord compression or nerve root compression.

## 2018-01-23 ENCOUNTER — Encounter: Payer: Self-pay | Admitting: Hematology and Oncology

## 2018-01-25 HISTORY — PX: BREAST SURGERY: SHX581

## 2018-02-02 ENCOUNTER — Ambulatory Visit (INDEPENDENT_AMBULATORY_CARE_PROVIDER_SITE_OTHER): Payer: BC Managed Care – PPO | Admitting: Neurology

## 2018-02-02 ENCOUNTER — Encounter: Payer: Self-pay | Admitting: Neurology

## 2018-02-02 VITALS — BP 130/82 | HR 80 | Ht 63.25 in | Wt 231.5 lb

## 2018-02-02 DIAGNOSIS — G35 Multiple sclerosis: Secondary | ICD-10-CM | POA: Diagnosis not present

## 2018-02-02 DIAGNOSIS — F988 Other specified behavioral and emotional disorders with onset usually occurring in childhood and adolescence: Secondary | ICD-10-CM | POA: Diagnosis not present

## 2018-02-02 DIAGNOSIS — F418 Other specified anxiety disorders: Secondary | ICD-10-CM | POA: Diagnosis not present

## 2018-02-02 DIAGNOSIS — R5383 Other fatigue: Secondary | ICD-10-CM | POA: Diagnosis not present

## 2018-02-02 DIAGNOSIS — R269 Unspecified abnormalities of gait and mobility: Secondary | ICD-10-CM

## 2018-02-02 MED ORDER — DOXEPIN HCL 10 MG PO CAPS: 10.0000 mg | ORAL_CAPSULE | Freq: Every day | ORAL | 5 refills | Status: DC

## 2018-02-02 MED ORDER — AMPHETAMINE-DEXTROAMPHET ER 30 MG PO CP24: 30.0000 mg | ORAL_CAPSULE | Freq: Every day | ORAL | 0 refills | Status: DC

## 2018-02-02 NOTE — Progress Notes (Signed)
[facility]  PATIENT: MICHELE JUDY DOB: 07/28/1960  REFERRING CLINICIAN: Anastasia Pall HISTORY FROM: Patient REASON FOR VISIT: MS   HISTORICAL  CHIEF COMPLAINT:  Chief Complaint  Patient presents with  . Follow-up    RM 12, alone. MS f/u. Last seen 07/2017. Had MRI brain/cervical completed 01/11/18  . Multiple Sclerosis    On Gilenya  . Fall    Fell last 01/09/18 and fell on right knee. Still having pain. Has had xray but looked ok. She also fell right before Thanksgiving.    HISTORY OF PRESENT ILLNESS:  Mrs. Borello is a 57 y.o. woman with MS.      Update 02/02/2018: She had a recent fall when she stepped on a rock in dim light and fell forward.    She has had a couple other falls.    She feels she is stable for the most part.    She is on Gilenya and she tolerates it well.  No exacerbations.  Her brain MRI from earlier this month was reviewed.  There are no new lesions.  The spinal cord does not have any MS lesions.  She does have some degenerative changes with mild spinal stenosis.  Legs feel mildly weak but this is stable.    She denies spasticity   Vision is stable.    She notes some daytime frequency and occasional urgency but has no nocturia.  Fatigue is tolerability.   She has sleep maintenance insomnia.  Ambien helps sleep onset some nights (5 mg).   Dogs barking keep her up some nights.   Attention deficit and focus issues are doing well.   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    Adderall also helps fatigue.    She has a broken 7th rib and a chipped bone in her foot.   Her right knee hurts but xray shoed no fracture.     She had breast cancer 12/17 and had surgery  X 5 and RadRx.  She has neck stiffness and reduced ROM looking to the left.    Update 07/18/2017: She denies definite MS exacerbations.  I personally reviewed the MRI of the brain dated 01/25/2017.  It did not show any new lesions and has chronic classic MS foci in the hemispheres.  She is  noting a few more issues.    She is doing worse with her walking and fell getting out of the bus chipping a bone in the right foot/ankle.   She had a second fall that was milder.    She feels she has some weakness in her legs    Denies spasticity in general but has occ. left arm spasms.   She had a recent DOT exam and vision was reduced (barely passed but did better with glasses).   She has mild bladder urgency  She feels her cognition is slightly worse.   Thinking/concentrating seems harder.  She left her keys in the mailbox.   This is frustrating.  Adderall has helped her focus.   She had insomnia and was placed on Ambien.   It works very fast and she gets at least 6 hours sleep.   The better sleep has helped fatigue. She tries to avoid heat.     Update 01/19/2017:   She has not had any definite MS exacerbation but she does feel that she is tripping more and has more numbness in her legs. She also notes that she feels more cognitively foggy. An MRI of the  brain from January 2017 have been stable compared to 2015 MRI.  She notes that she stumbles frequently but she has not had any significant fall. She denies any major weakness though the legs do feel little heavier. She also notes a little bit more numbness in the legs. Left > right.   She has mildl left buttock pain also.. She occasionally will have tingling in the arms. Bladder and bowel are doing well with just mild urgency and some nocturia now.   She does not note any MS related visual problems. She has had more fatigue and sleepiness this year. A PSG has shown snoring but no obstructive sleep apnea. Adderall has helped the fatigue is sleepiness. She has sleep maintenance insomnia, worse the past month.  She takes 1/2 gabapentin most nights.  She no longer takes Flexeril which helped her at night.   She has mild depression she denies any significant cognitive issues.          Earlier this year, she was diagnosed with right breast cancer. She has had  surgery and radiation.   She also has had left sided surgery.     From 07/15/2016: Breast cancer:   She was diagnosed with stage 1A breast cancer and had radiation after lumpectomy.   She just finished RadRx .    MS:    She is on Gilenya and she tolerates it well.   She denies any exacerbation or new MS symptoms.     She tolerates it well.   Her last MRI 02/12/2015 was unchanged compared to 11/17/2013 MRI (around time she started Gilenya).      Gait/strength/sensation:    Her gait is doing well. She has no recent falls. She denies any significant weakness or numbness in the arms or legs. She will feel some tingling in the hands at times but this is not painful.    Bladder/bowel:   She denies any significant issues with these.  Vision:   She denies any MS related vision problem.   She wears reading glasses.    Fatigue/Sleep:   She notes more fatigue and sleepiness this year.    She drives a school bus.   PSG showed snoring but no OSA.   Her fatigue and sleepiness are better on  Adderall.   She has sleep maintenance insomnia but no sleep onset issues.    Trazodone and Flexeril at bedtime have not helped.     Mood:   She is noting mildly more depression and anxiety.  Her mom dies in 04/20/22 and she was diagnosed with Breast ca in the past 6 months.  .     Cognition:    She notes mild difficulties with attention and focus which have improvement with Adderall.  MS History:  She was diagnosed with multiple sclerosis at age 17 after presenting with optic neuritis in the left eye. Her neurologist discussed with her that she probably had MS but no medication was started at that time. Vision got better over the next few weeks. A couple years later, she went to see Dr. Effie Shy. He felt that the MS should be treated and she was started on Rebif. She remain on Rebif for many years   She had not had any major exacerbations while on Rebif but had needles fatigue.    In 2014, she switched to [place]. She  tolerates Gilenya well.   REVIEW OF SYSTEMS:  Constitutional: No fevers, chills, sweats, or change in appetite.   She has  fatigue and mild insomnia (helped by Ambien) Eyes: No visual changes, double vision, eye pain Ear, nose and throat: No hearing loss, ear pain, nasal congestion, sore throat Cardiovascular: No chest pain, palpitations Respiratory:  No shortness of breath at rest or with exertion.   No wheezes GastrointestinaI: No nausea, vomiting, diarrhea, abdominal pain, fecal incontinence Genitourinary:  No dysuria, urinary retention or frequency.  No nocturia. Musculoskeletal:  Mild neck pain, no major back pain Integumentary: No rash, pruritus, skin lesions Neurological: as above Psychiatric: as above.    Endocrine: No palpitations, diaphoresis, change in appetite, change in weigh or increased thirst Hematologic/Lymphatic:  No anemia, purpura, petechiae. Allergic/Immunologic: No itchy/runny eyes, nasal congestion, recent allergic reactions, rashes  ALLERGIES: Allergies  Allergen Reactions  . Tape Hives    HOME MEDICATIONS: Outpatient Medications Prior to Visit  Medication Sig Dispense Refill  . amphetamine-dextroamphetamine (ADDERALL XR) 30 MG 24 hr capsule Take 1 capsule (30 mg total) by mouth daily. 30 capsule 0  . CALCIUM CARBONATE PO Take 250 mg by mouth daily.     . cholecalciferol (VITAMIN D) 1000 units tablet Take 1,000 Units by mouth daily.     . cyclobenzaprine (FLEXERIL) 10 MG tablet Take 1 tablet (10 mg total) by mouth at bedtime as needed. 90 tablet 3  . GILENYA 0.5 MG CAPS TAKE 1 CAPSULE BY MOUTH EVERY DAY 90 capsule 2  . IRON PO Take 1 tablet by mouth 2 (two) times a week. occ     . letrozole (FEMARA) 2.5 MG tablet Take 1 tablet (2.5 mg total) by mouth daily. 90 tablet 3  . meloxicam (MOBIC) 15 MG tablet TAKE 1 PO EVERY DAY WITH FOOD AS NEEDED FOR PAIN 90 tablet 3  . Multiple Vitamin (MULTIVITAMIN) tablet Take 1 tablet by mouth daily.    Marland Kitchen omeprazole  (PRILOSEC) 20 MG capsule Take 1 capsule (20 mg total) by mouth daily. 30 capsule 0  . ranitidine (ZANTAC) 150 MG capsule Take 1 capsule (150 mg total) by mouth daily. 30 capsule 0  . sertraline (ZOLOFT) 50 MG tablet TAKE 1 TABLET BY MOUTH EVERY DAY 90 tablet 1  . valACYclovir (VALTREX) 1000 MG tablet Take 0.5 tablets (500 mg total) by mouth daily. 30 tablet 12  . vitamin B-12 (CYANOCOBALAMIN) 1000 MCG tablet Take 1,000 mcg by mouth daily.     Marland Kitchen zolpidem (AMBIEN) 10 MG tablet      Facility-Administered Medications Prior to Visit  Medication Dose Route Frequency Provider Last Rate Last Dose  . gadopentetate dimeglumine (MAGNEVIST) injection 20 mL  20 mL Intravenous Once PRN Tarnisha Kachmar A, MD      . gadopentetate dimeglumine (MAGNEVIST) injection 20 mL  20 mL Intravenous Once PRN Keaton Beichner, Nanine Means, MD        PAST MEDICAL HISTORY: Past Medical History:  Diagnosis Date  . Anemia   . Anxiety   . Arthritis   . Cancer [facility])    right breast  . Depression   . History of radiation therapy 06/02/16-07/14/16   right breast 50.4 Gy in 28 fractions  . Memory loss   . Multiple sclerosis ([place])    age 38  . Neuropathy   . Shingles   . 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 Vision abnormalities   . Wears glasses     PAST SURGICAL HISTORY: Past Surgical History:  Procedure Laterality Date  . ADENOIDECTOMY    . BREAST LUMPECTOMY WITH RADIOACTIVE SEED AND SENTINEL LYMPH NODE BIOPSY  Right 03/25/2016   Procedure: RIGHT BREAST SEED GUIDED PARTIAL MASTECTOMY WITH RIGHT SENITINEL LYMPH NODE MAPPING;  Surgeon: Erroll Luna, MD;  Location: [place];  Service: General;  Laterality: Right;  . BREAST RECONSTRUCTION Right 03/25/2016   Procedure: RIGHT BREAST RECONSTRUCTION;  Surgeon: Loel Lofty Dillingham, DO;  Location: [place];  Service: Plastics;  Laterality: Right;  . BREAST REDUCTION SURGERY Right 03/25/2016   Procedure: MAMMARY REDUCTION  (BREAST) RIGHT;  Surgeon: Loel Lofty Dillingham, DO;   Location: [place];  Service: Plastics;  Laterality: Right;  . BREAST REDUCTION SURGERY Left 01/06/2017   Procedure: LEFT MAMMARY REDUCTION  (BREAST) FOR SYMMETRY WITH LIPOSUCTION;  Surgeon: Wallace Going, DO;  Location: [place];  Service: Plastics;  Laterality: Left;  . BREAST SURGERY Left 01/25/2018   breast reduction  . BUNIONECTOMY    . CESAREAN SECTION    . COLONOSCOPY W/ BIOPSIES AND POLYPECTOMY    . INCISION AND DRAINAGE OF WOUND Right 04/21/2016   Procedure: IRRIGATION AND DEBRIDEMENT RIGHT BREAST WOUND;  Surgeon: Wallace Going, DO;  Location: [place];  Service: Plastics;  Laterality: Right;  . MASS EXCISION Left 02/24/2017   Procedure: LEFT BREAST WOUND EXCISION AND CLOSURE WITH ACELL APPLICATION;  Surgeon: Wallace Going, DO;  Location: [place];  Service: Plastics;  Laterality: Left;  . TONSILLECTOMY      FAMILY HISTORY: Family History  Problem Relation Age of Onset  . Osteoporosis Mother   . Heart disease Mother   . Stroke Mother   . COPD Mother   . Cancer Father        colon  . Heart disease Brother   . Heart attack Brother     SOCIAL HISTORY:  Social History   Socioeconomic History  . Marital status: Divorced    Spouse name: Not on file  . Number of children: 1  . Years of education: Not on file  . Highest education level: Not on file  Occupational History  . Occupation: Designer, fashion/clothing  . Financial resource strain: Not on file  . Food insecurity:    Worry: Not on file    Inability: Not on file  . Transportation needs:    Medical: Not on file    Non-medical: Not on file  Tobacco Use  . Smoking status: Never Smoker  . Smokeless tobacco: Never Used  Substance and Sexual Activity  . Alcohol use: Yes    Comment: rare  . Drug use: No  . Sexual activity: Yes    Partners: Male    Birth control/protection: Condom    Comment: partner has had vasectomy  Lifestyle  . Physical  activity:    Days per week: Not on file    Minutes per session: Not on file  . Stress: Not on file  Relationships  . Social connections:    Talks on phone: Not on file    Gets together: Not on file    Attends religious service: Not on file    Active member of club or organization: Not on file    Attends meetings of clubs or organizations: Not on file    Relationship status: Not on file  . Intimate partner violence:    Fear of current or ex partner: Not on file    Emotionally abused: Not on file    Physically abused: Not on file    Forced sexual activity: Not on file  Other Topics Concern  . Not on  file  Social History Narrative  . Not on file     PHYSICAL EXAM  Vitals:   02/02/18 1259  BP: 130/82  Pulse: 80  Weight: 231 lb 8 oz (105 kg)  Height: 5' 3.25" (1.607 m)    Body mass index is 40.68 kg/m.   General: The patient is well-developed and well-nourished and in no acute distress  Musculoskeletal:   She has mild tenderness at the occiput.  Some mild left paraspinal muscle tenderness.  She has reduced range of motion of the neck looking left.  Neurologic Exam  Mental status: The patient is alert and oriented x 3 at the time of the examination. The patient has apparent normal recent and remote memory, with an apparently normal attention span and concentration ability.   Speech is normal.  Cranial nerves: Extraocular movements are full.  Facial strength and sensation is normal.  Trapezius strength is normal. The tongue is midline, and the patient has symmetric elevation of the soft palate. No obvious hearing deficits are noted.  Motor:  Muscle bulk and tone are normal. Strength is  5 / 5 in all 4 extremities.   Sensory: Intact sensation to touch and vibration in the arms and legs.  Coordination: Cerebellar testing reveals good finger-nose-finger bilaterally.  Gait and station: Station and gait are normal.  The tandem gait is mildly wide.  The Romberg is  negative.  Reflexes: Deep tendon reflexes are symmetric and brisk bilaterally. Marland Kitchen    DIAGNOSTIC DATA (LABS, IMAGING, TESTING) - I reviewed patient records, labs, notes, testing and imaging myself where available.  Lab Results  Component Value Date   WBC 4.9 08/02/2017   HGB 13.9 08/02/2017   HCT 42.0 08/02/2017   MCV 92.7 08/02/2017   PLT 292 08/02/2017      Component Value Date/Time   NA 141 08/02/2017 1710   NA 143 02/18/2016 1247   K 3.5 08/02/2017 1710   K 3.9 02/18/2016 1247   CL 104 08/02/2017 1710   CO2 26 08/02/2017 1710   CO2 27 02/18/2016 1247   GLUCOSE 101 (H) 08/02/2017 1710   GLUCOSE 83 02/18/2016 1247   BUN 17 08/02/2017 1710   BUN 18.8 02/18/2016 1247   CREATININE 0.91 08/02/2017 1710   CREATININE 0.8 02/18/2016 1247   CALCIUM 9.6 08/02/2017 1710   CALCIUM 9.5 02/18/2016 1247   PROT 7.2 08/02/2017 1710   PROT 7.2 02/18/2016 1247   ALBUMIN 4.4 08/02/2017 1710   ALBUMIN 4.2 02/18/2016 1247   AST 28 08/02/2017 1710   AST 31 02/18/2016 1247   ALT 40 08/02/2017 1710   ALT 45 02/18/2016 1247   ALKPHOS 104 08/02/2017 1710   ALKPHOS 109 02/18/2016 1247   BILITOT 1.1 08/02/2017 1710   BILITOT 0.80 02/18/2016 1247   GFRNONAA >60 08/02/2017 1710   GFRAA >60 08/02/2017 1710   Lab Results  Component Value Date   CHOL 161 10/17/2012   HDL 48 10/17/2012   LDLCALC 66 10/17/2012   TRIG 236 (H) 10/17/2012   CHOLHDL 3.4 10/17/2012   Lab Results  Component Value Date   HGBA1C 5.4 10/17/2012   No results found for: VITAMINB12 Lab Results  Component Value Date   TSH 1.71 04/01/2015      ASSESSMENT AND PLAN Multiple sclerosis (HCC)  Other fatigue  Attention deficit disorder, unspecified hyperactivity presence  Depression with anxiety  Gait disturbance   1.  Continue Gilenya.  Check CBC with differential today.  I personally reviewed the MRI of her  brain and cervical spine with her while she was in the office.  The MRI of the brain shows stable  white matter lesions consistent with MS.  She does not have any MS changes in the spine though she does have mild spinal stenosis.. 2.  Continue Adderall for her Fatigue, sleepiness and attention.  The prescription was called in. 3   exercise and stay active 4.  She will come back to see [place] in about 5-6 months or call sooner if she has new or worsening neurologic symptoms.   Carsyn Taubman A. Felecia Shelling, MD, PhD 49/67/5916, 3:84 PM Certified in Neurology, Clinical Neurophysiology, Sleep Medicine, Pain Medicine and Neuroimaging  [facility] [address] [place], [place] [address] [phone]

## 2018-02-03 ENCOUNTER — Telehealth: Payer: Self-pay | Admitting: *Deleted

## 2018-02-03 LAB — CBC WITH DIFFERENTIAL/PLATELET
BASOS ABS: 0 10*3/uL (ref 0.0–0.2)
Basos: 0 %
EOS (ABSOLUTE): 0.3 10*3/uL (ref 0.0–0.4)
Eos: 7 %
HEMATOCRIT: 39.3 % (ref 34.0–46.6)
Hemoglobin: 13.3 g/dL (ref 11.1–15.9)
Immature Grans (Abs): 0 10*3/uL (ref 0.0–0.1)
Immature Granulocytes: 1 %
LYMPHS ABS: 0.2 10*3/uL — AB (ref 0.7–3.1)
Lymphs: 6 %
MCH: 31 pg (ref 26.6–33.0)
MCHC: 33.8 g/dL (ref 31.5–35.7)
MCV: 92 fL (ref 79–97)
MONOS ABS: 0.4 10*3/uL (ref 0.1–0.9)
Monocytes: 12 %
Neutrophils Absolute: 2.6 10*3/uL (ref 1.4–7.0)
Neutrophils: 74 %
Platelets: 238 10*3/uL (ref 150–450)
RBC: 4.29 x10E6/uL (ref 3.77–5.28)
RDW: 13.7 % (ref 12.3–15.4)
WBC: 3.6 10*3/uL (ref 3.4–10.8)

## 2018-02-03 NOTE — Telephone Encounter (Signed)
Spoke to patient - she is aware of her lab results. 

## 2018-02-03 NOTE — Telephone Encounter (Signed)
-----   Message from Britt Bottom, MD sent at 02/03/2018 11:59 AM EST ----- Please let the patient know that the lab work is fine.   Low lymphocytes are typical for Gilenya.

## 2018-03-01 ENCOUNTER — Other Ambulatory Visit: Payer: Self-pay | Admitting: Neurology

## 2018-03-07 ENCOUNTER — Other Ambulatory Visit: Payer: Self-pay | Admitting: Neurology

## 2018-03-09 ENCOUNTER — Other Ambulatory Visit: Payer: Self-pay | Admitting: Neurology

## 2018-03-09 MED ORDER — AMPHETAMINE-DEXTROAMPHET ER 30 MG PO CP24: 30.0000 mg | ORAL_CAPSULE | Freq: Every day | ORAL | 0 refills | Status: DC

## 2018-03-09 NOTE — Telephone Encounter (Signed)
Pt is needing a refill on her amphetamine-dextroamphetamine (ADDERALL XR) 30 MG 24 hr capsule sent to [facility] in [place].

## 2018-04-17 ENCOUNTER — Other Ambulatory Visit: Payer: Self-pay | Admitting: Neurology

## 2018-04-17 MED ORDER — AMPHETAMINE-DEXTROAMPHET ER 30 MG PO CP24: 30.0000 mg | ORAL_CAPSULE | Freq: Every day | ORAL | 0 refills | Status: DC

## 2018-04-17 NOTE — Telephone Encounter (Signed)
Patient would like amphetamine-dextroamphetamine (ADDERALL XR) 30 MG 24 hr capsule refilled and sent to  [place], [place] [phone] (Phone) [phone] (Fax)

## 2018-05-03 ENCOUNTER — Other Ambulatory Visit: Payer: Self-pay | Admitting: Neurology

## 2018-05-04 ENCOUNTER — Ambulatory Visit: Payer: BC Managed Care – PPO | Admitting: Certified Nurse Midwife

## 2018-05-05 ENCOUNTER — Telehealth: Payer: Self-pay | Admitting: *Deleted

## 2018-05-05 MED ORDER — SERTRALINE HCL 50 MG PO TABS: 100.0000 mg | ORAL_TABLET | Freq: Every day | ORAL | 1 refills | Status: DC

## 2018-05-05 NOTE — Telephone Encounter (Signed)
Dr. Felecia Shelling received the email  Below from the patient.  He would like to do two things:  1) She is currently taking sertraline 50mg , one tablet daily.  He would like her to increase the dose to two tablets daily.  New rx will be sent to the pharmacy. 2) He would like to set up a video visit with her so that the other concerns can be addressed with him directly.  I have left the patient a detailed message with this information (ok per DPR).  I have also sent her an email.   Email from patient:  Dr Felecia Shelling. 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 Should I have a doctors note that I'm high risk.   I need to let you know all that's going on with me from head to toe.  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 Still the right foot has the broken bone in it.  The lower back aches most days. An now I've been told I will have to have both knees replaced in the next few years.  I'm getting so unsure of myself now an things I've done with work for 20 years I'm forgetting it's just now me. My cognitive thinking is not was it use to be for sure. Don't let me get started on a hot day or a cold day now they both seem to bother me I don't know if it's just me but I'm not myself an I'm wondering if I should be on disability I've worked hard all my life an I can't do the things I use to do .  I've worked with the ms for 26 years now an I need to know what to do to get started on being able to relax an stop worrying if I'm going to forget something all the time. Will u please let me know what I need to do

## 2018-05-05 NOTE — Addendum Note (Signed)
Addended by: Noberto Retort C on: 05/05/2018 10:50 AM   Modules accepted: Orders

## 2018-05-09 ENCOUNTER — Telehealth: Payer: Self-pay | Admitting: Hematology and Oncology

## 2018-05-09 NOTE — Telephone Encounter (Signed)
Cancelled appt per scheduled message - called patient back to r/s - unable to reach patient . Left message for patient to call back and r/s appt .

## 2018-05-10 ENCOUNTER — Ambulatory Visit: Payer: BC Managed Care – PPO | Admitting: Hematology and Oncology

## 2018-05-24 ENCOUNTER — Other Ambulatory Visit: Payer: Self-pay | Admitting: Neurology

## 2018-05-24 NOTE — Telephone Encounter (Signed)
Pt called in to refill amphetamine-dextroamphetamine (ADDERALL XR) 30 MG 24 hr capsule to be sent to [place], [place]

## 2018-05-25 MED ORDER — AMPHETAMINE-DEXTROAMPHET ER 30 MG PO CP24: 30.0000 mg | ORAL_CAPSULE | Freq: Every day | ORAL | 0 refills | Status: DC

## 2018-07-04 ENCOUNTER — Other Ambulatory Visit: Payer: Self-pay

## 2018-07-04 ENCOUNTER — Telehealth: Payer: Self-pay | Admitting: *Deleted

## 2018-07-04 MED ORDER — AMPHETAMINE-DEXTROAMPHET ER 30 MG PO CP24: 30.0000 mg | ORAL_CAPSULE | Freq: Every day | ORAL | 0 refills | Status: DC

## 2018-07-04 NOTE — Telephone Encounter (Signed)
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

## 2018-07-05 ENCOUNTER — Other Ambulatory Visit: Payer: Self-pay | Admitting: *Deleted

## 2018-07-05 ENCOUNTER — Telehealth: Payer: Self-pay | Admitting: *Deleted

## 2018-07-05 MED ORDER — MELOXICAM 15 MG PO TABS: ORAL_TABLET | ORAL | 3 refills | Status: DC

## 2018-07-05 MED ORDER — CYCLOBENZAPRINE HCL 10 MG PO TABS: 10.0000 mg | ORAL_TABLET | Freq: Every evening | ORAL | 3 refills | Status: DC | PRN

## 2018-07-05 NOTE — Telephone Encounter (Signed)
I faxed CMV Driver medication form to [place] @ [phone]

## 2018-07-17 NOTE — Progress Notes (Signed)
58 y.o.  [id number] Divorced  Caucasian Fe here for annual exam. Menopausal no vaginal bleeding. Has noted some vaginal dryness and not treating. Not sexual active currently due to partner has had surgery. No HSV outbreaks in the past year. Will need refill on Valtrex. Not taking Femara now. Primary Care Dr. Nancy Fetter with visit today. No change in MS at this point. Happy with left breast reconstruction now, that has healed. Doing breast exams monthly, with no changes. Aware she needs to work on weight for better health. Plans discussion with PCP today.Partner has had surgery, so some emotional stress, but coping well. No other health issues today.   Temp. 97.5 Patient's last menstrual period was 12/30/2014.          Sexually active: No  The current method of family planning is post menopausal status.    Exercising: No.  exercise Smoker:  no  Review of Systems  Constitutional: Negative.   HENT: Negative.   Eyes: Negative.   Respiratory: Negative.   Cardiovascular: Negative.   Gastrointestinal: Negative.   Genitourinary: Negative.   Musculoskeletal: Negative.   Skin: Negative.   Neurological: Negative.   Endo/Heme/Allergies: Negative.   Psychiatric/Behavioral: Negative.     Health Maintenance: Pap:  03-28-14 neg HPV HR neg, 04-13-17 neg HPV HR neg History of Abnormal Pap: no MMG:  01-23-18 category a density birads 2:neg Self Breast exams: yes Colonoscopy:  2017 f/u 61yrs BMD:   2018 normal TDaP:  2013 Shingles: no Pneumonia: no Hep C and HIV: both neg 2017 Labs: PCP   reports that she has never smoked. She has never used smokeless tobacco. She reports current alcohol use. She reports that she does not use drugs.  Past Medical History:  Diagnosis Date  . Anemia   . Anxiety   . Arthritis   . Cancer [facility])    right breast  . Depression   . Fractured rib   . History of radiation therapy 06/02/16-07/14/16   right breast 50.4 Gy in 28 fractions  . Memory loss   . Multiple sclerosis ([place])     age 82  . Neuropathy   . Shingles   . Shingles   . 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 Vision abnormalities   . Wears glasses     Past Surgical History:  Procedure Laterality Date  . ADENOIDECTOMY    . BREAST LUMPECTOMY WITH RADIOACTIVE SEED AND SENTINEL LYMPH NODE BIOPSY Right 03/25/2016   Procedure: RIGHT BREAST SEED GUIDED PARTIAL MASTECTOMY WITH RIGHT SENITINEL LYMPH NODE MAPPING;  Surgeon: Erroll Luna, MD;  Location: [place];  Service: General;  Laterality: Right;  . BREAST RECONSTRUCTION Right 03/25/2016   Procedure: RIGHT BREAST RECONSTRUCTION;  Surgeon: Loel Lofty Dillingham, DO;  Location: [place];  Service: Plastics;  Laterality: Right;  . BREAST REDUCTION SURGERY Right 03/25/2016   Procedure: MAMMARY REDUCTION  (BREAST) RIGHT;  Surgeon: Loel Lofty Dillingham, DO;  Location: [place];  Service: Plastics;  Laterality: Right;  . BREAST REDUCTION SURGERY Left 01/06/2017   Procedure: LEFT MAMMARY REDUCTION  (BREAST) FOR SYMMETRY WITH LIPOSUCTION;  Surgeon: Wallace Going, DO;  Location: [place];  Service: Plastics;  Laterality: Left;  . BREAST SURGERY Left 01/25/2018   breast reduction  . BUNIONECTOMY    . CESAREAN SECTION    . COLONOSCOPY W/ BIOPSIES AND POLYPECTOMY    . INCISION AND DRAINAGE OF WOUND Right 04/21/2016   Procedure: IRRIGATION AND DEBRIDEMENT RIGHT BREAST WOUND;  Surgeon:  [place], DO;  Location: [place];  Service: Plastics;  Laterality: Right;  . MASS EXCISION Left 02/24/2017   Procedure: LEFT BREAST WOUND EXCISION AND CLOSURE WITH ACELL APPLICATION;  Surgeon: Wallace Going, DO;  Location: [place];  Service: Plastics;  Laterality: Left;  . TONSILLECTOMY      Current Outpatient Medications  Medication Sig Dispense Refill  . amphetamine-dextroamphetamine (ADDERALL XR) 30 MG 24 hr capsule Take 1 capsule (30 mg total) by mouth daily. 30 capsule 0  . CALCIUM CARBONATE PO Take 250  mg by mouth daily.     . cholecalciferol (VITAMIN D) 1000 units tablet Take 1,000 Units by mouth daily.     . cyclobenzaprine (FLEXERIL) 10 MG tablet Take 1 tablet (10 mg total) by mouth at bedtime as needed. 90 tablet 3  . GILENYA 0.5 MG CAPS TAKE ONE CAPSULE BY MOUTH ONCE DAILY. MAY TAKE WITH OR WITHOUT FOOD. STORE AT ROOM TEMPERATURE. 90 capsule 2  . IRON PO Take 1 tablet by mouth 2 (two) times a week. occ     . letrozole (FEMARA) 2.5 MG tablet Take 1 tablet (2.5 mg total) by mouth daily. 90 tablet 3  . meloxicam (MOBIC) 15 MG tablet TAKE 1 PO EVERY DAY WITH FOOD AS NEEDED FOR PAIN 90 tablet 3  . Multiple Vitamin (MULTIVITAMIN) tablet Take 1 tablet by mouth daily.    . sertraline (ZOLOFT) 50 MG tablet Take 2 tablets (100 mg total) by mouth daily. 180 tablet 1  . valACYclovir (VALTREX) 1000 MG tablet Take 0.5 tablets (500 mg total) by mouth daily. 30 tablet 12  . vitamin B-12 (CYANOCOBALAMIN) 1000 MCG tablet Take 1,000 mcg by mouth daily.     Marland Kitchen zolpidem (AMBIEN) 10 MG tablet      No current facility-administered medications for this visit.    Facility-Administered Medications Ordered in Other Visits  Medication Dose Route Frequency Provider Last Rate Last Dose  . gadopentetate dimeglumine (MAGNEVIST) injection 20 mL  20 mL Intravenous Once PRN Sater, Richard A, MD      . gadopentetate dimeglumine (MAGNEVIST) injection 20 mL  20 mL Intravenous Once PRN Sater, Nanine Means, MD        Family History  Problem Relation Age of Onset  . Osteoporosis Mother   . Heart disease Mother   . Stroke Mother   . COPD Mother   . Cancer Father        colon  . Heart disease Brother   . Heart attack Brother     ROS:  Pertinent items are noted in HPI.  Otherwise, a comprehensive ROS was negative.  Exam:   BP 136/70   Pulse 68   Temp (!) 97.5 F (36.4 C) (Skin)   Resp 16   Ht 5' 3.25" (1.607 m)   Wt 236 lb (107 kg)   LMP 12/30/2014   BMI 41.48 kg/m  Height: 5' 3.25" (160.7 cm) Ht Readings from  Last 3 Encounters:  07/18/18 5' 3.25" (1.607 m)  02/02/18 5' 3.25" (1.607 m)  07/18/17 5' 3.25" (1.607 m)    General appearance: alert, cooperative and appears stated age Head: Normocephalic, without obvious abnormality, atraumatic Neck: no adenopathy, supple, symmetrical, trachea midline and thyroid normal to inspection and palpation Lungs: clear to auscultation bilaterally Breasts: normal appearance, no masses or tenderness, No nipple discharge or bleeding, No axillary or supraclavicular adenopathy, Bilateral reconstruction scarring Heart: regular rate and rhythm Abdomen: soft, non-tender; no masses,  no organomegaly Extremities:  extremities normal, atraumatic, no cyanosis or edema Skin: Skin color, texture, turgor normal. No rashes or lesions Lymph nodes: Cervical, supraclavicular, and axillary nodes normal. No abnormal inguinal nodes palpated Neurologic: Grossly normal   Pelvic: External genitalia:  no lesions              Urethra:  normal appearing urethra with no masses, tenderness or lesions              Bartholin's and Skene's: normal                 Vagina: normal appearing vagina with normal color and discharge, no lesions              Cervix: no bleeding following Pap, no cervical motion tenderness and no lesions              Pap taken: Yes.   Bimanual Exam:  Uterus:  normal size, contour, position, consistency, mobility, non-tender and mid position              Adnexa: normal adnexa and no mass, fullness, tenderness               Rectovaginal: Confirms               Anus:  normal sphincter tone, no lesions  Chaperone present: yes  A:  Well Woman with normal exam  Post menopausal no HRT  History of right breast cancer no medication now  Vaginal dryness  HSV 2 history needs RX refill  Medication management with PCP for Adderall, Vitamin D,Depression, Insomina  P:   Reviewed health and wellness pertinent to exam  Aware of need to advise if vaginal bleeding  Continue  SBE and yearly mammograms  Discussed options of OTC treatment, plans Coconut Oil trial.  Rx Valtrex see order with instructions  Continue follow up with PCP as indicated  Pap smear: yes   counseled on breast self exam, mammography screening, feminine hygiene, menopause, adequate intake of calcium and vitamin D, diet and exercise  return annually or prn  An After Visit Summary was printed and given to the patient.

## 2018-07-18 ENCOUNTER — Other Ambulatory Visit: Payer: Self-pay

## 2018-07-18 ENCOUNTER — Ambulatory Visit: Payer: BC Managed Care – PPO | Admitting: Certified Nurse Midwife

## 2018-07-18 ENCOUNTER — Other Ambulatory Visit (HOSPITAL_COMMUNITY)
Admission: RE | Admit: 2018-07-18 | Discharge: 2018-07-18 | Disposition: A | Discharge status: Home / Self Care | Payer: BC Managed Care – PPO | Source: Ambulatory Visit | Attending: Certified Nurse Midwife | Admitting: Certified Nurse Midwife

## 2018-07-18 ENCOUNTER — Encounter: Payer: Self-pay | Admitting: Certified Nurse Midwife

## 2018-07-18 VITALS — BP 136/70 | HR 68 | Temp 97.5°F | Resp 16 | Ht 63.25 in | Wt 236.0 lb

## 2018-07-18 DIAGNOSIS — Z853 Personal history of malignant neoplasm of breast: Secondary | ICD-10-CM | POA: Diagnosis not present

## 2018-07-18 DIAGNOSIS — Z124 Encounter for screening for malignant neoplasm of cervix: Secondary | ICD-10-CM | POA: Diagnosis not present

## 2018-07-18 DIAGNOSIS — Z01419 Encounter for gynecological examination (general) (routine) without abnormal findings: Secondary | ICD-10-CM | POA: Diagnosis not present

## 2018-07-18 DIAGNOSIS — A6009 Herpesviral infection of other urogenital tract: Secondary | ICD-10-CM

## 2018-07-18 MED ORDER — VALACYCLOVIR HCL 1 G PO TABS: 500.0000 mg | ORAL_TABLET | Freq: Every day | ORAL | 12 refills | Status: DC

## 2018-07-18 NOTE — Patient Instructions (Signed)

## 2018-07-19 LAB — CYTOLOGY - PAP
Adequacy: ABSENT
Diagnosis: NEGATIVE

## 2018-07-26 ENCOUNTER — Telehealth: Payer: Self-pay | Admitting: *Deleted

## 2018-07-26 NOTE — Telephone Encounter (Signed)
Sent pt mychart message

## 2018-08-08 ENCOUNTER — Encounter: Payer: Self-pay | Admitting: Neurology

## 2018-08-08 ENCOUNTER — Telehealth (INDEPENDENT_AMBULATORY_CARE_PROVIDER_SITE_OTHER): Payer: BC Managed Care – PPO | Admitting: Neurology

## 2018-08-08 DIAGNOSIS — G35 Multiple sclerosis: Secondary | ICD-10-CM | POA: Diagnosis not present

## 2018-08-08 DIAGNOSIS — F988 Other specified behavioral and emotional disorders with onset usually occurring in childhood and adolescence: Secondary | ICD-10-CM | POA: Diagnosis not present

## 2018-08-08 DIAGNOSIS — R5383 Other fatigue: Secondary | ICD-10-CM

## 2018-08-08 DIAGNOSIS — R269 Unspecified abnormalities of gait and mobility: Secondary | ICD-10-CM | POA: Diagnosis not present

## 2018-08-08 MED ORDER — AMPHETAMINE-DEXTROAMPHET ER 30 MG PO CP24: 30.0000 mg | ORAL_CAPSULE | Freq: Every day | ORAL | 0 refills | Status: DC

## 2018-08-08 NOTE — Progress Notes (Signed)
[facility]  PATIENT: Raven Montoya DOB: 04-29-1960  REFERRING CLINICIAN: Anastasia Pall HISTORY FROM: Patient REASON FOR VISIT: MS   HISTORICAL  CHIEF COMPLAINT:  Chief Complaint  Patient presents with  . Multiple Sclerosis    HISTORY OF PRESENT ILLNESS:  Raven Montoya is a 58 y.o. woman with MS.      Update 08/08/2018: Virtual Visit via Video Note I connected with Raven Montoya on 08/08/18 at  1:00 PM EDT by telephone (video enabled telemedicine application did not work) and verified that I am speaking with the correct person.  I discussed the limitations of evaluation and management by telemedicine and the availability of in person appointments. The patient expressed understanding and agreed to proceed.  History of Present Illness: She is on Gilenya.   She tolerates it well and has no recent exacerbation.   Her 01/13/18 MRI was unchanged compared to 2018.   She has left > right leg weakness and spasticity.  She has pain with the spasticity.  Flexeril has helped some.   She denies dysesthesias.  She has urinary frequency but no incontinence.  Vision is fine.  She notes some fatigue.  This is helped quite a bit by the Adderall.  Adderall also helps focus, attention and mild cognitive issues.  Mood is doing well currently.  She is sleeping much better with Ambien and tolerates it well.  There is no hangover in the morning.  She has been told her BP was mildly elevated and was just prescribed a HTN medications.  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  Assessment and Plan: 1. Multiple sclerosis (HCC)   2. Other fatigue   3. Attention deficit disorder, unspecified hyperactivity presence   4. Gait disturbance     1.   Continue Gilenya.  She will  be having blood work with her primary care doctor in July.  She will send a copy of the labs to [place]. 2.   Stay active and exercise as tolerated.  Flexeril as needed 3.   Continue Adderall for fatigue and attention/focus.  A new prescription was sent into the pharmacy. 4.    Return in 6 months or sooner if there are new or worsening neurologic symptoms.  Follow Up Instructions: I discussed the assessment and treatment plan with the patient. The patient was provided an opportunity to ask questions and all were answered. The patient agreed with the plan and demonstrated an understanding of the instructions.    The patient was advised to call back or seek an in-person evaluation if the symptoms worsen or if the condition fails to improve as anticipated.  I provided 25 minutes of non-face-to-face time during this encounter.  ______________________________ From previous visits: Update 02/02/2018: She had a recent fall when she stepped on a rock in dim light and fell forward.    She has had a couple other falls.    She feels she is stable for the most part.    She is on Gilenya and she tolerates it well.  No exacerbations.  Her brain MRI from earlier this month was reviewed.  There are no new lesions.  The spinal cord does not have any MS lesions.  She does have some degenerative changes with mild spinal stenosis.  Legs feel mildly weak but this is stable.    She denies spasticity   Vision is stable.    She notes some daytime frequency and occasional urgency but has no nocturia.  Fatigue is tolerability.   She has sleep maintenance insomnia.  Ambien helps sleep onset some nights (5 mg).   Dogs barking keep her up some nights.   Attention deficit and focus issues are doing well.   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    Adderall also helps fatigue.    She has a broken 7th rib and a chipped bone in her foot.   Her right knee hurts but xray shoed no fracture.     She had breast cancer 12/17 and had surgery  X  5 and RadRx.  She has neck stiffness and reduced ROM looking to the left.    Update 07/18/2017: She denies definite MS exacerbations.  I personally reviewed the MRI of the brain dated 01/25/2017.  It did not show any new lesions and has chronic classic MS foci in the hemispheres.  She is noting a few more issues.    She is doing worse with her walking and fell getting out of the bus chipping a bone in the right foot/ankle.   She had a second fall that was milder.    She feels she has some weakness in her legs    Denies spasticity in general but has occ. left arm spasms.   She had a recent DOT exam and vision was reduced (barely passed but did better with glasses).   She has mild bladder urgency  She feels her cognition is slightly worse.   Thinking/concentrating seems harder.  She left her keys in the mailbox.   This is frustrating.  Adderall has helped her focus.   She had insomnia and was placed on Ambien.   It works very fast and she gets at least 6 hours sleep.   The better sleep has helped fatigue. She tries to avoid heat.     Update 01/19/2017:   She has not had any definite MS exacerbation but she does feel that she is tripping more and has more numbness in her legs. She also notes that she feels more cognitively foggy. An MRI of the brain from January 2017 have been stable compared to 2015 MRI.  She notes that she stumbles frequently but she has not had any significant fall. She denies any major weakness though the legs do feel little heavier. She also notes a little bit more numbness in the legs. Left > right.   She has mildl left buttock pain also.. She occasionally will have tingling in the arms. Bladder and bowel are doing well with just mild urgency and some nocturia now.   She does not note any MS related visual problems. She has had more fatigue and sleepiness this year. A PSG has shown snoring but no obstructive sleep apnea. Adderall has helped the fatigue is sleepiness. She has sleep  maintenance insomnia, worse the past month.  She takes 1/2 gabapentin most nights.  She no longer takes Flexeril which helped her at night.   She has mild depression she denies any significant cognitive issues.          Earlier this year, she was diagnosed with right breast cancer. She has had surgery and radiation.   She also has had left sided surgery.     From 07/15/2016: Breast cancer:   She was diagnosed with stage  1A breast cancer and had radiation after lumpectomy.   She just finished RadRx .    MS:    She is on Gilenya and she tolerates it well.   She denies any exacerbation or new MS symptoms.     She tolerates it well.   Her last MRI 02/12/2015 was unchanged compared to 11/17/2013 MRI (around time she started Gilenya).      Gait/strength/sensation:    Her gait is doing well. She has no recent falls. She denies any significant weakness or numbness in the arms or legs. She will feel some tingling in the hands at times but this is not painful.    Bladder/bowel:   She denies any significant issues with these.  Vision:   She denies any MS related vision problem.   She wears reading glasses.    Fatigue/Sleep:   She notes more fatigue and sleepiness this year.    She drives a school bus.   PSG showed snoring but no OSA.   Her fatigue and sleepiness are better on  Adderall.   She has sleep maintenance insomnia but no sleep onset issues.    Trazodone and Flexeril at bedtime have not helped.     Mood:   She is noting mildly more depression and anxiety.  Her mom dies in April 19, 2022 and she was diagnosed with Breast ca in the past 6 months.  .     Cognition:    She notes mild difficulties with attention and focus which have improvement with Adderall.  MS History:  She was diagnosed with multiple sclerosis at age 48 after presenting with optic neuritis in the left eye. Her neurologist discussed with her that she probably had MS but no medication was started at that time. Vision got better over the next  few weeks. A couple years later, she went to see Dr. Effie Shy. He felt that the MS should be treated and she was started on Rebif. She remain on Rebif for many years   She had not had any major exacerbations while on Rebif but had needles fatigue.    In 2014, she switched to [place]. She tolerates Gilenya well.   REVIEW OF SYSTEMS:  Constitutional: No fevers, chills, sweats, or change in appetite.   She has fatigue and mild insomnia (helped by Ambien) Eyes: No visual changes, double vision, eye pain Ear, nose and throat: No hearing loss, ear pain, nasal congestion, sore throat Cardiovascular: No chest pain, palpitations Respiratory:  No shortness of breath at rest or with exertion.   No wheezes GastrointestinaI: No nausea, vomiting, diarrhea, abdominal pain, fecal incontinence Genitourinary:  No dysuria, urinary retention or frequency.  No nocturia. Musculoskeletal:  Mild neck pain, no major back pain Integumentary: No rash, pruritus, skin lesions Neurological: as above Psychiatric: as above.    Endocrine: No palpitations, diaphoresis, change in appetite, change in weigh or increased thirst Hematologic/Lymphatic:  No anemia, purpura, petechiae. Allergic/Immunologic: No itchy/runny eyes, nasal congestion, recent allergic reactions, rashes  ALLERGIES: Allergies  Allergen Reactions  . Tape Hives    HOME MEDICATIONS: Outpatient Medications Prior to Visit  Medication Sig Dispense Refill  . CALCIUM CARBONATE PO Take 250 mg by mouth daily.     . cholecalciferol (VITAMIN D) 1000 units tablet Take 1,000 Units by mouth daily.     . cyclobenzaprine (FLEXERIL) 10 MG tablet Take 1 tablet (10 mg total) by mouth at bedtime as needed. 90 tablet 3  . GILENYA 0.5 MG CAPS TAKE ONE CAPSULE BY  MOUTH ONCE DAILY. MAY TAKE WITH OR WITHOUT FOOD. STORE AT ROOM TEMPERATURE. 90 capsule 2  . HYDROcodone-acetaminophen (NORCO/VICODIN) 5-325 MG tablet TAKE 1 TO 2 TABLETS BY MOUTH THREE TIMES DAILY AS NEEDED     . IRON PO Take 1 tablet by mouth 2 (two) times a week. occ     . letrozole (FEMARA) 2.5 MG tablet Take 1 tablet (2.5 mg total) by mouth daily. 90 tablet 3  . meloxicam (MOBIC) 15 MG tablet TAKE 1 PO EVERY DAY WITH FOOD AS NEEDED FOR PAIN 90 tablet 3  . Multiple Vitamin (MULTIVITAMIN) tablet Take 1 tablet by mouth daily.    . sertraline (ZOLOFT) 50 MG tablet Take 2 tablets (100 mg total) by mouth daily. 180 tablet 1  . valACYclovir (VALTREX) 1000 MG tablet Take 0.5 tablets (500 mg total) by mouth daily. 30 tablet 12  . vitamin B-12 (CYANOCOBALAMIN) 1000 MCG tablet Take 1,000 mcg by mouth daily.     Marland Kitchen zolpidem (AMBIEN) 10 MG tablet     . amphetamine-dextroamphetamine (ADDERALL XR) 30 MG 24 hr capsule Take 1 capsule (30 mg total) by mouth daily. 30 capsule 0   Facility-Administered Medications Prior to Visit  Medication Dose Route Frequency Provider Last Rate Last Dose  . gadopentetate dimeglumine (MAGNEVIST) injection 20 mL  20 mL Intravenous Once PRN ,  A, MD      . gadopentetate dimeglumine (MAGNEVIST) injection 20 mL  20 mL Intravenous Once PRN , Nanine Means, MD        PAST MEDICAL HISTORY: Past Medical History:  Diagnosis Date  . Anemia   . Anxiety   . Arthritis   . Cancer [facility])    right breast  . Depression   . Fractured rib   . History of radiation therapy 06/02/16-07/14/16   right breast 50.4 Gy in 28 fractions  . Memory loss   . Multiple sclerosis ([place])    age 37  . Neuropathy   . Shingles   . Shingles   . 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 Vision abnormalities   . Wears glasses     PAST SURGICAL HISTORY: Past Surgical History:  Procedure Laterality Date  . ADENOIDECTOMY    . BREAST LUMPECTOMY WITH RADIOACTIVE SEED AND SENTINEL LYMPH NODE BIOPSY Right 03/25/2016   Procedure: RIGHT BREAST SEED GUIDED PARTIAL MASTECTOMY WITH RIGHT SENITINEL LYMPH NODE MAPPING;  Surgeon: Erroll Luna, MD;  Location: [facility];  Service: General;   Laterality: Right;  . BREAST RECONSTRUCTION Right 03/25/2016   Procedure: RIGHT BREAST RECONSTRUCTION;  Surgeon: Loel Lofty Dillingham, DO;  Location: [facility];  Service: Plastics;  Laterality: Right;  . BREAST REDUCTION SURGERY Right 03/25/2016   Procedure: MAMMARY REDUCTION  (BREAST) RIGHT;  Surgeon: Loel Lofty Dillingham, DO;  Location: [facility];  Service: Plastics;  Laterality: Right;  . BREAST REDUCTION SURGERY Left 01/06/2017   Procedure: LEFT MAMMARY REDUCTION  (BREAST) FOR SYMMETRY WITH LIPOSUCTION;  Surgeon: Wallace Going, DO;  Location: [facility];  Service: Plastics;  Laterality: Left;  . BREAST SURGERY Left 01/25/2018   breast reduction  . BUNIONECTOMY    . CESAREAN SECTION    . COLONOSCOPY W/ BIOPSIES AND POLYPECTOMY    . INCISION AND DRAINAGE OF WOUND Right 04/21/2016   Procedure: IRRIGATION AND DEBRIDEMENT RIGHT BREAST WOUND;  Surgeon: Wallace Going, DO;  Location: [facility];  Service: Plastics;  Laterality: Right;  . MASS EXCISION Left 02/24/2017   Procedure: LEFT  BREAST WOUND EXCISION AND CLOSURE WITH ACELL APPLICATION;  Surgeon: Wallace Going, DO;  Location: [facility];  Service: Plastics;  Laterality: Left;  . TONSILLECTOMY      FAMILY HISTORY: Family History  Problem Relation Age of Onset  . Osteoporosis Mother   . Heart disease Mother   . Stroke Mother   . COPD Mother   . Cancer Father        colon  . Heart disease Brother   . Heart attack Brother     SOCIAL HISTORY:  Social History   Socioeconomic History  . Marital status: Divorced    Spouse name: Not on file  . Number of children: 1  . Years of education: Not on file  . Highest education level: Not on file  Occupational History  . Occupation: Designer, fashion/clothing  . Financial resource strain: Not on file  . Food insecurity    Worry: Not on file    Inability: Not on file  . Transportation needs    Medical: Not on file    Non-medical: Not  on file  Tobacco Use  . Smoking status: Never Smoker  . Smokeless tobacco: Never Used  Substance and Sexual Activity  . Alcohol use: Yes    Comment: rare  . Drug use: No  . Sexual activity: Not Currently    Partners: Male    Birth control/protection: Condom    Comment: partner has had vasectomy  Lifestyle  . Physical activity    Days per week: Not on file    Minutes per session: Not on file  . Stress: Not on file  Relationships  . Social Herbalist on phone: Not on file    Gets together: Not on file    Attends religious service: Not on file    Active member of club or organization: Not on file    Attends meetings of clubs or organizations: Not on file    Relationship status: Not on file  . Intimate partner violence    Fear of current or ex partner: Not on file    Emotionally abused: Not on file    Physically abused: Not on file    Forced sexual activity: Not on file  Other Topics Concern  . Not on file  Social History Narrative  . Not on file     PHYSICAL EXAM  There were no vitals filed for this visit.  There is no height or weight on file to calculate BMI.   General: The patient is well-developed and well-nourished and in no acute distress  Musculoskeletal:   She has mild tenderness at the occiput.  Some mild left paraspinal muscle tenderness.  She has reduced range of motion of the neck looking left.  Neurologic Exam  Mental status: The patient is alert and oriented x 3 at the time of the examination. The patient has apparent normal recent and remote memory, with an apparently normal attention span and concentration ability.   Speech is normal.  Cranial nerves: Extraocular movements are full.  Facial strength and sensation is normal.  Trapezius strength is normal. The tongue is midline, and the patient has symmetric elevation of the soft palate. No obvious hearing deficits are noted.  Motor:  Muscle bulk and tone are normal. Strength is  5 / 5 in all  4 extremities.   Sensory: Intact sensation to touch and vibration in the arms and legs.  Coordination: Cerebellar testing reveals good finger-nose-finger  bilaterally.  Gait and station: Station and gait are normal.  The tandem gait is mildly wide.  The Romberg is negative.  Reflexes: Deep tendon reflexes are symmetric and brisk bilaterally. Nanine Means. Felecia Shelling, MD, PhD 04/05/7503, 1:83 PM Certified in Neurology, Clinical Neurophysiology, Sleep Medicine, Pain Medicine and Neuroimaging  [facility] [address] [place], [place] [address] [phone]

## 2018-08-14 ENCOUNTER — Other Ambulatory Visit: Payer: Self-pay | Admitting: *Deleted

## 2018-08-14 ENCOUNTER — Telehealth: Payer: Self-pay | Admitting: *Deleted

## 2018-08-14 DIAGNOSIS — M159 Polyosteoarthritis, unspecified: Secondary | ICD-10-CM

## 2018-08-14 MED ORDER — DICLOFENAC SODIUM 1 % TD GEL: 2.0000 g | Freq: Four times a day (QID) | TRANSDERMAL | 1 refills | Status: DC | PRN

## 2018-08-14 NOTE — Telephone Encounter (Signed)
Received fax notification from [place] that [place] approved 08/14/2018-08/13/2021. PA# [place] [phone] Non-grandfathered [id number]  Faxed notice of approval to [place] in [place], [place] at [phone]. Received fax confirmation.

## 2018-08-14 NOTE — Telephone Encounter (Signed)
Submitted PA diclofenac sodium 1% gel on CMM. Key: [id number]. PA Case ID: [id number] - Rx #: [id number] Waiting on determination.

## 2018-08-31 ENCOUNTER — Telehealth: Payer: Self-pay | Admitting: *Deleted

## 2018-08-31 NOTE — Telephone Encounter (Signed)
Faxed completed/signed PA form for Gilenya to [place] at [phone]. Received fax confirmation. Waiting on determination. Marked as urgent.

## 2018-09-04 NOTE — Telephone Encounter (Signed)
Received fax notification from [place] that [place] approved 08/31/18-08/31/19. PA# [employer] [phone]

## 2018-09-14 ENCOUNTER — Telehealth: Payer: Self-pay | Admitting: *Deleted

## 2018-09-14 ENCOUNTER — Other Ambulatory Visit: Payer: Self-pay | Admitting: Neurology

## 2018-09-14 MED ORDER — AMPHETAMINE-DEXTROAMPHET ER 30 MG PO CP24: 30.0000 mg | ORAL_CAPSULE | Freq: Every day | ORAL | 0 refills | Status: DC

## 2018-09-14 NOTE — Telephone Encounter (Signed)
Pt is requesting a refill of amphetamine-dextroamphetamine (ADDERALL XR) 30 MG 24 hr capsule , to be sent to [facility], [place]

## 2018-09-14 NOTE — Telephone Encounter (Signed)
Called pt. She last saw Dr. Felecia Shelling for VV 08/08/2018. Did not have 6 month f/u scheduled yet. I made one with her for 02/11/18 at 11:30am with Dr. Felecia Shelling. Advised I will send request to Dr. Felecia Shelling.  Checked drug registry. She last refilled rx adderall 08/08/18 #30.

## 2018-09-14 NOTE — Telephone Encounter (Signed)
Submitted Adderall XR 30mg  on CMM. Key: [id number]. Waiting on determination.

## 2018-09-14 NOTE — Telephone Encounter (Signed)
Received fax notification from [place] that [place] approved 09/14/18-09/13/21. PA# [place] [phone] Non-grandfathered [phone]

## 2018-10-18 ENCOUNTER — Other Ambulatory Visit: Payer: Self-pay

## 2018-10-18 MED ORDER — AMPHETAMINE-DEXTROAMPHET ER 30 MG PO CP24: 30.0000 mg | ORAL_CAPSULE | Freq: Every day | ORAL | 0 refills | Status: DC

## 2018-11-23 ENCOUNTER — Other Ambulatory Visit: Payer: Self-pay

## 2018-11-23 MED ORDER — AMPHETAMINE-DEXTROAMPHET ER 30 MG PO CP24: 30.0000 mg | ORAL_CAPSULE | Freq: Every day | ORAL | 0 refills | Status: DC

## 2018-12-31 ENCOUNTER — Other Ambulatory Visit: Payer: Self-pay

## 2019-01-01 MED ORDER — AMPHETAMINE-DEXTROAMPHET ER 30 MG PO CP24: 30.0000 mg | ORAL_CAPSULE | Freq: Every day | ORAL | 0 refills | Status: DC

## 2019-02-04 ENCOUNTER — Other Ambulatory Visit: Payer: Self-pay

## 2019-02-05 MED ORDER — AMPHETAMINE-DEXTROAMPHET ER 30 MG PO CP24: 30.0000 mg | ORAL_CAPSULE | Freq: Every day | ORAL | 0 refills | Status: DC

## 2019-02-12 ENCOUNTER — Other Ambulatory Visit: Payer: Self-pay

## 2019-02-12 ENCOUNTER — Ambulatory Visit (INDEPENDENT_AMBULATORY_CARE_PROVIDER_SITE_OTHER): Payer: BC Managed Care – PPO | Admitting: Neurology

## 2019-02-12 ENCOUNTER — Encounter: Payer: Self-pay | Admitting: Neurology

## 2019-02-12 VITALS — BP 151/91 | HR 87 | Temp 97.3°F | Ht 63.25 in | Wt 227.5 lb

## 2019-02-12 DIAGNOSIS — G35 Multiple sclerosis: Secondary | ICD-10-CM

## 2019-02-12 DIAGNOSIS — C50919 Malignant neoplasm of unspecified site of unspecified female breast: Secondary | ICD-10-CM | POA: Diagnosis not present

## 2019-02-12 DIAGNOSIS — F418 Other specified anxiety disorders: Secondary | ICD-10-CM | POA: Diagnosis not present

## 2019-02-12 DIAGNOSIS — M542 Cervicalgia: Secondary | ICD-10-CM | POA: Diagnosis not present

## 2019-02-12 DIAGNOSIS — F988 Other specified behavioral and emotional disorders with onset usually occurring in childhood and adolescence: Secondary | ICD-10-CM

## 2019-02-12 DIAGNOSIS — R269 Unspecified abnormalities of gait and mobility: Secondary | ICD-10-CM

## 2019-02-12 DIAGNOSIS — E559 Vitamin D deficiency, unspecified: Secondary | ICD-10-CM

## 2019-02-12 DIAGNOSIS — G35D Multiple sclerosis, unspecified: Secondary | ICD-10-CM

## 2019-02-12 MED ORDER — DULOXETINE HCL 60 MG PO CPEP: 60.0000 mg | ORAL_CAPSULE | Freq: Every day | ORAL | 3 refills | Status: DC

## 2019-02-12 NOTE — Progress Notes (Signed)
[facility]  PATIENT: Raven Montoya DOB: 1960-06-08  REFERRING CLINICIAN: Anastasia Pall HISTORY FROM: Patient REASON FOR VISIT: MS   HISTORICAL  CHIEF COMPLAINT:  Chief Complaint  Patient presents with  . Follow-up    RM 12, alone.  Last seen 07/2018. Doing well.   . Multiple Sclerosis    On Gilenya.    HISTORY OF PRESENT ILLNESS:  Raven Montoya is a 59 y.o. woman with MS.      Update 02/11/2018: She is on Gilenya and tolerates it well.   No exacerbations.   Her 01/13/18 MRI was unchanged compared to 2018.  Gait is the same.   She has left > right leg weakness and spasticity.  She has pain with the spasticity.  She does not more fatigue and pain.     Adderall has helped her mental fog and fatigue.    Mood is doing worse and she notes apathy now and we discussed a change in therapy.    She has pain in her neck and left shoulder and notes a left lower cervical paraspinal spasm.    ROM is mildlyreduced in her neck.    Update 08/08/2018 (virtual): She is on Gilenya.   She tolerates it well and has no recent exacerbation.   Her 01/13/18 MRI was unchanged compared to 2018.   She has left > right leg weakness and spasticity.  She has pain with the spasticity.  Flexeril has helped some.   She denies dysesthesias.  She has urinary frequency but no incontinence.  Vision is fine.  She notes some fatigue.  This is helped quite a bit by the Adderall.  Adderall also helps focus, attention and mild cognitive issues.  Mood is doing well currently.  She is sleeping much better with Ambien and tolerates it well.  There is no hangover in the morning.  She has been told her BP was mildly elevated and was just prescribed a HTN medications.  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    She has had a couple other falls.    She feels she is stable for the most part.    She is on Gilenya and she tolerates it well.  No exacerbations.  Her brain MRI from earlier this month was reviewed.  There are no new lesions.  The spinal cord does not have any MS lesions.  She does have some degenerative changes with mild spinal stenosis.  Legs feel mildly weak but this is stable.    She denies spasticity   Vision is stable.    She notes some daytime frequency and occasional urgency but has no nocturia.  Fatigue is tolerability.   She has sleep maintenance insomnia.  Ambien helps sleep onset some nights (5 mg).   Dogs barking keep her up some nights.   Attention deficit and focus issues are doing well.   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    Adderall also helps fatigue.    She has a broken 7th rib and a chipped bone in her foot.   Her right knee hurts but xray shoed no fracture.     She had breast cancer 12/17 and had surgery  X 5 and RadRx.  She  has neck stiffness and reduced ROM looking to the left.    Update 07/18/2017: She denies definite MS exacerbations.  I personally reviewed the MRI of the brain dated 01/25/2017.  It did not show any new lesions and has chronic classic MS foci in the hemispheres.  She is noting a few more issues.    She is doing worse with her walking and fell getting out of the bus chipping a bone in the right foot/ankle.   She had a second fall that was milder.    She feels she has some weakness in her legs    Denies spasticity in general but has occ. left arm spasms.   She had a recent DOT exam and vision was reduced (barely passed but did better with glasses).   She has mild bladder urgency  She feels her cognition is slightly worse.   Thinking/concentrating seems harder.  She left her keys in the mailbox.   This is frustrating.  Adderall has helped her focus.   She had insomnia and was placed on Ambien.   It works very fast and she gets at least 6 hours sleep.    The better sleep has helped fatigue. She tries to avoid heat.     Update 01/19/2017:   She has not had any definite MS exacerbation but she does feel that she is tripping more and has more numbness in her legs. She also notes that she feels more cognitively foggy. An MRI of the brain from January 2017 have been stable compared to 2015 MRI.  She notes that she stumbles frequently but she has not had any significant fall. She denies any major weakness though the legs do feel little heavier. She also notes a little bit more numbness in the legs. Left > right.   She has mildl left buttock pain also.. She occasionally will have tingling in the arms. Bladder and bowel are doing well with just mild urgency and some nocturia now.   She does not note any MS related visual problems. She has had more fatigue and sleepiness this year. A PSG has shown snoring but no obstructive sleep apnea. Adderall has helped the fatigue is sleepiness. She has sleep maintenance insomnia, worse the past month.  She takes 1/2 gabapentin most nights.  She no longer takes Flexeril which helped her at night.   She has mild depression she denies any significant cognitive issues.          Earlier this year, she was diagnosed with right breast cancer. She has had surgery and radiation.   She also has had left sided surgery.     From 07/15/2016: Breast cancer:   She was diagnosed with stage 1A breast cancer and had radiation after lumpectomy.   She just finished RadRx .    MS:    She is on Gilenya and she tolerates it well.   She denies any exacerbation or new MS symptoms.     She tolerates it well.   Her last MRI 02/12/2015 was unchanged compared to 11/17/2013 MRI (around time she started Gilenya).      Gait/strength/sensation:    Her gait is doing well. She has no recent falls. She denies any significant weakness or numbness in the arms or legs. She will feel some tingling in the hands at times but this is not painful.    Bladder/bowel:    She denies any significant issues with these.  Vision:   She denies any MS related vision  problem.   She wears reading glasses.    Fatigue/Sleep:   She notes more fatigue and sleepiness this year.    She drives a school bus.   PSG showed snoring but no OSA.   Her fatigue and sleepiness are better on  Adderall.   She has sleep maintenance insomnia but no sleep onset issues.    Trazodone and Flexeril at bedtime have not helped.     Mood:   She is noting mildly more depression and anxiety.  Her mom dies in 04/26/2022 and she was diagnosed with Breast ca in the past 6 months.  .     Cognition:    She notes mild difficulties with attention and focus which have improvement with Adderall.  MS History:  She was diagnosed with multiple sclerosis at age 33 after presenting with optic neuritis in the left eye. Her neurologist discussed with her that she probably had MS but no medication was started at that time. Vision got better over the next few weeks. A couple years later, she went to see Dr. Effie Shy. He felt that the MS should be treated and she was started on Rebif. She remain on Rebif for many years   She had not had any major exacerbations while on Rebif but had needles fatigue.    In 2014, she switched to [place]. She tolerates Gilenya well.   REVIEW OF SYSTEMS:  Constitutional: No fevers, chills, sweats, or change in appetite.   She has fatigue and mild insomnia (helped by Ambien) Eyes: No visual changes, double vision, eye pain Ear, nose and throat: No hearing loss, ear pain, nasal congestion, sore throat Cardiovascular: No chest pain, palpitations Respiratory:  No shortness of breath at rest or with exertion.   No wheezes GastrointestinaI: No nausea, vomiting, diarrhea, abdominal pain, fecal incontinence Genitourinary:  No dysuria, urinary retention or frequency.  No nocturia. Musculoskeletal:  Mild neck pain, no major back pain Integumentary: No rash, pruritus, skin  lesions Neurological: as above Psychiatric: as above.    Endocrine: No palpitations, diaphoresis, change in appetite, change in weigh or increased thirst Hematologic/Lymphatic:  No anemia, purpura, petechiae. Allergic/Immunologic: No itchy/runny eyes, nasal congestion, recent allergic reactions, rashes  ALLERGIES: Allergies  Allergen Reactions  . Tape Hives    HOME MEDICATIONS: Outpatient Medications Prior to Visit  Medication Sig Dispense Refill  . amphetamine-dextroamphetamine (ADDERALL XR) 30 MG 24 hr capsule Take 1 capsule (30 mg total) by mouth daily. 30 capsule 0  . CALCIUM CARBONATE PO Take 250 mg by mouth daily.     . cholecalciferol (VITAMIN D) 1000 units tablet Take 1,000 Units by mouth daily.     . cyclobenzaprine (FLEXERIL) 10 MG tablet Take 1 tablet (10 mg total) by mouth at bedtime as needed. 90 tablet 3  . diclofenac sodium (VOLTAREN) 1 % GEL Apply 2 g topically 4 (four) times daily as needed. 200 g 1  . GILENYA 0.5 MG CAPS TAKE ONE CAPSULE BY MOUTH ONCE DAILY. MAY TAKE WITH OR WITHOUT FOOD. STORE AT ROOM TEMPERATURE. 90 capsule 2  . HYDROcodone-acetaminophen (NORCO/VICODIN) 5-325 MG tablet TAKE 1 TO 2 TABLETS BY MOUTH THREE TIMES DAILY AS NEEDED    . IRON PO Take 1 tablet by mouth 2 (two) times a week. occ     . letrozole (FEMARA) 2.5 MG tablet Take 1 tablet (2.5 mg total) by mouth daily. 90 tablet 3  . meloxicam (MOBIC) 15 MG tablet TAKE 1 PO EVERY DAY WITH FOOD AS NEEDED FOR PAIN  90 tablet 3  . Multiple Vitamin (MULTIVITAMIN) tablet Take 1 tablet by mouth daily.    . sertraline (ZOLOFT) 50 MG tablet Take 2 tablets (100 mg total) by mouth daily. 180 tablet 1  . valACYclovir (VALTREX) 1000 MG tablet Take 0.5 tablets (500 mg total) by mouth daily. 30 tablet 12  . vitamin B-12 (CYANOCOBALAMIN) 1000 MCG tablet Take 1,000 mcg by mouth daily.     Marland Kitchen zolpidem (AMBIEN) 10 MG tablet      Facility-Administered Medications Prior to Visit  Medication Dose Route Frequency Provider  Last Rate Last Admin  . gadopentetate dimeglumine (MAGNEVIST) injection 20 mL  20 mL Intravenous Once PRN Issaac Shipper A, MD      . gadopentetate dimeglumine (MAGNEVIST) injection 20 mL  20 mL Intravenous Once PRN Bensen Chadderdon, Nanine Means, MD        PAST MEDICAL HISTORY: Past Medical History:  Diagnosis Date  . Anemia   . Anxiety   . Arthritis   . Cancer [facility])    right breast  . Depression   . Fractured rib   . History of radiation therapy 06/02/16-07/14/16   right breast 50.4 Gy in 28 fractions  . Memory loss   . Multiple sclerosis ([place])    age 4  . Neuropathy   . Shingles   . Shingles   . 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 Vision abnormalities   . Wears glasses     PAST SURGICAL HISTORY: Past Surgical History:  Procedure Laterality Date  . ADENOIDECTOMY    . BREAST LUMPECTOMY WITH RADIOACTIVE SEED AND SENTINEL LYMPH NODE BIOPSY Right 03/25/2016   Procedure: RIGHT BREAST SEED GUIDED PARTIAL MASTECTOMY WITH RIGHT SENITINEL LYMPH NODE MAPPING;  Surgeon: Erroll Luna, MD;  Location: [facility];  Service: General;  Laterality: Right;  . BREAST RECONSTRUCTION Right 03/25/2016   Procedure: RIGHT BREAST RECONSTRUCTION;  Surgeon: Loel Lofty Dillingham, DO;  Location: [facility];  Service: Plastics;  Laterality: Right;  . BREAST REDUCTION SURGERY Right 03/25/2016   Procedure: MAMMARY REDUCTION  (BREAST) RIGHT;  Surgeon: Loel Lofty Dillingham, DO;  Location: [facility];  Service: Plastics;  Laterality: Right;  . BREAST REDUCTION SURGERY Left 01/06/2017   Procedure: LEFT MAMMARY REDUCTION  (BREAST) FOR SYMMETRY WITH LIPOSUCTION;  Surgeon: Wallace Going, DO;  Location: [facility];  Service: Plastics;  Laterality: Left;  . BREAST SURGERY Left 01/25/2018   breast reduction  . BUNIONECTOMY    . CESAREAN SECTION    . COLONOSCOPY W/ BIOPSIES AND POLYPECTOMY    . INCISION AND DRAINAGE OF WOUND Right 04/21/2016   Procedure: IRRIGATION AND DEBRIDEMENT RIGHT BREAST WOUND;   Surgeon: Wallace Going, DO;  Location: [facility];  Service: Plastics;  Laterality: Right;  . MASS EXCISION Left 02/24/2017   Procedure: LEFT BREAST WOUND EXCISION AND CLOSURE WITH ACELL APPLICATION;  Surgeon: Wallace Going, DO;  Location: [facility];  Service: Plastics;  Laterality: Left;  . TONSILLECTOMY      FAMILY HISTORY: Family History  Problem Relation Age of Onset  . Osteoporosis Mother   . Heart disease Mother   . Stroke Mother   . COPD Mother   . Cancer Father        colon  . Heart disease Brother   . Heart attack Brother     SOCIAL HISTORY:  Social History   Socioeconomic History  . Marital status: Divorced    Spouse name: Not on file  .  Number of children: 1  . Years of education: Not on file  . Highest education level: Not on file  Occupational History  . Occupation: Recruitment consultant  Tobacco Use  . Smoking status: Never Smoker  . Smokeless tobacco: Never Used  Substance and Sexual Activity  . Alcohol use: Yes    Comment: rare  . Drug use: No  . Sexual activity: Not Currently    Partners: Male    Birth control/protection: Condom    Comment: partner has had vasectomy  Other Topics Concern  . Not on file  Social History Narrative  . Not on file   Social Determinants of Health   Financial Resource Strain:   . Difficulty of Paying Living Expenses: Not on file  Food Insecurity:   . Worried About Charity fundraiser in the Last Year: Not on file  . Ran Out of Food in the Last Year: Not on file  Transportation Needs:   . Lack of Transportation (Medical): Not on file  . Lack of Transportation (Non-Medical): Not on file  Physical Activity:   . Days of Exercise per Week: Not on file  . Minutes of Exercise per Session: Not on file  Stress:   . Feeling of Stress : Not on file  Social Connections:   . Frequency of Communication with Friends and Family: Not on file  . Frequency of Social Gatherings with Friends and  Family: Not on file  . Attends Religious Services: Not on file  . Active Member of Clubs or Organizations: Not on file  . Attends Archivist Meetings: Not on file  . Marital Status: Not on file  Intimate Partner Violence:   . Fear of Current or Ex-Partner: Not on file  . Emotionally Abused: Not on file  . Physically Abused: Not on file  . Sexually Abused: Not on file     PHYSICAL EXAM  Vitals:   02/12/19 1130  BP: (!) 151/91  Pulse: 87  Temp: (!) 97.3 F (36.3 C)  Weight: 227 lb 8 oz (103.2 kg)  Height: 5' 3.25" (1.607 m)    Body mass index is 39.98 kg/m.   General: The patient is well-developed and well-nourished and in no acute distress  Musculoskeletal:   She has tenderness in the paraspinal muscles around C7-T1 on the left and the trapezius muscle.  Mild reduced range of motion of the neck. Neurologic Exam  Mental status: The patient is alert and oriented x 3 at the time of the examination. The patient has apparent normal recent and remote memory, with an apparently normal attention span and concentration ability.   Speech is normal.  Cranial nerves: Extraocular movements are full.  Facial strength and sensation is normal.  Trapezius strength is normal.  Hearing is normal.  Motor:  Muscle bulk and tone are normal. Strength is  5 / 5 in all 4 extremities.   Sensory: Intact sensation to touch and vibration in the arms and legs.  Coordination: Cerebellar testing reveals good finger-nose-finger bilaterally.  Gait and station: Station and gait are normal.  Tandem gait is mildly wide.  Romberg is negative. Reflexes: Deep tendon reflexes are symmetric and brisk bilaterally. .   Multiple sclerosis ([place]) - Plan: CBC with Differential/Platelet, CMP  Gait disturbance  Depression with anxiety  Malignant neoplasm of female breast, unspecified estrogen receptor status, unspecified laterality, unspecified site of breast ([place])  Neck pain  Attention deficit  disorder, unspecified hyperactivity presence  Vitamin D deficiency - Plan: VITAMIN  D 25 Hydroxy (Vit-D Deficiency, Fractures)    1.   Continue Gilenya.  We will check some blood work today. 2.   Switch over Zoloft to Cymbalta 60 mg.  Continue Adderall 3.   Stay active and exercise as tolerated. 4.   Trigger point injection of the left C7-T1 paraspinal muscles and trapezius muscle with 80 mg Depo-Medrol and 3 cc Marcaine using sterile technique.  She tolerated the procedure well and there were no complications.   5.    Return in 6 months or sooner if there are new or worsening neurologic symptoms.  Charis Juliana A. Felecia Shelling, MD, PhD [id number], [id number] AM Certified in Neurology, Clinical Neurophysiology, Sleep Medicine, Pain Medicine and Neuroimaging  [facility] [address] [place], [place] [address] [phone]

## 2019-02-13 ENCOUNTER — Telehealth: Payer: Self-pay | Admitting: *Deleted

## 2019-02-13 LAB — COMPREHENSIVE METABOLIC PANEL
ALT: 52 IU/L — ABNORMAL HIGH (ref 0–32)
AST: 30 IU/L (ref 0–40)
Albumin/Globulin Ratio: 2.2 (ref 1.2–2.2)
Albumin: 4.1 g/dL (ref 3.8–4.9)
Alkaline Phosphatase: 115 IU/L (ref 39–117)
BUN/Creatinine Ratio: 20 (ref 9–23)
BUN: 19 mg/dL (ref 6–24)
Bilirubin Total: 0.5 mg/dL (ref 0.0–1.2)
CO2: 25 mmol/L (ref 20–29)
Calcium: 8.8 mg/dL (ref 8.7–10.2)
Chloride: 104 mmol/L (ref 96–106)
Creatinine, Ser: 0.96 mg/dL (ref 0.57–1.00)
GFR calc Af Amer: 76 mL/min/{1.73_m2} (ref >59)
GFR calc non Af Amer: 66 mL/min/{1.73_m2} (ref >59)
Globulin, Total: 1.9 g/dL (ref 1.5–4.5)
Glucose: 90 mg/dL (ref 65–99)
Potassium: 3.6 mmol/L (ref 3.5–5.2)
Sodium: 143 mmol/L (ref 134–144)
Total Protein: 6 g/dL (ref 6.0–8.5)

## 2019-02-13 LAB — CBC WITH DIFFERENTIAL/PLATELET
Basophils Absolute: 0 10*3/uL (ref 0.0–0.2)
Basos: 0 %
EOS (ABSOLUTE): 0.2 10*3/uL (ref 0.0–0.4)
Eos: 7 %
Hematocrit: 39.4 % (ref 34.0–46.6)
Hemoglobin: 13.1 g/dL (ref 11.1–15.9)
Immature Grans (Abs): 0 10*3/uL (ref 0.0–0.1)
Immature Granulocytes: 1 %
Lymphocytes Absolute: 0.2 10*3/uL — ABNORMAL LOW (ref 0.7–3.1)
Lymphs: 6 %
MCH: 31.1 pg (ref 26.6–33.0)
MCHC: 33.2 g/dL (ref 31.5–35.7)
MCV: 94 fL (ref 79–97)
Monocytes Absolute: 0.3 10*3/uL (ref 0.1–0.9)
Monocytes: 9 %
Neutrophils Absolute: 2.9 10*3/uL (ref 1.4–7.0)
Neutrophils: 77 %
Platelets: 206 10*3/uL (ref 150–450)
RBC: 4.21 x10E6/uL (ref 3.77–5.28)
RDW: 13.2 % (ref 11.7–15.4)
WBC: 3.6 10*3/uL (ref 3.4–10.8)

## 2019-02-13 LAB — VITAMIN D 25 HYDROXY (VIT D DEFICIENCY, FRACTURES): Vit D, 25-Hydroxy: 33.8 ng/mL (ref 30.0–100.0)

## 2019-02-13 NOTE — Telephone Encounter (Signed)
-----   Message from Britt Bottom, MD sent at 02/13/2019  8:50 AM EST ----- Please let her know that the lab work was mostly okay.  The vitamin D was low normal.  If she is not already doing so, she should take vitamin D 5000 units OTC daily.  One of the liver tests was slightly elevated but not enough to be concerned about.  The lymphocyte counts were low but that is expected with Gilenya.  No changes are necessary.

## 2019-03-01 ENCOUNTER — Other Ambulatory Visit: Payer: Self-pay | Admitting: Neurology

## 2019-03-14 ENCOUNTER — Other Ambulatory Visit: Payer: Self-pay

## 2019-03-15 MED ORDER — AMPHETAMINE-DEXTROAMPHET ER 30 MG PO CP24: 30.0000 mg | ORAL_CAPSULE | Freq: Every day | ORAL | 0 refills | Status: DC

## 2019-04-18 ENCOUNTER — Other Ambulatory Visit: Payer: Self-pay | Admitting: Neurology

## 2019-04-18 ENCOUNTER — Other Ambulatory Visit: Payer: Self-pay

## 2019-04-18 ENCOUNTER — Telehealth: Payer: Self-pay | Admitting: *Deleted

## 2019-04-18 MED ORDER — AMPHETAMINE-DEXTROAMPHET ER 30 MG PO CP24: 30.0000 mg | ORAL_CAPSULE | Freq: Every day | ORAL | 0 refills | Status: DC

## 2019-04-18 MED ORDER — OXYBUTYNIN CHLORIDE 5 MG PO TABS: 5.0000 mg | ORAL_TABLET | Freq: Two times a day (BID) | ORAL | 5 refills | Status: DC

## 2019-04-18 NOTE — Telephone Encounter (Signed)
Gave completed/signed CMV Driver medication form back to medical records to process for pt.

## 2019-04-19 ENCOUNTER — Telehealth: Payer: Self-pay | Admitting: *Deleted

## 2019-04-19 NOTE — Telephone Encounter (Signed)
Pt CMV form faxed to [facility]  @ [phone]

## 2019-04-30 ENCOUNTER — Encounter: Payer: Self-pay | Admitting: Certified Nurse Midwife

## 2019-05-18 ENCOUNTER — Other Ambulatory Visit: Payer: Self-pay

## 2019-05-18 MED ORDER — AMPHETAMINE-DEXTROAMPHET ER 30 MG PO CP24: 30.0000 mg | ORAL_CAPSULE | Freq: Every day | ORAL | 0 refills | Status: DC

## 2019-05-28 ENCOUNTER — Telehealth: Payer: Self-pay | Admitting: *Deleted

## 2019-05-28 NOTE — Telephone Encounter (Signed)
Gave completed/signed FMLA form back to medical records to process for pt.

## 2019-05-30 NOTE — Telephone Encounter (Signed)
Called [facility]. They state patient filled meloxicam three times with them and then rx was transferred to [facility] on 02/12/2019. Called them at [phone] to see if they have meloxicam on file there. Verified they have prescription on file for #90, 1 refill.

## 2019-06-04 ENCOUNTER — Other Ambulatory Visit: Payer: Self-pay | Admitting: Neurology

## 2019-06-28 ENCOUNTER — Other Ambulatory Visit: Payer: Self-pay | Admitting: Neurology

## 2019-06-29 MED ORDER — AMPHETAMINE-DEXTROAMPHET ER 30 MG PO CP24: 30.0000 mg | ORAL_CAPSULE | Freq: Every day | ORAL | 0 refills | Status: DC

## 2019-08-01 ENCOUNTER — Other Ambulatory Visit: Payer: Self-pay | Admitting: *Deleted

## 2019-08-01 MED ORDER — DULOXETINE HCL 60 MG PO CPEP: 60.0000 mg | ORAL_CAPSULE | Freq: Every day | ORAL | 3 refills | Status: DC

## 2019-08-15 ENCOUNTER — Ambulatory Visit: Payer: BC Managed Care – PPO | Admitting: Neurology

## 2019-08-15 ENCOUNTER — Other Ambulatory Visit: Payer: Self-pay

## 2019-08-15 ENCOUNTER — Encounter: Payer: Self-pay | Admitting: Neurology

## 2019-08-15 VITALS — BP 118/73 | HR 85 | Ht 63.25 in | Wt 221.0 lb

## 2019-08-15 DIAGNOSIS — Z79899 Other long term (current) drug therapy: Secondary | ICD-10-CM

## 2019-08-15 DIAGNOSIS — C50919 Malignant neoplasm of unspecified site of unspecified female breast: Secondary | ICD-10-CM

## 2019-08-15 DIAGNOSIS — R269 Unspecified abnormalities of gait and mobility: Secondary | ICD-10-CM

## 2019-08-15 DIAGNOSIS — R5383 Other fatigue: Secondary | ICD-10-CM

## 2019-08-15 DIAGNOSIS — G35 Multiple sclerosis: Secondary | ICD-10-CM | POA: Diagnosis not present

## 2019-08-15 DIAGNOSIS — F988 Other specified behavioral and emotional disorders with onset usually occurring in childhood and adolescence: Secondary | ICD-10-CM

## 2019-08-15 MED ORDER — MELOXICAM 15 MG PO TABS: ORAL_TABLET | ORAL | 3 refills | Status: DC

## 2019-08-15 MED ORDER — AMPHETAMINE-DEXTROAMPHET ER 30 MG PO CP24: 30.0000 mg | ORAL_CAPSULE | Freq: Every day | ORAL | 0 refills | Status: DC

## 2019-08-15 MED ORDER — CYCLOBENZAPRINE HCL 10 MG PO TABS: 10.0000 mg | ORAL_TABLET | Freq: Every evening | ORAL | 3 refills | Status: DC | PRN

## 2019-08-15 NOTE — Progress Notes (Signed)
[facility]  PATIENT: Raven Montoya DOB: [birth year]  REFERRING CLINICIAN: Anastasia Pall HISTORY FROM: Patient REASON FOR VISIT: MS   HISTORICAL  CHIEF COMPLAINT:  Chief Complaint  Patient presents with  . Follow-up    RM 13, alone. Last seen 02/12/2019. Needs refill on adderall sent to [facility]. Wondering if she should be taking flexeril, not taking currently.   . Multiple Sclerosis    On Gilenya. Tolerating well.     HISTORY OF PRESENT ILLNESS:  Raven Montoya is a 59 y.o. woman with relapsing remitting MS.      Update 08/15/2019: She is on Gilenya and tolerates it well.   No exacerbations.   Her 01/13/18 MRI was unchanged compared to 2018. Her gait is the same -- she could walk a mile without stopping.   However, due to poor balance she uses a cane for longer distance.   She needs to take care with stairs and sometimes does better going backwards holding there bannister.  She has mild left leg weakness and spasticity.  She has pain with the spasticity.  Spasms and cramps also occur at night.  This sometimes wakes her up.   Her bladder has done better since starting oxybutynin.      Adderall has helped her attention deficit nd fatigue.    She has depression and we recently switched her from Zoloft to Cymbalta.  She has pain in her neck and left shoulder and notes a left lower cervical paraspinal spasm.    ROM is mildly reduced in her neck.   Meloxicam has helped this pain ad she takes a couple times a week.      Update 02/11/2018: She is on Gilenya and tolerates it well.   No exacerbations.   Her 01/13/18 MRI was unchanged compared to 2018.  Gait is the same.   She has left > right leg weakness and spasticity.  She has pain with the spasticity.  She does not more fatigue and pain.     Adderall has helped her mental fog and fatigue.    Mood is doing worse and she notes apathy now and we discussed a change in therapy.    She has pain in her neck and left shoulder  and notes a left lower cervical paraspinal spasm.    ROM is mildlyreduced in her neck.    Update 08/08/2018 (virtual): She is on Gilenya.   She tolerates it well and has no recent exacerbation.   Her 01/13/18 MRI was unchanged compared to 2018.   She has left > right leg weakness and spasticity.  She has pain with the spasticity.  Flexeril has helped some.   She denies dysesthesias.  She has urinary frequency but no incontinence.  Vision is fine.  She notes some fatigue.  This is helped quite a bit by the Adderall.  Adderall also helps focus, attention and mild cognitive issues.  Mood is doing well currently.  She is sleeping much better with Ambien and tolerates it well.  There is no hangover in the morning.  She has been told her BP was mildly elevated and was just prescribed a HTN medications.  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    She has had  a couple other falls.    She feels she is stable for the most part.    She is on Gilenya and she tolerates it well.  No exacerbations.  Her brain MRI from earlier this month was reviewed.  There are no new lesions.  The spinal cord does not have any MS lesions.  She does have some degenerative changes with mild spinal stenosis.  Legs feel mildly weak but this is stable.    She denies spasticity   Vision is stable.    She notes some daytime frequency and occasional urgency but has no nocturia.  Fatigue is tolerability.   She has sleep maintenance insomnia.  Ambien helps sleep onset some nights (5 mg).   Dogs barking keep her up some nights.   Attention deficit and focus issues are doing well.   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    Adderall also helps fatigue.    She has a broken 7th rib and a chipped bone in her foot.   Her right knee hurts but xray  shoed no fracture.     She had breast cancer 12/17 and had surgery  X 5 and RadRx.  She has neck stiffness and reduced ROM looking to the left.    Update 07/18/2017: She denies definite MS exacerbations.  I personally reviewed the MRI of the brain dated 01/25/2017.  It did not show any new lesions and has chronic classic MS foci in the hemispheres.  She is noting a few more issues.    She is doing worse with her walking and fell getting out of the bus chipping a bone in the right foot/ankle.   She had a second fall that was milder.    She feels she has some weakness in her legs    Denies spasticity in general but has occ. left arm spasms.   She had a recent DOT exam and vision was reduced (barely passed but did better with glasses).   She has mild bladder urgency  She feels her cognition is slightly worse.   Thinking/concentrating seems harder.  She left her keys in the mailbox.   This is frustrating.  Adderall has helped her focus.   She had insomnia and was placed on Ambien.   It works very fast and she gets at least 6 hours sleep.   The better sleep has helped fatigue. She tries to avoid heat.     Update 01/19/2017:   She has not had any definite MS exacerbation but she does feel that she is tripping more and has more numbness in her legs. She also notes that she feels more cognitively foggy. An MRI of the brain from January 2017 have been stable compared to 2015 MRI.  She notes that she stumbles frequently but she has not had any significant fall. She denies any major weakness though the legs do feel little heavier. She also notes a little bit more numbness in the legs. Left > right.   She has mildl left buttock pain also.. She occasionally will have tingling in the arms. Bladder and bowel are doing well with just mild urgency and some nocturia now.   She does not note any MS related visual problems. She has had more fatigue and sleepiness this year. A PSG has shown snoring but no obstructive sleep  apnea. Adderall has helped the fatigue is sleepiness. She has sleep maintenance insomnia, worse the past month.  She takes 1/2 gabapentin most nights.  She no  longer takes Flexeril which helped her at night.   She has mild depression she denies any significant cognitive issues.          Earlier this year, she was diagnosed with right breast cancer. She has had surgery and radiation.   She also has had left sided surgery.     From 07/15/2016: Breast cancer:   She was diagnosed with stage 1A breast cancer and had radiation after lumpectomy.   She just finished RadRx .    MS:    She is on Gilenya and she tolerates it well.   She denies any exacerbation or new MS symptoms.     She tolerates it well.   Her last MRI 02/12/2015 was unchanged compared to 11/17/2013 MRI (around time she started Gilenya).      Gait/strength/sensation:    Her gait is doing well. She has no recent falls. She denies any significant weakness or numbness in the arms or legs. She will feel some tingling in the hands at times but this is not painful.    Bladder/bowel:   She denies any significant issues with these.  Vision:   She denies any MS related vision problem.   She wears reading glasses.    Fatigue/Sleep:   She notes more fatigue and sleepiness this year.    She drives a school bus.   PSG showed snoring but no OSA.   Her fatigue and sleepiness are better on  Adderall.   She has sleep maintenance insomnia but no sleep onset issues.    Trazodone and Flexeril at bedtime have not helped.     Mood:   She is noting mildly more depression and anxiety.  Her mom dies in April 22, 2022 and she was diagnosed with Breast ca in the past 6 months.  .     Cognition:    She notes mild difficulties with attention and focus which have improvement with Adderall.  MS History:  She was diagnosed with multiple sclerosis at age 66 after presenting with optic neuritis in the left eye. Her neurologist discussed with her that she probably had MS but no  medication was started at that time. Vision got better over the next few weeks. A couple years later, she went to see Dr. Effie Shy. He felt that the MS should be treated and she was started on Rebif. She remain on Rebif for many years   She had not had any major exacerbations while on Rebif but had needles fatigue.    In 2014, she switched to [place]. She tolerates Gilenya well.   REVIEW OF SYSTEMS:  Constitutional: No fevers, chills, sweats, or change in appetite.   She has fatigue and mild insomnia (helped by Ambien) Eyes: No visual changes, double vision, eye pain Ear, nose and throat: No hearing loss, ear pain, nasal congestion, sore throat Cardiovascular: No chest pain, palpitations Respiratory:  No shortness of breath at rest or with exertion.   No wheezes GastrointestinaI: No nausea, vomiting, diarrhea, abdominal pain, fecal incontinence Genitourinary:  No dysuria, urinary retention or frequency.  No nocturia. Musculoskeletal:  Mild neck pain, no major back pain Integumentary: No rash, pruritus, skin lesions Neurological: as above Psychiatric: as above.    Endocrine: No palpitations, diaphoresis, change in appetite, change in weigh or increased thirst Hematologic/Lymphatic:  No anemia, purpura, petechiae. Allergic/Immunologic: No itchy/runny eyes, nasal congestion, recent allergic reactions, rashes  ALLERGIES: Allergies  Allergen Reactions  . Tape Hives    HOME MEDICATIONS: Outpatient Medications Prior to  Visit  Medication Sig Dispense Refill  . CALCIUM CARBONATE PO Take 250 mg by mouth daily.     . cholecalciferol (VITAMIN D) 1000 units tablet Take 1,000 Units by mouth daily.     . diclofenac sodium (VOLTAREN) 1 % GEL Apply 2 g topically 4 (four) times daily as needed. 200 g 1  . DULoxetine (CYMBALTA) 60 MG capsule Take 1 capsule (60 mg total) by mouth daily. 90 capsule 3  . GILENYA 0.5 MG CAPS TAKE ONE CAPSULE BY MOUTH ONCE DAILY. MAY TAKE WITH OR WITHOUT FOOD. STORE  AT ROOM TEMPERATURE. 90 capsule 3  . HYDROcodone-acetaminophen (NORCO/VICODIN) 5-325 MG tablet TAKE 1 TO 2 TABLETS BY MOUTH THREE TIMES DAILY AS NEEDED    . IRON PO Take 1 tablet by mouth 2 (two) times a week. occ     . letrozole (FEMARA) 2.5 MG tablet Take 1 tablet (2.5 mg total) by mouth daily. 90 tablet 3  . Multiple Vitamin (MULTIVITAMIN) tablet Take 1 tablet by mouth daily.    Marland Kitchen oxybutynin (DITROPAN) 5 MG tablet Take 1 tablet (5 mg total) by mouth 2 (two) times daily. 60 tablet 5  . valACYclovir (VALTREX) 1000 MG tablet Take 0.5 tablets (500 mg total) by mouth daily. 30 tablet 12  . vitamin B-12 (CYANOCOBALAMIN) 1000 MCG tablet Take 1,000 mcg by mouth daily.     Marland Kitchen zolpidem (AMBIEN) 10 MG tablet 5 mg.     . amphetamine-dextroamphetamine (ADDERALL XR) 30 MG 24 hr capsule Take 1 capsule (30 mg total) by mouth daily. 30 capsule 0  . meloxicam (MOBIC) 15 MG tablet TAKE ONE (1) TABLET BY MOUTH EVERY DAY, WITH FOOD AS NEEDED FOR PAIN. 90 tablet 3  . cyclobenzaprine (FLEXERIL) 10 MG tablet Take 1 tablet (10 mg total) by mouth at bedtime as needed. (Patient not taking: Reported on 08/15/2019) 90 tablet 3   Facility-Administered Medications Prior to Visit  Medication Dose Route Frequency Provider Last Rate Last Admin  . gadopentetate dimeglumine (MAGNEVIST) injection 20 mL  20 mL Intravenous Once PRN Raffael Bugarin A, MD      . gadopentetate dimeglumine (MAGNEVIST) injection 20 mL  20 mL Intravenous Once PRN Hrishikesh Hoeg, Nanine Means, MD        PAST MEDICAL HISTORY: Past Medical History:  Diagnosis Date  . Anemia   . Anxiety   . Arthritis   . Cancer [facility])    right breast  . Depression   . Fractured rib   . History of radiation therapy 06/02/16-07/14/16   right breast 50.4 Gy in 28 fractions  . Memory loss   . Multiple sclerosis ([place])    age 40  . Neuropathy   . Shingles   . Shingles   . 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 Vision abnormalities   . Wears glasses     PAST  SURGICAL HISTORY: Past Surgical History:  Procedure Laterality Date  . ADENOIDECTOMY    . BREAST LUMPECTOMY WITH RADIOACTIVE SEED AND SENTINEL LYMPH NODE BIOPSY Right 03/25/2016   Procedure: RIGHT BREAST SEED GUIDED PARTIAL MASTECTOMY WITH RIGHT SENITINEL LYMPH NODE MAPPING;  Surgeon: Erroll Luna, MD;  Location: [place];  Service: General;  Laterality: Right;  . BREAST RECONSTRUCTION Right 03/25/2016   Procedure: RIGHT BREAST RECONSTRUCTION;  Surgeon: Loel Lofty Dillingham, DO;  Location: [place];  Service: Plastics;  Laterality: Right;  . BREAST REDUCTION SURGERY Right 03/25/2016   Procedure: MAMMARY REDUCTION  (BREAST) RIGHT;  Surgeon: Loel Lofty Dillingham, DO;  Location: [place];  Service: Plastics;  Laterality: Right;  . BREAST REDUCTION SURGERY Left 01/06/2017   Procedure: LEFT MAMMARY REDUCTION  (BREAST) FOR SYMMETRY WITH LIPOSUCTION;  Surgeon: Wallace Going, DO;  Location: [place];  Service: Plastics;  Laterality: Left;  . BREAST SURGERY Left 01/25/2018   breast reduction  . BUNIONECTOMY    . CESAREAN SECTION    . COLONOSCOPY W/ BIOPSIES AND POLYPECTOMY    . INCISION AND DRAINAGE OF WOUND Right 04/21/2016   Procedure: IRRIGATION AND DEBRIDEMENT RIGHT BREAST WOUND;  Surgeon: Wallace Going, DO;  Location: [place];  Service: Plastics;  Laterality: Right;  . MASS EXCISION Left 02/24/2017   Procedure: LEFT BREAST WOUND EXCISION AND CLOSURE WITH ACELL APPLICATION;  Surgeon: Wallace Going, DO;  Location: [place];  Service: Plastics;  Laterality: Left;  . TONSILLECTOMY      FAMILY HISTORY: Family History  Problem Relation Age of Onset  . Osteoporosis Mother   . Heart disease Mother   . Stroke Mother   . COPD Mother   . Cancer Father        colon  . Heart disease Brother   . Heart attack Brother     SOCIAL HISTORY:  Social History   Socioeconomic History  . Marital status: Divorced    Spouse name: Not on file  .  Number of children: 1  . Years of education: Not on file  . Highest education level: Not on file  Occupational History  . Occupation: Recruitment consultant  Tobacco Use  . Smoking status: Never Smoker  . Smokeless tobacco: Never Used  Substance and Sexual Activity  . Alcohol use: Yes    Comment: rare  . Drug use: No  . Sexual activity: Not Currently    Partners: Male    Birth control/protection: Condom    Comment: partner has had vasectomy  Other Topics Concern  . Not on file  Social History Narrative  . Not on file   Social Determinants of Health   Financial Resource Strain:   . Difficulty of Paying Living Expenses:   Food Insecurity:   . Worried About Charity fundraiser in the Last Year:   . Arboriculturist in the Last Year:   Transportation Needs:   . Film/video editor (Medical):   Marland Kitchen Lack of Transportation (Non-Medical):   Physical Activity:   . Days of Exercise per Week:   . Minutes of Exercise per Session:   Stress:   . Feeling of Stress :   Social Connections:   . Frequency of Communication with Friends and Family:   . Frequency of Social Gatherings with Friends and Family:   . Attends Religious Services:   . Active Member of Clubs or Organizations:   . Attends Archivist Meetings:   Marland Kitchen Marital Status:   Intimate Partner Violence:   . Fear of Current or Ex-Partner:   . Emotionally Abused:   Marland Kitchen Physically Abused:   . Sexually Abused:      PHYSICAL EXAM  Vitals:   08/15/19 1612  BP: 118/73  Pulse: 85  Weight: 221 lb (100.2 kg)  Height: 5' 3.25" (1.607 m)    Body mass index is 38.84 kg/m.   General: The patient is well-developed and well-nourished and in no acute distress  Musculoskeletal:   She has tenderness in the paraspinal muscles around C7-T1 on the left and the trapezius muscle.  Mild reduced range of  motion of the neck. Neurologic Exam  Mental status: The patient is alert and oriented x 3 at the time of the examination. The patient  has apparent normal recent and remote memory, with an apparently normal attention span and concentration ability.   Speech is normal.  Cranial nerves: Extraocular movements are full.  Facial strength and sensation is normal.  Trapezius strength is normal.  Hearing is normal.  Motor:  Muscle bulk and tone are normal. Strength is  5 / 5 in all 4 extremities.   Sensory: Intact sensation to touch and vibration in the arms and legs.  Coordination: Finger-nose-finger and heel-to-shin was performed well bilaterally.  Gait and station: Station and gait are normal.  Tandem gait is mildly wide.  Romberg is negative.  Reflexes: Deep tendon reflexes are symmetric and brisk bilaterally. .   Multiple sclerosis ([place]) - Plan: CBC with Differential/Platelet, Comprehensive metabolic panel  High risk medication use - Plan: CBC with Differential/Platelet, Comprehensive metabolic panel  Gait disturbance  Attention deficit disorder, unspecified hyperactivity presence  Other fatigue  Malignant neoplasm of female breast, unspecified estrogen receptor status, unspecified laterality, unspecified site of breast ([place])   1.   Continue Gilenya.  We will check some blood work today. 2.   Switch over Zoloft to Cymbalta 60 mg.  Continue Adderall 3.   Stay active and exercise as tolerated. 4.   Continue as needed meloxicam and reinstate nighttime Flexeril for spasticity 5.    Return in 6 months or sooner if there are new or worsening neurologic symptoms.  Shaquille Murdy A. Felecia Shelling, MD, PhD 08/14/7207, 4:70 PM Certified in Neurology, Clinical Neurophysiology, Sleep Medicine, Pain Medicine and Neuroimaging  [facility] [address], [place], [place] [address] [phone]

## 2019-08-16 ENCOUNTER — Other Ambulatory Visit: Payer: Self-pay | Admitting: *Deleted

## 2019-08-16 ENCOUNTER — Telehealth: Payer: Self-pay | Admitting: *Deleted

## 2019-08-16 DIAGNOSIS — G35 Multiple sclerosis: Secondary | ICD-10-CM

## 2019-08-16 DIAGNOSIS — Z79899 Other long term (current) drug therapy: Secondary | ICD-10-CM

## 2019-08-16 LAB — COMPREHENSIVE METABOLIC PANEL
ALT: 43 IU/L — ABNORMAL HIGH (ref 0–32)
AST: 34 IU/L (ref 0–40)
Albumin/Globulin Ratio: 1.9 (ref 1.2–2.2)
Albumin: 4.4 g/dL (ref 3.8–4.9)
Alkaline Phosphatase: 116 IU/L (ref 48–121)
BUN/Creatinine Ratio: 24 — ABNORMAL HIGH (ref 9–23)
BUN: 35 mg/dL — ABNORMAL HIGH (ref 6–24)
Bilirubin Total: 0.5 mg/dL (ref 0.0–1.2)
CO2: 26 mmol/L (ref 20–29)
Calcium: 9.6 mg/dL (ref 8.7–10.2)
Chloride: 102 mmol/L (ref 96–106)
Creatinine, Ser: 1.46 mg/dL — ABNORMAL HIGH (ref 0.57–1.00)
GFR calc Af Amer: 46 mL/min/{1.73_m2} — ABNORMAL LOW (ref >59)
GFR calc non Af Amer: 40 mL/min/{1.73_m2} — ABNORMAL LOW (ref >59)
Globulin, Total: 2.3 g/dL (ref 1.5–4.5)
Glucose: 120 mg/dL — ABNORMAL HIGH (ref 65–99)
Potassium: 4.5 mmol/L (ref 3.5–5.2)
Sodium: 143 mmol/L (ref 134–144)
Total Protein: 6.7 g/dL (ref 6.0–8.5)

## 2019-08-16 LAB — CBC WITH DIFFERENTIAL/PLATELET
Basophils Absolute: 0 10*3/uL (ref 0.0–0.2)
Basos: 0 %
EOS (ABSOLUTE): 0.4 10*3/uL (ref 0.0–0.4)
Eos: 7 %
Hematocrit: 38.2 % (ref 34.0–46.6)
Hemoglobin: 12.5 g/dL (ref 11.1–15.9)
Immature Grans (Abs): 0 10*3/uL (ref 0.0–0.1)
Immature Granulocytes: 0 %
Lymphocytes Absolute: 0.2 10*3/uL — ABNORMAL LOW (ref 0.7–3.1)
Lymphs: 5 %
MCH: 30.6 pg (ref 26.6–33.0)
MCHC: 32.7 g/dL (ref 31.5–35.7)
MCV: 94 fL (ref 79–97)
Monocytes Absolute: 0.4 10*3/uL (ref 0.1–0.9)
Monocytes: 7 %
Neutrophils Absolute: 4 10*3/uL (ref 1.4–7.0)
Neutrophils: 81 %
Platelets: 255 10*3/uL (ref 150–450)
RBC: 4.08 x10E6/uL (ref 3.77–5.28)
RDW: 13.9 % (ref 11.7–15.4)
WBC: 5 10*3/uL (ref 3.4–10.8)

## 2019-08-16 NOTE — Telephone Encounter (Signed)
-----   Message from Britt Bottom, MD sent at 08/16/2019  3:30 PM EDT ----- Please let her know that the blood counts were okay.  (Low lymphocytes expected with her medication)  Her kidney function tests showed that the kidneys were not filtering as well as they should.  I want her to stop the meloxicam (anti-inflammatories can sometimes make kidney function worse) and we can recheck a CMP in 2 to 3 weeks.  If it does not get better I would want her to see a kidney doctor.

## 2019-08-16 NOTE — Telephone Encounter (Signed)
Faxed completed/signed PA Gilenya to [facility] at [phone]. Received fax confirmation. Marked urgent. Waiting on determination.

## 2019-08-29 NOTE — Telephone Encounter (Signed)
Called [facility] at [phone] to check on status of PA. We have not received a determination yet. Spoke with [employer]. It was approved effective 08/16/19-08/15/20. [id number]. Asked she re-fax approval letter to [phone].

## 2019-08-30 ENCOUNTER — Other Ambulatory Visit: Payer: Self-pay

## 2019-08-30 ENCOUNTER — Other Ambulatory Visit (INDEPENDENT_AMBULATORY_CARE_PROVIDER_SITE_OTHER): Payer: Self-pay

## 2019-08-30 DIAGNOSIS — Z79899 Other long term (current) drug therapy: Secondary | ICD-10-CM

## 2019-08-30 DIAGNOSIS — Z0289 Encounter for other administrative examinations: Secondary | ICD-10-CM

## 2019-08-30 DIAGNOSIS — G35 Multiple sclerosis: Secondary | ICD-10-CM

## 2019-08-31 LAB — COMPREHENSIVE METABOLIC PANEL
ALT: 38 IU/L — ABNORMAL HIGH (ref 0–32)
AST: 38 IU/L (ref 0–40)
Albumin/Globulin Ratio: 2.4 — ABNORMAL HIGH (ref 1.2–2.2)
Albumin: 4.5 g/dL (ref 3.8–4.9)
Alkaline Phosphatase: 99 IU/L (ref 48–121)
BUN/Creatinine Ratio: 22 (ref 9–23)
BUN: 28 mg/dL — ABNORMAL HIGH (ref 6–24)
Bilirubin Total: 0.5 mg/dL (ref 0.0–1.2)
CO2: 26 mmol/L (ref 20–29)
Calcium: 9.2 mg/dL (ref 8.7–10.2)
Chloride: 97 mmol/L (ref 96–106)
Creatinine, Ser: 1.3 mg/dL — ABNORMAL HIGH (ref 0.57–1.00)
GFR calc Af Amer: 53 mL/min/{1.73_m2} — ABNORMAL LOW (ref >59)
GFR calc non Af Amer: 46 mL/min/{1.73_m2} — ABNORMAL LOW (ref >59)
Globulin, Total: 1.9 g/dL (ref 1.5–4.5)
Glucose: 107 mg/dL — ABNORMAL HIGH (ref 65–99)
Potassium: 4.6 mmol/L (ref 3.5–5.2)
Sodium: 138 mmol/L (ref 134–144)
Total Protein: 6.4 g/dL (ref 6.0–8.5)

## 2019-09-03 ENCOUNTER — Other Ambulatory Visit: Payer: Self-pay | Admitting: Neurology

## 2019-09-03 DIAGNOSIS — R7989 Other specified abnormal findings of blood chemistry: Secondary | ICD-10-CM

## 2019-09-03 DIAGNOSIS — R748 Abnormal levels of other serum enzymes: Secondary | ICD-10-CM

## 2019-09-03 NOTE — Progress Notes (Signed)
cmp

## 2019-09-12 ENCOUNTER — Other Ambulatory Visit: Payer: BC Managed Care – PPO

## 2019-09-13 ENCOUNTER — Other Ambulatory Visit (INDEPENDENT_AMBULATORY_CARE_PROVIDER_SITE_OTHER): Payer: Self-pay

## 2019-09-13 DIAGNOSIS — Z0289 Encounter for other administrative examinations: Secondary | ICD-10-CM

## 2019-09-13 DIAGNOSIS — R748 Abnormal levels of other serum enzymes: Secondary | ICD-10-CM

## 2019-09-13 DIAGNOSIS — R7989 Other specified abnormal findings of blood chemistry: Secondary | ICD-10-CM

## 2019-09-14 ENCOUNTER — Other Ambulatory Visit: Payer: Self-pay

## 2019-09-14 LAB — COMPREHENSIVE METABOLIC PANEL
ALT: 39 IU/L — ABNORMAL HIGH (ref 0–32)
AST: 29 IU/L (ref 0–40)
Albumin/Globulin Ratio: 2.3 — ABNORMAL HIGH (ref 1.2–2.2)
Albumin: 4.3 g/dL (ref 3.8–4.9)
Alkaline Phosphatase: 86 IU/L (ref 48–121)
BUN/Creatinine Ratio: 17 (ref 9–23)
BUN: 18 mg/dL (ref 6–24)
Bilirubin Total: 0.6 mg/dL (ref 0.0–1.2)
CO2: 26 mmol/L (ref 20–29)
Calcium: 9 mg/dL (ref 8.7–10.2)
Chloride: 104 mmol/L (ref 96–106)
Creatinine, Ser: 1.07 mg/dL — ABNORMAL HIGH (ref 0.57–1.00)
GFR calc Af Amer: 67 mL/min/{1.73_m2} (ref >59)
GFR calc non Af Amer: 58 mL/min/{1.73_m2} — ABNORMAL LOW (ref >59)
Globulin, Total: 1.9 g/dL (ref 1.5–4.5)
Glucose: 95 mg/dL (ref 65–99)
Potassium: 4.1 mmol/L (ref 3.5–5.2)
Sodium: 142 mmol/L (ref 134–144)
Total Protein: 6.2 g/dL (ref 6.0–8.5)

## 2019-09-17 MED ORDER — AMPHETAMINE-DEXTROAMPHET ER 30 MG PO CP24: 30.0000 mg | ORAL_CAPSULE | Freq: Every day | ORAL | 0 refills | Status: DC

## 2019-10-23 ENCOUNTER — Other Ambulatory Visit: Payer: Self-pay

## 2019-10-23 MED ORDER — AMPHETAMINE-DEXTROAMPHET ER 30 MG PO CP24: 30.0000 mg | ORAL_CAPSULE | Freq: Every day | ORAL | 0 refills | Status: DC

## 2019-11-22 ENCOUNTER — Other Ambulatory Visit: Payer: Self-pay

## 2019-11-22 MED ORDER — AMPHETAMINE-DEXTROAMPHET ER 30 MG PO CP24: 30.0000 mg | ORAL_CAPSULE | Freq: Every day | ORAL | 0 refills | Status: DC

## 2019-12-13 NOTE — Telephone Encounter (Signed)
Gave completed/signed forms back to medical records to process for pt.   

## 2019-12-27 ENCOUNTER — Other Ambulatory Visit: Payer: Self-pay

## 2019-12-28 ENCOUNTER — Other Ambulatory Visit: Payer: Self-pay

## 2019-12-31 MED ORDER — AMPHETAMINE-DEXTROAMPHET ER 30 MG PO CP24: 30.0000 mg | ORAL_CAPSULE | Freq: Every day | ORAL | 0 refills | Status: DC

## 2020-02-18 NOTE — Patient Instructions (Signed)
Below is our plan:  We will continue current treatment plan. Use Flexeril (start with 1/2 tablet) at night to help with muscle cramps and spasms. Be cautious with Ambien use. Monitor headaches. Let me know if they worsen.   Please make sure you are staying well hydrated. I recommend 50-60 ounces daily. Well balanced diet and regular exercise encouraged.    Please continue follow up with care team as directed.   Follow up with Dr Felecia Shelling in 6 months   You may receive a survey regarding today's visit. I encourage you to leave honest feed back as I do use this information to improve patient care. Thank you for seeing me today!      Multiple Sclerosis Multiple sclerosis (MS) is a disease of the brain, spinal cord, and optic nerves (central nervous system). It causes the body's disease-fighting (immune) system to destroy the protective covering (myelin sheath) around nerves in the brain. When this happens, signals (nerve impulses) going to and from the brain and spinal cord do not get sent properly or may not get sent at all. There are several types of MS:  Relapsing-remitting MS. This is the most common type. This causes sudden attacks of symptoms. After an attack, you may recover completely until the next attack, or some symptoms may remain permanently.  Secondary progressive MS. This usually develops after the onset of relapsing-remitting MS. Similar to relapsing-remitting MS, this type also causes sudden attacks of symptoms. Attacks may be less frequent, but symptoms slowly get worse (progress) over time.  Primary progressive MS. This causes symptoms that steadily progress over time. This type of MS does not cause sudden attacks of symptoms. The age of onset of MS varies, but it often develops between 23-25 years of age. MS is a lifelong (chronic) condition. There is no cure, but treatment can help slow down the progression of the disease. What are the causes? The cause of this condition is  not known. What increases the risk? You are more likely to develop this condition if:  You are a woman.  You have a relative with MS. However, the condition is not passed from parent to child (inherited).  You have a lack (deficiency) of vitamin D.  You smoke. MS is more common in the [place] than in the [place]. What are the signs or symptoms? Relapsing-remitting and secondary progressive MS cause symptoms to occur in episodes or attacks that may last weeks to months. There may be long periods between attacks in which there are almost no symptoms. Primary progressive MS causes symptoms to steadily progress after they develop. Symptoms of MS vary because of the many different ways it affects the central nervous system. The main symptoms include:  Vision problems and eye pain.  Numbness and weakness.  Inability to move your arms, hands, feet, or legs (paralysis).  Balance problems.  Shaking that you cannot control (tremors).  Muscle spasms.  Problems with thinking (cognitive changes). MS can also cause symptoms that are associated with the disease, but are not always the direct result of an MS attack. They may include:  Inability to control urination or bowel movements (incontinence).  Headaches.  Fatigue.  Inability to tolerate heat.  Emotional changes.  Depression.  Pain. How is this diagnosed? This condition is diagnosed based on:  Your symptoms.  A neurological exam. This involves checking central nervous system function, such as nerve function, reflexes, and coordination.  MRIs of the brain and spinal cord.  Lab  tests, including a lumbar puncture that tests the fluid that surrounds the brain and spinal cord (cerebrospinal fluid).  Tests to measure the electrical activity of the brain in response to stimulation (evoked potentials). How is this treated? There is no cure for MS, but medicines can help decrease the number and  frequency of attacks and help relieve nuisance symptoms. Treatment options may include:  Medicines that reduce the frequency of attacks. These medicines may be given by injection, by mouth (orally), or through an IV.  Medicines that reduce inflammation (steroids). These may provide short-term relief of symptoms.  Medicines to help control pain, depression, fatigue, or incontinence.  Nutritional counseling. Vitamin D supplements, if you have a deficiency.  Using devices to help you move around (assistive devices), such as braces, a cane, or a walker.  Physical therapy to strengthen and stretch your muscles.  Occupational therapy to help you with everyday tasks.  Alternative or complementary treatments such as exercise, massage, or acupuncture.   Follow these instructions at home:  Take over-the-counter and prescription medicines only as told by your health care provider.  Do not drive or use heavy machinery while taking prescription pain medicine.  Use assistive devices as recommended by your physical therapist or your health care provider.  Exercise as directed by your health care provider.  Eating healthy can help manage MS symptoms.  Return to your normal activities as told by your health care provider. Ask your health care provider what activities are safe for you.  Reach out for support. Share your feelings with friends, family, or a support group.  Keep all follow-up visits as told by your health care provider and therapists. This is important. Where to find more information  National Multiple Sclerosis Society: https://www.nationalmssociety.[place] of Neurological Disorders and Stroke: http://hendricks-barton.net/  [employer]ative [employer]: GourmetRating.dk Contact a health care provider if:  You feel depressed.  You develop new pain or numbness.  You have tremors.  You have problems with sexual  function. Get help right away if:  You develop paralysis.  You develop numbness.  You have problems with your bladder or bowel function.  You develop double vision.  You lose vision in one or both eyes.  You develop suicidal thoughts.  You develop severe confusion. If you ever feel like you may hurt yourself or others, or have thoughts about taking your own life, get help right away. You can go to your nearest emergency department or call:  Your local emergency services (911 in the [place]  A suicide crisis helpline, such as the [place] at 503-610-8347. This is open 24 hours a day. Summary  Multiple sclerosis (MS) is a disease of the central nervous system that causes the body's immune system to destroy the protective covering (myelin sheath) around nerves in the brain.  There are 3 types of MS: relapsing-remitting, secondary progressive, and primary progressive. Relapsing-remitting and secondary progressive MS cause symptoms to occur in episodes or attacks that may last weeks to months. Primary progressive MS causes symptoms to steadily progress after they develop.  There is no cure for MS, but medicines can help decrease the number and frequency of attacks and help relieve nuisance symptoms. Treatment may also include physical or occupational therapy.  If you develop numbness, paralysis, vision problems, or other neurological symptoms, get help right away. This information is not intended to replace advice given to you by your health care provider. Make sure you discuss any  questions you have with your health care provider. Document Revised: 11/06/2019 Document Reviewed: 11/06/2019 Elsevier Patient Education  2021 [employer].

## 2020-02-18 NOTE — Progress Notes (Signed)
Chief Complaint  Patient presents with   Follow-up    RM 2 alone Pt is      HISTORY OF PRESENT ILLNESS: Today 02/19/20  Raven Montoya is a 60 y.o. female here today for follow up for RRMS. She continues to tolerate Gilenya. MRI stable in 2019. Labs are stable.   She feels that she is doing fairly well. No new or exacerbating symptoms.  She is not using Flexeril 10mg  at bedtime. She thought that she was not supposed to take this due to kidney functioning. She did discontinue meloxicam .  Mood is stable on Cymbalta 60mg  daily. She is worried that it may be causing more headaches. She admits that she is not drinking enough water. She continues to have depression but feels she is doing ok. She sleeps ok. She feels that she usually gets about 6 hours of sleep each night. She does takes Ambien 5mg  nightly (PCP). Adderall XR 30mg  daily helps with inattention and MS fatigue.  Oxybutynin 5mg  BID helps with urinary frequency. 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   She continues to work with ortho for chronic neck pain due to spondylosis.  She reports that her provider is considering surgical interventions, however, they continue conservative management at this time. She is taking hydrocodone 5/325mg  up to TID.    HISTORY (copied from Dr Garth Bigness previous note)  Raven Montoya is a 60 y.o. woman with relapsing remitting MS.      Update 08/15/2019: She is on Gilenya and tolerates it well.   No exacerbations.   Her 01/13/18 MRI was unchanged compared to 2018. Her gait is the same -- she could walk a mile without stopping.   However, due to poor balance she uses a cane for longer distance.   She needs to take care with stairs and sometimes does better going backwards holding there bannister.  She has mild left leg weakness and spasticity.  She has pain with the spasticity.  Spasms and cramps also occur at night.  This sometimes wakes her up.    Her bladder has done better since starting oxybutynin.      Adderall has helped her attention deficit nd fatigue.    She has depression and we recently switched her from Zoloft to Cymbalta.  She has pain in her neck and left shoulder and notes a left lower cervical paraspinal spasm.    ROM is mildly reduced in her neck.   Meloxicam has helped this pain ad she takes a couple times a week.       REVIEW OF SYSTEMS: Out of a complete 14 system review of symptoms, the patient complains only of the following symptoms, muscle spasms, headaches, depression, inattention, fatigue and all other reviewed systems are negative.   ALLERGIES: Allergies  Allergen Reactions   Tape Hives     HOME MEDICATIONS: Outpatient Medications Prior to Visit  Medication Sig Dispense Refill   amphetamine-dextroamphetamine (ADDERALL XR) 30 MG 24 hr capsule Take 1 capsule (30 mg total) by mouth daily. 30 capsule 0   amphetamine-dextroamphetamine (ADDERALL XR) 30 MG 24 hr capsule Take 1 capsule (30 mg total) by mouth daily. 30 capsule 0   CALCIUM CARBONATE PO Take 250 mg by mouth daily.      cholecalciferol (VITAMIN D) 1000 units tablet Take 1,000 Units by mouth daily.      DULoxetine (CYMBALTA) 60 MG capsule Take 1 capsule (60 mg total)  by mouth daily. 90 capsule 3   GILENYA 0.5 MG CAPS TAKE ONE CAPSULE BY MOUTH ONCE DAILY. MAY TAKE WITH OR WITHOUT FOOD. STORE AT ROOM TEMPERATURE. 90 capsule 3   HYDROcodone-acetaminophen (NORCO/VICODIN) 5-325 MG tablet TAKE 1 TO 2 TABLETS BY MOUTH THREE TIMES DAILY AS NEEDED     IRON PO Take 1 tablet by mouth 2 (two) times a week. occ     letrozole (FEMARA) 2.5 MG tablet Take 1 tablet (2.5 mg total) by mouth daily. 90 tablet 3   Multiple Vitamin (MULTIVITAMIN) tablet Take 1 tablet by mouth daily.     oxybutynin (DITROPAN) 5 MG tablet Take 1 tablet (5 mg total) by mouth 2 (two) times daily. 60 tablet 5   valACYclovir (VALTREX) 1000 MG tablet Take 0.5 tablets (500 mg  total) by mouth daily. 30 tablet 12   vitamin B-12 (CYANOCOBALAMIN) 1000 MCG tablet Take 1,000 mcg by mouth daily.      zolpidem (AMBIEN) 10 MG tablet 5 mg.      diclofenac sodium (VOLTAREN) 1 % GEL Apply 2 g topically 4 (four) times daily as needed. (Patient not taking: Reported on 02/19/2020) 200 g 1   cyclobenzaprine (FLEXERIL) 10 MG tablet Take 1 tablet (10 mg total) by mouth at bedtime as needed. (Patient not taking: Reported on 02/19/2020) 90 tablet 3   Facility-Administered Medications Prior to Visit  Medication Dose Route Frequency Provider Last Rate Last Admin   gadopentetate dimeglumine (MAGNEVIST) injection 20 mL  20 mL Intravenous Once PRN Sater, Nanine Means, MD       gadopentetate dimeglumine (MAGNEVIST) injection 20 mL  20 mL Intravenous Once PRN Sater, Nanine Means, MD         PAST MEDICAL HISTORY: Past Medical History:  Diagnosis Date   Anemia    Anxiety    Arthritis    Cancer ([place])    right breast   Depression    Fractured rib    History of radiation therapy 06/02/16-07/14/16   right breast 50.4 Gy in 28 fractions   Memory loss    Multiple sclerosis ([place])    age 62   Neuropathy    Shingles    Shingles    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   Vision abnormalities    Wears glasses      PAST SURGICAL HISTORY: Past Surgical History:  Procedure Laterality Date   ADENOIDECTOMY     BREAST LUMPECTOMY WITH RADIOACTIVE SEED AND SENTINEL LYMPH NODE BIOPSY Right 03/25/2016   Procedure: RIGHT BREAST SEED GUIDED PARTIAL MASTECTOMY WITH RIGHT SENITINEL LYMPH NODE [place];  Surgeon: Erroll Luna, MD;  Location: [facility];  Service: General;  Laterality: Right;   BREAST RECONSTRUCTION Right 03/25/2016   Procedure: RIGHT BREAST RECONSTRUCTION;  Surgeon: Loel Lofty Dillingham, DO;  Location: [facility];  Service: Plastics;  Laterality: Right;   BREAST REDUCTION SURGERY Right 03/25/2016   Procedure: MAMMARY REDUCTION  (BREAST) RIGHT;  Surgeon: Loel Lofty  Dillingham, DO;  Location: [facility];  Service: Plastics;  Laterality: Right;   BREAST REDUCTION SURGERY Left 01/06/2017   Procedure: LEFT MAMMARY REDUCTION  (BREAST) FOR SYMMETRY WITH LIPOSUCTION;  Surgeon: Wallace Going, DO;  Location: [facility];  Service: Plastics;  Laterality: Left;   BREAST SURGERY Left 01/25/2018   breast reduction   BUNIONECTOMY     CESAREAN SECTION     COLONOSCOPY W/ BIOPSIES AND POLYPECTOMY     INCISION AND DRAINAGE OF WOUND Right 04/21/2016   Procedure:  IRRIGATION AND DEBRIDEMENT RIGHT BREAST WOUND;  Surgeon: Wallace Going, DO;  Location: [facility];  Service: Plastics;  Laterality: Right;   MASS EXCISION Left 02/24/2017   Procedure: LEFT BREAST WOUND EXCISION AND CLOSURE WITH ACELL APPLICATION;  Surgeon: Wallace Going, DO;  Location: [facility];  Service: Plastics;  Laterality: Left;   TONSILLECTOMY       FAMILY HISTORY: Family History  Problem Relation Age of Onset   Osteoporosis Mother    Heart disease Mother    Stroke Mother    COPD Mother    Cancer Father        colon   Heart disease Brother    Heart attack Brother      SOCIAL HISTORY: Social History   Socioeconomic History   Marital status: Divorced    Spouse name: Not on file   Number of children: 1   Years of education: Not on file   Highest education level: Not on file  Occupational History   Occupation: Recruitment consultant  Tobacco Use   Smoking status: Never Smoker   Smokeless tobacco: Never Used  Substance and Sexual Activity   Alcohol use: Yes    Comment: rare   Drug use: No   Sexual activity: Not Currently    Partners: Male    Birth control/protection: Condom    Comment: partner has had vasectomy  Other Topics Concern   Not on file  Social History Narrative   Not on file   Social Determinants of Health   Financial Resource Strain: Not on file  Food Insecurity: Not on file  Transportation  Needs: Not on file  Physical Activity: Not on file  Stress: Not on file  Social Connections: Not on file  Intimate Partner Violence: Not on file      PHYSICAL EXAM  Vitals:   02/19/20 1110  BP: 130/83  Pulse: 84  Weight: 216 lb 6.4 oz (98.2 kg)  Height: 5\' 4"  (1.626 m)   Body mass index is 37.14 kg/m.   Generalized: Well developed, in no acute distress  Cardiology: normal rate and rhythm, no murmur auscultated  Respiratory: clear to auscultation bilaterally    Neurological examination  Mentation: Alert oriented to time, place, history taking. Follows all commands speech and language fluent Cranial nerve II-XII: Pupils were equal round reactive to light. Extraocular movements were full, visual field were full on confrontational test. Facial sensation and strength were normal. Head turning and shoulder shrug  were normal and symmetric. Motor: The motor testing reveals 5 over 5 strength of all 4 extremities. Good symmetric motor tone is noted throughout.  Sensory: Sensory testing is intact to soft touch on all 4 extremities. No evidence of extinction is noted.  Coordination: Cerebellar testing reveals good finger-nose-finger and heel-to-shin bilaterally.  Gait and station: Gait is normal.  Reflexes: Deep tendon reflexes are symmetric and normal bilaterally.     DIAGNOSTIC DATA (LABS, IMAGING, TESTING) - I reviewed patient records, labs, notes, testing and imaging myself where available.  Lab Results  Component Value Date   WBC 5.0 08/15/2019   HGB 12.5 08/15/2019   HCT 38.2 08/15/2019   MCV 94 08/15/2019   PLT 255 08/15/2019      Component Value Date/Time   NA 142 09/13/2019 1141   NA 143 02/18/2016 1247   K 4.1 09/13/2019 1141   K 3.9 02/18/2016 1247   CL 104 09/13/2019 1141   CO2 26 09/13/2019 1141   CO2 27 02/18/2016  1247   GLUCOSE 95 09/13/2019 1141   GLUCOSE 101 (H) 08/02/2017 1710   GLUCOSE 83 02/18/2016 1247   BUN 18 09/13/2019 1141   BUN 18.8  02/18/2016 1247   CREATININE 1.07 (H) 09/13/2019 1141   CREATININE 0.8 02/18/2016 1247   CALCIUM 9.0 09/13/2019 1141   CALCIUM 9.5 02/18/2016 1247   PROT 6.2 09/13/2019 1141   PROT 7.2 02/18/2016 1247   ALBUMIN 4.3 09/13/2019 1141   ALBUMIN 4.2 02/18/2016 1247   AST 29 09/13/2019 1141   AST 31 02/18/2016 1247   ALT 39 (H) 09/13/2019 1141   ALT 45 02/18/2016 1247   ALKPHOS 86 09/13/2019 1141   ALKPHOS 109 02/18/2016 1247   BILITOT 0.6 09/13/2019 1141   BILITOT 0.80 02/18/2016 1247   GFRNONAA 58 (L) 09/13/2019 1141   GFRAA 67 09/13/2019 1141   Lab Results  Component Value Date   CHOL 161 10/17/2012   HDL 48 10/17/2012   LDLCALC 66 10/17/2012   TRIG 236 (H) 10/17/2012   CHOLHDL 3.4 10/17/2012   Lab Results  Component Value Date   HGBA1C 5.4 10/17/2012   No results found for: [id number] Lab Results  Component Value Date   TSH 1.71 04/01/2015    No flowsheet data found.   ASSESSMENT AND PLAN  60 y.o. year old female  has a past medical history of Anemia, Anxiety, Arthritis, Cancer ([place]), Depression, Fractured rib, History of radiation therapy (06/02/16-07/14/16), Memory loss, Multiple sclerosis ([place]), Neuropathy, Shingles, Shingles, STD (sexually transmitted disease), Vision abnormalities, and Wears glasses. here with   Relapsing remitting multiple sclerosis ([place]) - Plan: CBC with Differential/Platelets, CMP  High risk medication use - Plan: CBC with Differential/Platelets, CMP  Neck pain  Attention deficit disorder, unspecified hyperactivity presence  Depression with anxiety  Osteoarthritis of multiple joints, unspecified osteoarthritis type  Urinary frequency  Abnormal blood creatinine level  Raven Montoya is doing fairly well, today.  We will continue Gilenya as prescribed.  I will update labs today.  I have refilled cyclobenzaprine and advised that she may take 5 mg daily at bedtime to see if this helps with muscle aches and spasms.  I advised her to use caution  when taking Ambien, specifically when using cyclobenzaprine or hydrocodone. I have advised her not to take these medicaitons together.  She will continue Cymbalta for now but will monitor headaches closely.  She is working on adequate hydration.  She also follows closely with orthopedics and feels that neck pain could be contributing to headaches.  Depression seems stable.  I have offered a referral to psychiatry but she declines.  She will notify me of any worsening symptoms. She will continue Adderall 30 mg daily for inattention and MS fatigue.  Review of PDMP shows appropriate refills.  She will continue oxybutynin 5 mg twice daily for urinary urgency and frequency.  Healthy lifestyle habits encouraged.  She will follow-up with Dr. Felecia Shelling in 6 months, sooner if needed.  She verbalizes understanding and agreement with this plan.  Orders Placed This Encounter  Procedures   CBC with Differential/Platelets   CMP      I spent 30 minutes of face-to-face and non-face-to-face time with patient.  This included previsit chart review, lab review, study review, order entry, electronic health record documentation, patient education.    Debbora Presto, MSN, FNP-C 02/19/2020, 12:30 PM  [facility] [address] [place], [place] [address] [phone]

## 2020-02-19 ENCOUNTER — Ambulatory Visit: Payer: BC Managed Care – PPO | Admitting: Family Medicine

## 2020-02-19 ENCOUNTER — Encounter: Payer: Self-pay | Admitting: Family Medicine

## 2020-02-19 VITALS — BP 130/83 | HR 84 | Ht 64.0 in | Wt 216.4 lb

## 2020-02-19 DIAGNOSIS — G35 Multiple sclerosis: Secondary | ICD-10-CM

## 2020-02-19 DIAGNOSIS — F988 Other specified behavioral and emotional disorders with onset usually occurring in childhood and adolescence: Secondary | ICD-10-CM

## 2020-02-19 DIAGNOSIS — F418 Other specified anxiety disorders: Secondary | ICD-10-CM

## 2020-02-19 DIAGNOSIS — R350 Frequency of micturition: Secondary | ICD-10-CM

## 2020-02-19 DIAGNOSIS — M159 Polyosteoarthritis, unspecified: Secondary | ICD-10-CM

## 2020-02-19 DIAGNOSIS — M542 Cervicalgia: Secondary | ICD-10-CM | POA: Diagnosis not present

## 2020-02-19 DIAGNOSIS — Z79899 Other long term (current) drug therapy: Secondary | ICD-10-CM

## 2020-02-19 DIAGNOSIS — R7989 Other specified abnormal findings of blood chemistry: Secondary | ICD-10-CM

## 2020-02-19 MED ORDER — CYCLOBENZAPRINE HCL 10 MG PO TABS
10.0000 mg | ORAL_TABLET | Freq: Every evening | ORAL | 3 refills | Status: DC | PRN
Start: 2020-02-19 — End: 2021-02-12

## 2020-02-19 NOTE — Progress Notes (Signed)
I have read the note, and I agree with the clinical assessment and plan.  Cyrene Gharibian A. Lewayne Pauley, MD, PhD, FAAN Certified in Neurology, Clinical Neurophysiology, Sleep Medicine, Pain Medicine and Neuroimaging  [facility] [address], [address] [place], [place] [address] [phone]  

## 2020-02-20 LAB — COMPREHENSIVE METABOLIC PANEL
ALT: 17 IU/L (ref 0–32)
AST: 18 IU/L (ref 0–40)
Albumin/Globulin Ratio: 2.2 (ref 1.2–2.2)
Albumin: 4.6 g/dL (ref 3.8–4.9)
Alkaline Phosphatase: 98 IU/L (ref 44–121)
BUN/Creatinine Ratio: 17 (ref 9–23)
BUN: 22 mg/dL (ref 6–24)
Bilirubin Total: 0.7 mg/dL (ref 0.0–1.2)
CO2: 29 mmol/L (ref 20–29)
Calcium: 9.6 mg/dL (ref 8.7–10.2)
Chloride: 99 mmol/L (ref 96–106)
Creatinine, Ser: 1.3 mg/dL — ABNORMAL HIGH (ref 0.57–1.00)
GFR calc Af Amer: 53 mL/min/{1.73_m2} — ABNORMAL LOW (ref >59)
GFR calc non Af Amer: 46 mL/min/{1.73_m2} — ABNORMAL LOW (ref >59)
Globulin, Total: 2.1 g/dL (ref 1.5–4.5)
Glucose: 90 mg/dL (ref 65–99)
Potassium: 4.4 mmol/L (ref 3.5–5.2)
Sodium: 141 mmol/L (ref 134–144)
Total Protein: 6.7 g/dL (ref 6.0–8.5)

## 2020-02-20 LAB — CBC WITH DIFFERENTIAL/PLATELET
Basophils Absolute: 0 10*3/uL (ref 0.0–0.2)
Basos: 0 %
EOS (ABSOLUTE): 0.1 10*3/uL (ref 0.0–0.4)
Eos: 4 %
Hematocrit: 40.2 % (ref 34.0–46.6)
Hemoglobin: 13.5 g/dL (ref 11.1–15.9)
Immature Grans (Abs): 0 10*3/uL (ref 0.0–0.1)
Immature Granulocytes: 1 %
Lymphocytes Absolute: 0.3 10*3/uL — ABNORMAL LOW (ref 0.7–3.1)
Lymphs: 9 %
MCH: 30.9 pg (ref 26.6–33.0)
MCHC: 33.6 g/dL (ref 31.5–35.7)
MCV: 92 fL (ref 79–97)
Monocytes Absolute: 0.3 10*3/uL (ref 0.1–0.9)
Monocytes: 10 %
Neutrophils Absolute: 2.5 10*3/uL (ref 1.4–7.0)
Neutrophils: 76 %
Platelets: 252 10*3/uL (ref 150–450)
RBC: 4.37 x10E6/uL (ref 3.77–5.28)
RDW: 13.1 % (ref 11.7–15.4)
WBC: 3.3 10*3/uL — ABNORMAL LOW (ref 3.4–10.8)

## 2020-02-22 ENCOUNTER — Other Ambulatory Visit: Payer: Self-pay

## 2020-02-25 MED ORDER — AMPHETAMINE-DEXTROAMPHET ER 30 MG PO CP24: 30.0000 mg | ORAL_CAPSULE | Freq: Every day | ORAL | 0 refills | Status: DC

## 2020-02-28 ENCOUNTER — Other Ambulatory Visit: Payer: Self-pay | Admitting: *Deleted

## 2020-02-28 DIAGNOSIS — G35 Multiple sclerosis: Secondary | ICD-10-CM

## 2020-02-28 MED ORDER — GILENYA 0.5 MG PO CAPS: ORAL_CAPSULE | ORAL | 3 refills | Status: DC

## 2020-02-28 NOTE — Telephone Encounter (Signed)
Tried submitting PA on CMM. [id number]. Received the following response from [facility]: "Your PA has been resolved, no additional PA is required. For further inquiries please contact the number on the back of the member prescription card. (Message [phone])"

## 2020-03-21 ENCOUNTER — Encounter: Payer: Self-pay | Admitting: Family Medicine

## 2020-03-25 ENCOUNTER — Other Ambulatory Visit: Payer: Self-pay

## 2020-03-26 MED ORDER — AMPHETAMINE-DEXTROAMPHET ER 30 MG PO CP24: 30.0000 mg | ORAL_CAPSULE | Freq: Every day | ORAL | 0 refills | Status: DC

## 2020-03-31 ENCOUNTER — Telehealth: Payer: Self-pay

## 2020-03-31 NOTE — Telephone Encounter (Signed)
Dr. Felecia Shelling signed SRF for [place].  I have faxed this to [facility].  Received a receipt of confirmation.

## 2020-03-31 NOTE — Telephone Encounter (Signed)
Received notification from [place] program that a new start form is needed for patient's Gilenya refills.  Patient does not need to sign the start form, but Dr. Felecia Shelling will need to sign his part and a copy of patient's insurance in this must be faxed in.  Gilenya SRF given to Dr. Felecia Shelling for review.

## 2020-04-17 ENCOUNTER — Telehealth: Payer: Self-pay | Admitting: *Deleted

## 2020-04-17 NOTE — Telephone Encounter (Signed)
Gave completed/signed disability form back to medical records to process.

## 2020-04-21 ENCOUNTER — Telehealth: Payer: Self-pay | Admitting: *Deleted

## 2020-04-21 NOTE — Telephone Encounter (Signed)
Pt [place] form ready @ front desk for pick up.

## 2020-04-29 ENCOUNTER — Other Ambulatory Visit: Payer: Self-pay | Admitting: Family Medicine

## 2020-04-29 MED ORDER — AMPHETAMINE-DEXTROAMPHET ER 30 MG PO CP24: 30.0000 mg | ORAL_CAPSULE | Freq: Every day | ORAL | 0 refills | Status: DC

## 2020-05-02 ENCOUNTER — Other Ambulatory Visit: Payer: Self-pay | Admitting: Urology

## 2020-05-07 ENCOUNTER — Encounter (HOSPITAL_BASED_OUTPATIENT_CLINIC_OR_DEPARTMENT_OTHER): Payer: Self-pay | Admitting: Urology

## 2020-05-08 ENCOUNTER — Encounter (HOSPITAL_BASED_OUTPATIENT_CLINIC_OR_DEPARTMENT_OTHER): Payer: Self-pay | Admitting: Urology

## 2020-05-08 ENCOUNTER — Other Ambulatory Visit: Payer: Self-pay

## 2020-05-08 NOTE — Progress Notes (Signed)
Spoke w/ via phone for pre-op interview---pt Lab needs dos----   none            Lab results------lov neurology dr sater 08-15-2019 epic  COVID test ------05-09-2020 1215 Arrive at -------1300 pm 05-13-2020 NPO after MN NO Solid Food.  Clear liquids from MN until---1200 pm then npo Med rec completed Medications to take morning of surgery -----duloxetine Diabetic medication -----n/a Patient instructed to bring photo id and insurance card day of surgery Patient aware to have Driver (ride ) / caregiver  Lesleigh Noe   for 24 hours after surgery  Patient Special Instructions -----none Pre-Op special Istructions -----none Patient verbalized understanding of instructions that were given at this phone interview. Patient denies shortness of breath, chest pain, fever, cough at this phone interview.

## 2020-05-09 ENCOUNTER — Other Ambulatory Visit (HOSPITAL_COMMUNITY)
Admission: RE | Admit: 2020-05-09 | Discharge: 2020-05-09 | Disposition: A | Discharge status: Home / Self Care | Payer: BC Managed Care – PPO | Source: Ambulatory Visit | Attending: Urology | Admitting: Urology

## 2020-05-09 DIAGNOSIS — Z01812 Encounter for preprocedural laboratory examination: Secondary | ICD-10-CM | POA: Diagnosis present

## 2020-05-09 DIAGNOSIS — Z20822 Contact with and (suspected) exposure to covid-19: Secondary | ICD-10-CM | POA: Insufficient documentation

## 2020-05-09 LAB — SARS CORONAVIRUS 2 (TAT 6-24 HRS): SARS Coronavirus 2: NEGATIVE

## 2020-05-12 NOTE — H&P (Signed)
CC/HPI: cc: hydronephrosis   04/17/20: 60 year old woman referred for hydronephrosis. Renal ultrasound obtained at [facility] shows market right hydronephrosis according to radiology report. Images are not visible in PACs. Patient states renal ultrasound was done after lab work showed worsening kidney function. Patient has a history of MS and is followed by and neurologist as well as nephrologist. Patient used to be a school bus driver and was used to postponing urination for many hours during the day. She does get frequent urinary tract infections. She has stop taking oxybutynin now that she is no longer driving a school bus. She does have urinary frequency. She denies any fevers, chills, hematuria or flank pain.   05/02/20: 60 year old woman initially for for hydronephrosis found to have 7 mm right proximal ureteral calculus on CT performed 04/29/2020. Patient is asymptomatic.     ALLERGIES: No Known Drug Allergies    MEDICATIONS: Adderall 30 mg tablet  Cyclobenzaprine Hcl 10 mg tablet  Gilenya 0.5 mg capsule  Hydrocodone Bitartrate Er  Zoloft     GU PSH: Locm 300-399Mg /Ml Iodine,1Ml - 04/28/2020     NON-GU PSH: None   GU PMH: Hydronephrosis - 04/28/2020, Will obtain CT of the abdomen pelvis with contrast to further evaluate hydronephrosis and look for possible transition point or any other etiology that could be causing it. Patient to follow-up afterwards. Will repeat BUN and creatinine today., - 04/17/2020 Chronic cystitis (w/o hematuria), Patient is asymptomatic today however will send urine for culture in the event she becomes symptomatic. Recommend against treating asymptomatic bacteriuria. - 04/17/2020    NON-GU PMH: None   FAMILY HISTORY: None   SOCIAL HISTORY: None   REVIEW OF SYSTEMS:    GU Review Female:   Patient denies frequent urination, hard to postpone urination, burning /pain with urination, get up at night to urinate, leakage of urine, stream starts and  stops, trouble starting your stream, have to strain to urinate, and being pregnant.  Gastrointestinal (Upper):   Patient denies nausea, vomiting, and indigestion/ heartburn.  Gastrointestinal (Lower):   Patient denies diarrhea and constipation.  Constitutional:   Patient denies fever, night sweats, weight loss, and fatigue.  Skin:   Patient denies skin rash/ lesion and itching.  Eyes:   Patient denies blurred vision and double vision.  Ears/ Nose/ Throat:   Patient denies sore throat and sinus problems.  Hematologic/Lymphatic:   Patient denies swollen glands and easy bruising.  Cardiovascular:   Patient denies leg swelling and chest pains.  Respiratory:   Patient denies cough and shortness of breath.  Endocrine:   Patient denies excessive thirst.  Musculoskeletal:   Patient denies joint pain and back pain.  Neurological:   Patient denies headaches and dizziness.  Psychologic:   Patient denies depression and anxiety.   VITAL SIGNS:      05/02/2020 09:36 AM  Weight 218 lb / 98.88 kg  Height 64 in / 162.56 cm  BP 138/82 mmHg  Pulse 87 /min  Temperature 97.6 F / 36.4 C  BMI 37.4 kg/m   MULTI-SYSTEM PHYSICAL EXAMINATION:    Constitutional: Well-nourished. No physical deformities. Normally developed. Good grooming.  Neck: Neck symmetrical, not swollen. Normal tracheal position.  Respiratory: No labored breathing, no use of accessory muscles.   Skin: No paleness, no jaundice, no cyanosis. No lesion, no ulcer, no rash.  Neurologic / Psychiatric: Oriented to time, oriented to place, oriented to person. No depression, no anxiety, no agitation.  Gastrointestinal: No rigidity, non obese abdomen.   Eyes:  Normal conjunctivae. Normal eyelids.  Ears, Nose, Mouth, and Throat: Left ear no scars, no lesions, no masses. Right ear no scars, no lesions, no masses. Nose no scars, no lesions, no masses. Normal hearing. Normal lips.  Musculoskeletal: Normal gait and station of head and neck.      Complexity of Data:  Records Review:   Previous Patient Records  Urine Test Review:   Urinalysis  X-Ray Review: C.T. Abdomen/Pelvis: Reviewed Films. Reviewed Report. Discussed With Patient.    Notes:                     7 mm proximal ureteral calculus easily visible on KUB today   PROCEDURES:         KUB - 73710  A single view of the abdomen is obtained.      Patient confirmed No Neulasta OnPro Device.            Urinalysis w/Scope Dipstick Dipstick Cont'd Micro  Color: Yellow Bilirubin: Neg mg/dL WBC/hpf: 0 - 5/hpf  Appearance: Clear Ketones: Neg mg/dL RBC/hpf: 0 - 2/hpf  Specific Gravity: 1.020 Blood: Neg ery/uL Bacteria: Few (10-25/hpf)  pH: 6.0 Protein: Neg mg/dL Cystals: NS (Not Seen)  Glucose: Neg mg/dL Urobilinogen: 0.2 mg/dL Casts: NS (Not Seen)    Nitrites: Neg Trichomonas: Not Present    Leukocyte Esterase: 1+ leu/uL Mucous: Not Present      Epithelial Cells: 0 - 5/hpf      Yeast: NS (Not Seen)      Sperm: Not Present    ASSESSMENT:      ICD-10 Details  1 GU:   Chronic cystitis (w/o hematuria) - N30.20 Chronic, Stable  2   Ureteral calculus - G26.9 Acute, Uncomplicated  3   Ureteral obstruction secondary to calculous - S85.4 Acute, Uncomplicated   PLAN:           Orders X-Rays: KUB          Schedule         Document Letter(s):  Created for Patient: Clinical Summary         Notes:   Discussed CT findings which show a 7 mm obstructing right ureteral calculus. There is tortuosity of ureter proximal to this as well as hydronephrosis. Discussed management options including ESWL versus ureteroscopy with laser lithotripsy and stent placement. Given how long she has had hydronephrosis and the tortuosity of ureter and concern for stone impaction. I think she would be better served with ureteroscopy with laser lithotripsy and stent placement. The risks and benefits of this procedure were discussed with the patient she has elected to proceed. She will be scheduled  for surgery within 2 weeks.   cc: [name], MD

## 2020-05-13 ENCOUNTER — Other Ambulatory Visit: Payer: Self-pay

## 2020-05-13 ENCOUNTER — Ambulatory Visit (HOSPITAL_BASED_OUTPATIENT_CLINIC_OR_DEPARTMENT_OTHER): Payer: BC Managed Care – PPO | Admitting: Anesthesiology

## 2020-05-13 ENCOUNTER — Encounter (HOSPITAL_BASED_OUTPATIENT_CLINIC_OR_DEPARTMENT_OTHER): Payer: Self-pay | Admitting: Urology

## 2020-05-13 ENCOUNTER — Ambulatory Visit (HOSPITAL_BASED_OUTPATIENT_CLINIC_OR_DEPARTMENT_OTHER)
Admission: RE | Admit: 2020-05-13 | Discharge: 2020-05-13 | Disposition: A | Discharge status: Home / Self Care | Payer: BC Managed Care – PPO | Attending: Urology | Admitting: Urology

## 2020-05-13 ENCOUNTER — Encounter (HOSPITAL_BASED_OUTPATIENT_CLINIC_OR_DEPARTMENT_OTHER)
Admission: RE | Disposition: A | Discharge status: Home / Self Care | Payer: Self-pay | Source: Home / Self Care | Attending: Urology

## 2020-05-13 DIAGNOSIS — Z87440 Personal history of urinary (tract) infections: Secondary | ICD-10-CM | POA: Diagnosis not present

## 2020-05-13 DIAGNOSIS — N3020 Other chronic cystitis without hematuria: Secondary | ICD-10-CM | POA: Diagnosis not present

## 2020-05-13 DIAGNOSIS — N201 Calculus of ureter: Secondary | ICD-10-CM | POA: Diagnosis present

## 2020-05-13 DIAGNOSIS — N132 Hydronephrosis with renal and ureteral calculous obstruction: Secondary | ICD-10-CM | POA: Insufficient documentation

## 2020-05-13 HISTORY — DX: Urgency of urination: R39.15

## 2020-05-13 HISTORY — DX: Personal history of urinary calculi: Z87.442

## 2020-05-13 HISTORY — PX: CYSTOSCOPY WITH RETROGRADE PYELOGRAM, URETEROSCOPY AND STENT PLACEMENT: SHX5789

## 2020-05-13 HISTORY — PX: HOLMIUM LASER APPLICATION: SHX5852

## 2020-05-13 HISTORY — DX: Personal history of other diseases of the digestive system: Z87.19

## 2020-05-13 SURGERY — CYSTOURETEROSCOPY, WITH RETROGRADE PYELOGRAM AND STENT INSERTION
Anesthesia: General | Site: Ureter | Laterality: Right

## 2020-05-13 MED ORDER — CEFAZOLIN SODIUM-DEXTROSE 2-4 GM/100ML-% IV SOLN
2.0000 g | INTRAVENOUS | Status: AC
  Administered 2020-05-13: 2 g via INTRAVENOUS

## 2020-05-13 MED ORDER — FENTANYL CITRATE (PF) 100 MCG/2ML IJ SOLN
INTRAMUSCULAR | Status: DC | PRN
  Administered 2020-05-13: 50 ug via INTRAVENOUS
  Administered 2020-05-13: 25 ug via INTRAVENOUS
  Administered 2020-05-13: 50 ug via INTRAVENOUS
  Administered 2020-05-13: 25 ug via INTRAVENOUS
  Administered 2020-05-13: 50 ug via INTRAVENOUS

## 2020-05-13 MED ORDER — LIDOCAINE 2% (20 MG/ML) 5 ML SYRINGE
INTRAMUSCULAR | Status: AC
  Filled 2020-05-13: qty 5

## 2020-05-13 MED ORDER — DEXAMETHASONE SODIUM PHOSPHATE 4 MG/ML IJ SOLN
INTRAMUSCULAR | Status: DC | PRN
  Administered 2020-05-13: 10 mg via INTRAVENOUS

## 2020-05-13 MED ORDER — MIDAZOLAM HCL 2 MG/2ML IJ SOLN
INTRAMUSCULAR | Status: AC
  Filled 2020-05-13: qty 2

## 2020-05-13 MED ORDER — AMISULPRIDE (ANTIEMETIC) 5 MG/2ML IV SOLN: 10.0000 mg | Freq: Once | INTRAVENOUS | Status: DC | PRN

## 2020-05-13 MED ORDER — ONDANSETRON HCL 4 MG/2ML IJ SOLN
INTRAMUSCULAR | Status: AC
  Filled 2020-05-13: qty 2

## 2020-05-13 MED ORDER — CEFAZOLIN SODIUM-DEXTROSE 2-4 GM/100ML-% IV SOLN
INTRAVENOUS | Status: AC
  Filled 2020-05-13: qty 100

## 2020-05-13 MED ORDER — PROPOFOL 10 MG/ML IV BOLUS
INTRAVENOUS | Status: DC | PRN
  Administered 2020-05-13: 50 mg via INTRAVENOUS
  Administered 2020-05-13: 150 mg via INTRAVENOUS

## 2020-05-13 MED ORDER — GLYCOPYRROLATE PF 0.2 MG/ML IJ SOSY
PREFILLED_SYRINGE | INTRAMUSCULAR | Status: AC
  Filled 2020-05-13: qty 1

## 2020-05-13 MED ORDER — LIDOCAINE HCL (CARDIAC) PF 100 MG/5ML IV SOSY
PREFILLED_SYRINGE | INTRAVENOUS | Status: DC | PRN
  Administered 2020-05-13: 60 mg via INTRAVENOUS

## 2020-05-13 MED ORDER — LACTATED RINGERS IV SOLN: INTRAVENOUS | Status: DC

## 2020-05-13 MED ORDER — MIDAZOLAM HCL 5 MG/5ML IJ SOLN
INTRAMUSCULAR | Status: DC | PRN
  Administered 2020-05-13: 2 mg via INTRAVENOUS

## 2020-05-13 MED ORDER — FENTANYL CITRATE (PF) 100 MCG/2ML IJ SOLN
INTRAMUSCULAR | Status: AC
  Filled 2020-05-13: qty 2

## 2020-05-13 MED ORDER — ACETAMINOPHEN 500 MG PO TABS
ORAL_TABLET | ORAL | Status: AC
  Filled 2020-05-13: qty 2

## 2020-05-13 MED ORDER — HYDROCODONE-ACETAMINOPHEN 5-325 MG PO TABS: 1.0000 | ORAL_TABLET | ORAL | 0 refills | Status: AC | PRN

## 2020-05-13 MED ORDER — GLYCOPYRROLATE 0.2 MG/ML IJ SOLN
INTRAMUSCULAR | Status: DC | PRN
  Administered 2020-05-13: .1 mg via INTRAVENOUS

## 2020-05-13 MED ORDER — FENTANYL CITRATE (PF) 100 MCG/2ML IJ SOLN: 25.0000 ug | INTRAMUSCULAR | Status: DC | PRN

## 2020-05-13 MED ORDER — ONDANSETRON HCL 4 MG/2ML IJ SOLN
INTRAMUSCULAR | Status: DC | PRN
  Administered 2020-05-13: 4 mg via INTRAVENOUS

## 2020-05-13 MED ORDER — IOHEXOL 300 MG/ML  SOLN
INTRAMUSCULAR | Status: DC | PRN
  Administered 2020-05-13: 10 mL via URETHRAL

## 2020-05-13 MED ORDER — OXYCODONE HCL 5 MG/5ML PO SOLN
5.0000 mg | Freq: Once | ORAL | Status: DC | PRN
Start: 2020-05-13 — End: 2020-05-13

## 2020-05-13 MED ORDER — PROMETHAZINE HCL 25 MG/ML IJ SOLN: 6.2500 mg | INTRAMUSCULAR | Status: DC | PRN

## 2020-05-13 MED ORDER — KETOROLAC TROMETHAMINE 30 MG/ML IJ SOLN: 30.0000 mg | Freq: Once | INTRAMUSCULAR | Status: DC | PRN

## 2020-05-13 MED ORDER — CEPHALEXIN 500 MG PO CAPS: 500.0000 mg | ORAL_CAPSULE | Freq: Every day | ORAL | 0 refills | Status: AC

## 2020-05-13 MED ORDER — PROPOFOL 10 MG/ML IV BOLUS
INTRAVENOUS | Status: AC
  Filled 2020-05-13: qty 40

## 2020-05-13 MED ORDER — ACETAMINOPHEN 500 MG PO TABS
1000.0000 mg | ORAL_TABLET | Freq: Once | ORAL | Status: AC
  Administered 2020-05-13: 1000 mg via ORAL

## 2020-05-13 MED ORDER — OXYCODONE HCL 5 MG PO TABS: 5.0000 mg | ORAL_TABLET | Freq: Once | ORAL | Status: DC | PRN

## 2020-05-13 SURGICAL SUPPLY — 20 items
BAG DRAIN URO-CYSTO SKYTR STRL (DRAIN) ×2 IMPLANT
BAG DRN UROCATH (DRAIN) ×1
BASKET ZERO TIP NITINOL 2.4FR (BASKET) ×1 IMPLANT
BSKT STON RTRVL ZERO TP 2.4FR (BASKET) ×1
CATH URET 5FR 28IN OPEN ENDED (CATHETERS) ×2 IMPLANT
CLOTH BEACON ORANGE TIMEOUT ST (SAFETY) ×2 IMPLANT
DRSG TEGADERM 4X4.75 (GAUZE/BANDAGES/DRESSINGS) IMPLANT
EXTRACTOR STONE 1.7FRX115CM (UROLOGICAL SUPPLIES) IMPLANT
GLOVE SURG ENC MOIS LTX SZ6.5 (GLOVE) ×3 IMPLANT
GOWN STRL REUS W/TWL LRG LVL3 (GOWN DISPOSABLE) ×3 IMPLANT
GUIDEWIRE STR DUAL SENSOR (WIRE) ×2 IMPLANT
IV NS IRRIG 3000ML ARTHROMATIC (IV SOLUTION) ×3 IMPLANT
KIT TURNOVER CYSTO (KITS) ×2 IMPLANT
MANIFOLD NEPTUNE II (INSTRUMENTS) ×2 IMPLANT
PACK CYSTO (CUSTOM PROCEDURE TRAY) ×2 IMPLANT
STENT URET 6FRX24 CONTOUR (STENTS) ×1 IMPLANT
TRACTIP FLEXIVA PULS ID 200XHI (Laser) IMPLANT
TRACTIP FLEXIVA PULSE ID 200 (Laser) ×2
TUBE CONNECTING 12X1/4 (SUCTIONS) ×2 IMPLANT
TUBING UROLOGY SET (TUBING) ×1 IMPLANT

## 2020-05-13 NOTE — Anesthesia Postprocedure Evaluation (Signed)
Anesthesia Post Note  Patient: Raven Montoya  Procedure(s) Performed: CYSTOSCOPY WITH RETROGRADE PYELOGRAM, URETEROSCOPY AND STENT PLACEMENT (Right Ureter) HOLMIUM LASER APPLICATION (Right Ureter)     Patient location during evaluation: PACU Anesthesia Type: General Level of consciousness: awake Pain management: pain level controlled Vital Signs Assessment: post-procedure vital signs reviewed and stable Respiratory status: spontaneous breathing, nonlabored ventilation, respiratory function stable and patient connected to nasal cannula oxygen Cardiovascular status: blood pressure returned to baseline and stable Postop Assessment: no apparent nausea or vomiting Anesthetic complications: no   No complications documented.  Last Vitals:  Vitals:   05/13/20 1615 05/13/20 1645  BP: 123/81 137/78  Pulse: 92 92  Resp: 20 16  Temp:  36.6 C  SpO2: 96% 96%    Last Pain:  Vitals:   05/13/20 1645  TempSrc:   PainSc: 0-No pain                 Braddock Servellon P Elice Crigger

## 2020-05-13 NOTE — Interval H&P Note (Signed)
History and Physical Interval Note:  05/13/2020 1:45 PM  Raven Montoya  has presented today for surgery, with the diagnosis of RIGHT URETERAL CALCULUS.  The various methods of treatment have been discussed with the patient and family. After consideration of risks, benefits and other options for treatment, the patient has consented to  Procedure(s) with comments: CYSTOSCOPY WITH RETROGRADE PYELOGRAM, URETEROSCOPY AND STENT PLACEMENT (Left) - 1 HR HOLMIUM LASER APPLICATION (Left) as a surgical intervention.  The patient's history has been reviewed, patient examined, no change in status, stable for surgery.  I have reviewed the patient's chart and labs.  Questions were answered to the patient's satisfaction.     Kooper Chriswell D Garvey Westcott

## 2020-05-13 NOTE — Anesthesia Procedure Notes (Signed)
Procedure Name: LMA Insertion Date/Time: 05/13/2020 3:03 PM Performed by: Justice Rocher, CRNA Pre-anesthesia Checklist: Patient identified, Emergency Drugs available, Suction available, Patient being monitored and Timeout performed Patient Re-evaluated:Patient Re-evaluated prior to induction Oxygen Delivery Method: Circle system utilized Preoxygenation: Pre-oxygenation with 100% oxygen Induction Type: IV induction Ventilation: Mask ventilation without difficulty LMA: LMA inserted LMA Size: 4.0 Number of attempts: 1 Airway Equipment and Method: Bite block Placement Confirmation: positive ETCO2,  breath sounds checked- equal and bilateral and CO2 detector Tube secured with: Tape Dental Injury: Teeth and Oropharynx as per pre-operative assessment

## 2020-05-13 NOTE — Transfer of Care (Signed)
Immediate Anesthesia Transfer of Care Note  Patient: Raven Montoya  Procedure(s) Performed: Procedure(s) (LRB): CYSTOSCOPY WITH RETROGRADE PYELOGRAM, URETEROSCOPY AND STENT PLACEMENT (Right) HOLMIUM LASER APPLICATION (Right)  Patient Location: PACU  Anesthesia Type: General  Level of Consciousness: awake, sedated, patient cooperative and responds to stimulation  Airway & Oxygen Therapy: Patient Spontanous Breathing and Patient connected to [place] and soft FM   Post-op Assessment: Report given to PACU RN, Post -op Vital signs reviewed and stable and Patient moving all extremities  Post vital signs: Reviewed and stable  Complications: No apparent anesthesia complications

## 2020-05-13 NOTE — Discharge Instructions (Signed)
DISCHARGE INSTRUCTIONS FOR KIDNEY STONE/URETERAL STENT   MEDICATIONS:  1. Resume all your other meds from home  2. AZO over the counter can be taken to help with the burning/stinging when you urinate. 3. Hydrocodone-acetaminophen is for moderate/severe pain, otherwise taking upto 1000 mg every 6 hours of plainTylenol will help treat your pain.     ACTIVITY:  1. No strenuous activity x 1week  2. No driving while on narcotic pain medications  3. Drink plenty of water  4. Continue to walk at home - you can still get blood clots when you are at home, so keep active, but don't over do it.  5. May return to work/school tomorrow or when you feel ready   BATHING:  1. You can shower and we recommend daily showers  2. You have a string coming from your urethra: The stent string is attached to your ureteral stent. Do not pull on this.   SIGNS/SYMPTOMS TO CALL:  Please call us if you have a fever greater than 101.5, uncontrolled nausea/vomiting, uncontrolled pain, dizziness, unable to urinate, bloody urine, chest pain, shortness of breath, leg swelling, leg pain, redness around wound, drainage from wound, or any other concerns or questions.   You can reach [place] at [phone].   FOLLOW-UP:  1.  Your stent will be removed in clinic in 1-2 weeks.  You will be contacted with the appointment.    Post Anesthesia Home Care Instructions  Activity: Get plenty of rest for the remainder of the day. A responsible individual must stay with you for 24 hours following the procedure.  For the next 24 hours, DO NOT: -Drive a car -Paediatric nurse -Drink alcoholic beverages -Take any medication unless instructed by your physician -Make any legal decisions or sign important papers.  Meals: Start with liquid foods such as gelatin or soup. Progress to regular foods as tolerated. Avoid greasy, spicy, heavy foods. If nausea and/or vomiting occur, drink only clear liquids until the nausea and/or vomiting  subsides. Call your physician if vomiting continues.  Special Instructions/Symptoms: Your throat may feel dry or sore from the anesthesia or the breathing tube placed in your throat during surgery. If this causes discomfort, gargle with warm salt water. The discomfort should disappear within 24 hours.  If you had a scopolamine patch placed behind your ear for the management of post- operative nausea and/or vomiting:  1. The medication in the patch is effective for 72 hours, after which it should be removed.  Wrap patch in a tissue and discard in the trash. Wash hands thoroughly with soap and water. 2. You may remove the patch earlier than 72 hours if you experience unpleasant side effects which may include dry mouth, dizziness or visual disturbances. 3. Avoid touching the patch. Wash your hands with soap and water after contact with the patch.     Post Anesthesia Home Care Instructions  Activity: Get plenty of rest for the remainder of the day. A responsible individual must stay with you for 24 hours following the procedure.  For the next 24 hours, DO NOT: -Drive a car -Paediatric nurse -Drink alcoholic beverages -Take any medication unless instructed by your physician -Make any legal decisions or sign important papers.  Meals: Start with liquid foods such as gelatin or soup. Progress to regular foods as tolerated. Avoid greasy, spicy, heavy foods. If nausea and/or vomiting occur, drink only clear liquids until the nausea and/or vomiting subsides. Call your physician if vomiting continues.  Special Instructions/Symptoms: Your throat may  feel dry or sore from the anesthesia or the breathing tube placed in your throat during surgery. If this causes discomfort, gargle with warm salt water. The discomfort should disappear within 24 hours.

## 2020-05-13 NOTE — Anesthesia Preprocedure Evaluation (Addendum)
Anesthesia Evaluation  Patient identified by MRN, date of birth, ID band Patient awake    Reviewed: Allergy & Precautions, NPO status , Patient's Chart, lab work & pertinent test results  Airway Mallampati: II  TM Distance: >3 FB Neck ROM: Full    Dental no notable dental hx.    Pulmonary neg pulmonary ROS,    Pulmonary exam normal breath sounds clear to auscultation       Cardiovascular negative cardio ROS Normal cardiovascular exam Rhythm:Regular Rate:Normal     Neuro/Psych PSYCHIATRIC DISORDERS Anxiety Depression Multiple sclerosis    GI/Hepatic Neg liver ROS, hiatal hernia,   Endo/Other  negative endocrine ROS  Renal/GU negative Renal ROS     Musculoskeletal  (+) Arthritis ,   Abdominal (+) + obese,   Peds  (+) ATTENTION DEFICIT DISORDER WITHOUT HYPERACTIVITY Hematology negative hematology ROS (+)   Anesthesia Other Findings RIGHT URETERAL CALCULUS  Reproductive/Obstetrics                            Anesthesia Physical Anesthesia Plan  ASA: II  Anesthesia Plan: General   Post-op Pain Management:    Induction: Intravenous  PONV Risk Score and Plan: 4 or greater and Ondansetron, Dexamethasone, Midazolam and Treatment may vary due to age or medical condition  Airway Management Planned: LMA  Additional Equipment:   Intra-op Plan:   Post-operative Plan: Extubation in OR  Informed Consent: I have reviewed the patients History and Physical, chart, labs and discussed the procedure including the risks, benefits and alternatives for the proposed anesthesia with the patient or authorized representative who has indicated his/her understanding and acceptance.     Dental advisory given  Plan Discussed with: CRNA  Anesthesia Plan Comments:        Anesthesia Quick Evaluation

## 2020-05-13 NOTE — Op Note (Signed)
Preoperative diagnosis: right ureteral calculus  Postoperative diagnosis: right ureteral calculus  Procedure:  1. Cystoscopy 2. right ureteroscopy, laser lithotripsy, basket stone extraction 3. right 69F x 26 ureteral stent placement  4. right retrograde pyelography with interpretation  Surgeon: Jacalyn Lefevre, MD  Anesthesia: General  Complications: None  Intraoperative findings: right retrograde pyelography demonstrated a filling defect within the right ureter consistent with the patient's known calculus without other abnormalities; tortuosity of proximal ureter and hydronephrosis proximal to stone.  EBL: Minimal  Specimens: 1. right ureteral calculus  Disposition of specimens: [facility] for stone analysis  Indication: Raven Montoya is a 60 y.o.   patient with a 7 x 3 mm right ureteral stone and associated right symptoms. After reviewing the management options for treatment, the patient elected to proceed with the above surgical procedure(s). We have discussed the potential benefits and risks of the procedure, side effects of the proposed treatment, the likelihood of the patient achieving the goals of the procedure, and any potential problems that might occur during the procedure or recuperation. Informed consent has been obtained.   Description of procedure:  The patient was taken to the operating room and general anesthesia was induced.  The patient was placed in the dorsal lithotomy position, prepped and draped in the usual sterile fashion, and preoperative antibiotics were administered. A preoperative time-out was performed.   Cystourethroscopy was performed.  The patient's urethra was examined and was normal.  The bladder was then systematically examined in its entirety. There was no evidence for any bladder tumors, stones, or other mucosal pathology.    Attention then turned to the right ureteral orifice and a ureteral catheter was used to intubate the  ureteral orifice.  Omnipaque contrast was injected through the ureteral catheter and a retrograde pyelogram was performed with findings as dictated above.  A 0.38 sensor guidewire was then advanced up the right ureter into the renal pelvis under fluoroscopic guidance. The 4.5 Fr semirigid ureteroscope was then advanced into the ureter next to the guidewire and the calculus was identified.   The stone was then fragmented with the 200 micron holmium laser fiber.All stones were then removed from the ureter with a 0 tip basket.  Reinspection of the ureter revealed no remaining visible stones or fragments.   The wire was then backloaded through the cystoscope and a ureteral stent was advance over the wire using Seldinger technique.  The stent was positioned appropriately under fluoroscopic and cystoscopic guidance.  The wire was then removed with an adequate stent curl noted in the renal pelvis as well as in the bladder.  The bladder was then emptied and the procedure ended.  The patient appeared to tolerate the procedure well and without complications.  The patient was able to be awakened and transferred to the recovery unit in satisfactory condition.   Disposition: The tether of the stent was removed and the stent will be removed in the office in 1-2 weeks.

## 2020-05-14 ENCOUNTER — Encounter (HOSPITAL_BASED_OUTPATIENT_CLINIC_OR_DEPARTMENT_OTHER): Payer: Self-pay | Admitting: Urology

## 2020-05-28 ENCOUNTER — Other Ambulatory Visit: Payer: Self-pay | Admitting: Family Medicine

## 2020-05-28 MED ORDER — AMPHETAMINE-DEXTROAMPHET ER 30 MG PO CP24: 30.0000 mg | ORAL_CAPSULE | Freq: Every day | ORAL | 0 refills | Status: DC

## 2020-07-01 ENCOUNTER — Other Ambulatory Visit: Payer: Self-pay | Admitting: Family Medicine

## 2020-07-01 MED ORDER — AMPHETAMINE-DEXTROAMPHET ER 30 MG PO CP24: 30.0000 mg | ORAL_CAPSULE | Freq: Every day | ORAL | 0 refills | Status: DC

## 2020-07-19 ENCOUNTER — Other Ambulatory Visit: Payer: Self-pay | Admitting: Neurology

## 2020-07-19 ENCOUNTER — Encounter: Payer: Self-pay | Admitting: Family Medicine

## 2020-07-31 ENCOUNTER — Telehealth: Payer: Self-pay | Admitting: *Deleted

## 2020-07-31 NOTE — Telephone Encounter (Signed)
PA approved from 07/31/20 through 07/31/2021. PA#Dem[place]olis Humphreys [phone] KJ.

## 2020-07-31 NOTE — Telephone Encounter (Signed)
Faxed completed/signed PA Gilenya back to [employer] at [phone]. Received fax confirmation, waiting on determination.

## 2020-08-21 ENCOUNTER — Ambulatory Visit: Payer: BC Managed Care – PPO | Admitting: Neurology

## 2020-08-21 ENCOUNTER — Encounter: Payer: Self-pay | Admitting: Neurology

## 2020-08-21 VITALS — BP 137/77 | HR 81 | Ht 64.0 in | Wt 221.5 lb

## 2020-08-21 DIAGNOSIS — Z79899 Other long term (current) drug therapy: Secondary | ICD-10-CM | POA: Diagnosis not present

## 2020-08-21 DIAGNOSIS — G35 Multiple sclerosis: Secondary | ICD-10-CM

## 2020-08-21 DIAGNOSIS — R269 Unspecified abnormalities of gait and mobility: Secondary | ICD-10-CM

## 2020-08-21 DIAGNOSIS — F988 Other specified behavioral and emotional disorders with onset usually occurring in childhood and adolescence: Secondary | ICD-10-CM

## 2020-08-21 DIAGNOSIS — R7989 Other specified abnormal findings of blood chemistry: Secondary | ICD-10-CM | POA: Insufficient documentation

## 2020-08-21 DIAGNOSIS — M542 Cervicalgia: Secondary | ICD-10-CM

## 2020-08-21 MED ORDER — AMPHETAMINE-DEXTROAMPHET ER 30 MG PO CP24: 30.0000 mg | ORAL_CAPSULE | Freq: Every day | ORAL | 0 refills | Status: DC

## 2020-08-21 NOTE — Progress Notes (Signed)
[facility]  PATIENT: Raven Montoya DOB: [birth year]  REFERRING CLINICIAN: Anastasia Pall HISTORY FROM: Patient  REASON FOR VISIT: MS   HISTORICAL  CHIEF COMPLAINT:  Chief Complaint  Patient presents with   Follow-up    Rm 2, alone. Here for 6 month MS f/u. On Gilenya.     HISTORY OF PRESENT ILLNESS:  Raven Montoya is a 60 y.o. woman with relapsing remitting MS.      Update 08/21/2020: She is on Gilenya and tolerates it well.   No exacerbations.   Her 01/13/18 MRI was unchanged compared to 2018.   Her gait is mildly off balanced she could walk a mile without stopping.   She will use a bannister on stairs.  She has mild left leg weakness and spasticity associated with pain.   Spasms and cramps occur at night when she has been more active but not most nights.  Some will wake her up.   Her bladder has done better since starting oxybutynin.    She had stones removed.   Vision is stable and she has recent examination.    She has fatigue.  Adderall has helped her attention deficit nd fatigue.    She has depression and we recently switched her from Zoloft to Cymbalta.She rarely has nights that she can't sleep.  She is noting more neck pain that will cause pain and headache.   ROM is mildly reduced in her neck.   Meloxicam was stopped due to elevated creatinine.      MS History:   She was diagnosed with multiple sclerosis at age 21 after presenting with optic neuritis in the left eye. Her neurologist discussed with her that she probably had MS but no medication was started at that time. Vision got better over the next few weeks. A couple years later, she went to see Dr. Effie Shy. He felt that the MS should be treated and she was started on Rebif. She remain on Rebif for many years   She had not had any major exacerbations while on Rebif but had needles fatigue.    In 2014, she switched to [place]. She tolerates Gilenya well.   Imaging: MRI of the brain 01/11/2018  shows T2/FLAIR hyperintense foci in the periventricular, juxtacortical and deep white matter.  There were no new lesions.  No change compared to 01/25/2017.  MRI of the cervical spine 01/11/2018 showed a subtle focus to the left at C4.Marland Kitchen  She had multilevel degenerative changes with a disc protrusion at C2-C3 towards the left, disc protrusion, posterior longitudinal ligament hypertrophy and facet hypertrophy at C3-C4 and C4-C5.  There is mild spinal stenosis at those levels.  MRI of the brain 01/25/2017 showed Multiple T2/FLAIR hyperintense foci in the periventricular, juxtacortical and deep white matter of both hemispheres in a pattern and configuration consistent with chronic demyelinating plaque associated with multiple sclerosis. None of the foci appears to be acute and there is no change when compared to the 02/12/2015 MRI.  REVIEW OF SYSTEMS:  Constitutional: No fevers, chills, sweats, or change in appetite.   She has fatigue and mild insomnia (helped by Ambien) Eyes: No visual changes, double vision, eye pain Ear, nose and throat: No hearing loss, ear pain, nasal congestion, sore throat Cardiovascular: No chest pain, palpitations Respiratory:  No shortness of breath at rest or with exertion.   No wheezes GastrointestinaI: No nausea, vomiting, diarrhea, abdominal pain, fecal incontinence Genitourinary:  No dysuria, urinary retention or frequency.  No nocturia. Musculoskeletal:  Mild neck pain, no major back pain Integumentary: No rash, pruritus, skin lesions Neurological: as above Psychiatric: as above.    Endocrine: No palpitations, diaphoresis, change in appetite, change in weigh or increased thirst Hematologic/Lymphatic:  No anemia, purpura, petechiae. Allergic/Immunologic: No itchy/runny eyes, nasal congestion, recent allergic reactions, rashes  ALLERGIES: No Known Allergies   HOME MEDICATIONS: Outpatient Medications Prior to Visit  Medication Sig Dispense Refill   CALCIUM  CARBONATE PO Take 250 mg by mouth daily.      cholecalciferol (VITAMIN D) 1000 units tablet Take 1,000 Units by mouth daily.      cyclobenzaprine (FLEXERIL) 10 MG tablet Take 1 tablet (10 mg total) by mouth at bedtime as needed. 90 tablet 3   DULoxetine (CYMBALTA) 60 MG capsule Take 1 capsule by mouth once daily 90 capsule 1   Fingolimod HCl (GILENYA) 0.5 MG CAPS TAKE ONE CAPSULE BY MOUTH ONCE DAILY. MAY TAKE WITH OR WITHOUT FOOD. STORE AT ROOM TEMPERATURE. (Patient taking differently: at bedtime. TAKE ONE CAPSULE BY MOUTH ONCE DAILY. MAY TAKE WITH OR WITHOUT FOOD. STORE AT ROOM TEMPERATURE.) 90 capsule 3   HYDROcodone-acetaminophen (NORCO/VICODIN) 5-325 MG tablet TAKE 1 TO 2 TABLETS BY MOUTH THREE TIMES DAILY AS NEEDED     HYDROcodone-acetaminophen (NORCO/VICODIN) 5-325 MG tablet Take 1 tablet by mouth every 4 (four) hours as needed for moderate pain. 20 tablet 0   IRON PO Take 1 tablet by mouth 2 (two) times a week. occ     letrozole (FEMARA) 2.5 MG tablet Take 1 tablet (2.5 mg total) by mouth daily. (Patient taking differently: Take 2.5 mg by mouth at bedtime.) 90 tablet 3   Multiple Vitamin (MULTIVITAMIN) tablet Take 1 tablet by mouth daily.     valACYclovir (VALTREX) 1000 MG tablet Take 0.5 tablets (500 mg total) by mouth daily. (Patient taking differently: Take 500 mg by mouth daily as needed. In summer prn for fever blisters) 30 tablet 12   vitamin B-12 (CYANOCOBALAMIN) 1000 MCG tablet Take 1,000 mcg by mouth daily.      zolpidem (AMBIEN) 10 MG tablet 5 mg at bedtime.     amphetamine-dextroamphetamine (ADDERALL XR) 30 MG 24 hr capsule Take 1 capsule (30 mg total) by mouth daily. 30 capsule 0   Facility-Administered Medications Prior to Visit  Medication Dose Route Frequency Provider Last Rate Last Admin   gadopentetate dimeglumine (MAGNEVIST) injection 20 mL  20 mL Intravenous Once PRN Shermon Bozzi, Nanine Means, MD       gadopentetate dimeglumine (MAGNEVIST) injection 20 mL  20 mL Intravenous Once  PRN Xiomar Crompton, Nanine Means, MD        PAST MEDICAL HISTORY: Past Medical History:  Diagnosis Date   Anemia    Anxiety    Arthritis    oa   Cancer ([place]) 2018   right breast   Depression    Fractured rib hx of 2018   healed   History of hiatal hernia    small per ct per pt   History of kidney stones yrs ago   passed on own   History of radiation therapy 06/02/16-07/14/16   right breast 50.4 Gy in 28 fractions   Memory loss    occ   Multiple sclerosis ([place])    age 57   Shingles 2016   back   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   Urinary urgency    Wears glasses    Wears glasses     PAST SURGICAL HISTORY: Past Surgical History:  Procedure Laterality Date   ADENOIDECTOMY     BREAST LUMPECTOMY WITH RADIOACTIVE SEED AND SENTINEL LYMPH NODE BIOPSY Right 03/25/2016   Procedure: RIGHT BREAST SEED GUIDED PARTIAL MASTECTOMY WITH RIGHT SENITINEL LYMPH NODE MAPPING;  Surgeon: Erroll Luna, MD;  Location: [facility];  Service: General;  Laterality: Right;   BREAST RECONSTRUCTION Right 03/25/2016   Procedure: RIGHT BREAST RECONSTRUCTION;  Surgeon: Loel Lofty Dillingham, DO;  Location: [facility];  Service: Plastics;  Laterality: Right;   BREAST REDUCTION SURGERY Right 03/25/2016   Procedure: MAMMARY REDUCTION  (BREAST) RIGHT;  Surgeon: Loel Lofty Dillingham, DO;  Location: [place] [facility];  Service: Plastics;  Laterality: Right;   BREAST REDUCTION SURGERY Left 01/06/2017   Procedure: LEFT MAMMARY REDUCTION  (BREAST) FOR SYMMETRY WITH LIPOSUCTION;  Surgeon: Wallace Going, DO;  Location: [place];  Service: Plastics;  Laterality: Left;   BREAST SURGERY Left 01/25/2018   breast reduction   BUNIONECTOMY Bilateral 2011   pins in both big toes   CESAREAN SECTION     x 1   COLONOSCOPY W/ BIOPSIES AND POLYPECTOMY  2018 and 2021   f/u 5 yrs now   [place], URETEROSCOPY AND STENT PLACEMENT Right 05/13/2020   Procedure: CYSTOSCOPY WITH RETROGRADE PYELOGRAM,  URETEROSCOPY AND STENT PLACEMENT;  Surgeon: Robley Fries, MD;  Location: [facility];  Service: Urology;  Laterality: Right;  1 HR   HOLMIUM LASER APPLICATION Right 07/10/3760   Procedure: HOLMIUM LASER APPLICATION;  Surgeon: Robley Fries, MD;  Location: [facility];  Service: Urology;  Laterality: Right;   INCISION AND DRAINAGE OF WOUND Right 04/21/2016   Procedure: IRRIGATION AND DEBRIDEMENT RIGHT BREAST WOUND;  Surgeon: Wallace Going, DO;  Location: [place];  Service: Plastics;  Laterality: Right;   MASS EXCISION Left 02/24/2017   Procedure: LEFT BREAST WOUND EXCISION AND CLOSURE WITH ACELL APPLICATION;  Surgeon: Wallace Going, DO;  Location: [place];  Service: Plastics;  Laterality: Left;   TONSILLECTOMY  age 48   and adenoids    FAMILY HISTORY: Family History  Problem Relation Age of Onset   Osteoporosis Mother    Heart disease Mother    Stroke Mother    COPD Mother    Cancer Father        colon   Heart disease Brother    Heart attack Brother     SOCIAL HISTORY:  Social History   Socioeconomic History   Marital status: Divorced    Spouse name: Not on file   Number of children: 1   Years of education: Not on file   Highest education level: Not on file  Occupational History   Occupation: Recruitment consultant  Tobacco Use   Smoking status: Never   Smokeless tobacco: Never  Vaping Use   Vaping Use: Never used  Substance and Sexual Activity   Alcohol use: Yes    Comment: rare   Drug use: No   Sexual activity: Not Currently    Partners: Male    Birth control/protection: Condom    Comment: partner has had vasectomy  Other Topics Concern   Not on file  Social History Narrative   Not on file   Social Determinants of Health   Financial Resource Strain: Not on file  Food Insecurity: Not on file  Transportation Needs: Not on file  Physical Activity: Not on file  Stress: Not on file  Social  Connections: Not on file  Intimate Partner  Violence: Not on file     PHYSICAL EXAM  Vitals:   08/21/20 1121  BP: 137/77  Pulse: 81  Weight: 221 lb 8 oz (100.5 kg)  Height: 5\' 4"  (1.626 m)    Body mass index is 38.02 kg/m.   General: The patient is well-developed and well-nourished and in no acute distress  Musculoskeletal:   She has mild cervical tenderness on the left and the trapezius muscle.  Mild reduced range of motion of the neck.  Neurologic Exam  Mental status: The patient is alert and oriented x 3 at the time of the examination. The patient has apparent normal recent and remote memory, with an apparently normal attention span and concentration ability.   Speech is normal.  Cranial nerves: Extraocular movements are full.  Facial strength and sensation is normal.  Trapezius strength is normal.  Hearing is normal.  Motor:  Muscle bulk and tone are normal. Strength is  5 / 5 in all 4 extremities.   Sensory: Intact sensation to touch and vibration in the arms and legs.  Coordination: Finger-nose-finger and heel-to-shin was performed well bilaterally.  Gait and station: Station and gait are normal.  Tandem gait is mildly wide  Romberg is negative.  Reflexes: Deep tendon reflexes are symmetric and brisk bilaterally but no ankle clonus.   Multiple sclerosis ([place]) - Plan: MR CERVICAL SPINE WO CONTRAST, MR BRAIN WO CONTRAST  Gait disturbance - Plan: MR CERVICAL SPINE WO CONTRAST, MR BRAIN WO CONTRAST  Attention deficit disorder, unspecified hyperactivity presence  High risk medication use  Neck pain  Abnormal blood creatinine level   1.   Continue Gilenya.  We will check CBC with differential and CMP.. 2.   Continue Cymbalta 60 mg.  Continue Adderall.  I will send a refill today. 3.   Stay active and exercise as tolerated. 4.   Continue as needed Flexeril for spasticity.   Due to elevated creatinine, avoid NSAIDs.  5.    Return in 6 months or sooner if there  are new or worsening neurologic symptoms.  Twylah Bennetts A. Felecia Shelling, MD, PhD 5/83/0940, 76:80 AM Certified in Neurology, Clinical Neurophysiology, Sleep Medicine, Pain Medicine and Neuroimaging  [facility] [address], [place], [place] [address] [phone]

## 2020-08-22 LAB — COMPREHENSIVE METABOLIC PANEL
ALT: 20 IU/L (ref 0–32)
AST: 21 IU/L (ref 0–40)
Albumin/Globulin Ratio: 2.4 — ABNORMAL HIGH (ref 1.2–2.2)
Albumin: 4.4 g/dL (ref 3.8–4.9)
Alkaline Phosphatase: 114 IU/L (ref 44–121)
BUN/Creatinine Ratio: 19 (ref 9–23)
BUN: 21 mg/dL (ref 6–24)
Bilirubin Total: 0.5 mg/dL (ref 0.0–1.2)
CO2: 27 mmol/L (ref 20–29)
Calcium: 9.3 mg/dL (ref 8.7–10.2)
Chloride: 100 mmol/L (ref 96–106)
Creatinine, Ser: 1.09 mg/dL — ABNORMAL HIGH (ref 0.57–1.00)
Globulin, Total: 1.8 g/dL (ref 1.5–4.5)
Glucose: 116 mg/dL — ABNORMAL HIGH (ref 65–99)
Potassium: 4.2 mmol/L (ref 3.5–5.2)
Sodium: 142 mmol/L (ref 134–144)
Total Protein: 6.2 g/dL (ref 6.0–8.5)
eGFR: 59 mL/min/{1.73_m2} — ABNORMAL LOW (ref >59)

## 2020-08-22 LAB — CBC WITH DIFFERENTIAL/PLATELET
Basophils Absolute: 0 10*3/uL (ref 0.0–0.2)
Basos: 0 %
EOS (ABSOLUTE): 0.3 10*3/uL (ref 0.0–0.4)
Eos: 8 %
Hematocrit: 41.8 % (ref 34.0–46.6)
Hemoglobin: 13.1 g/dL (ref 11.1–15.9)
Immature Grans (Abs): 0 10*3/uL (ref 0.0–0.1)
Immature Granulocytes: 1 %
Lymphocytes Absolute: 0.4 10*3/uL — ABNORMAL LOW (ref 0.7–3.1)
Lymphs: 9 %
MCH: 29.6 pg (ref 26.6–33.0)
MCHC: 31.3 g/dL — ABNORMAL LOW (ref 31.5–35.7)
MCV: 94 fL (ref 79–97)
Monocytes Absolute: 0.4 10*3/uL (ref 0.1–0.9)
Monocytes: 11 %
Neutrophils Absolute: 2.8 10*3/uL (ref 1.4–7.0)
Neutrophils: 71 %
Platelets: 208 10*3/uL (ref 150–450)
RBC: 4.43 x10E6/uL (ref 3.77–5.28)
RDW: 13.2 % (ref 11.7–15.4)
WBC: 3.9 10*3/uL (ref 3.4–10.8)

## 2020-09-04 ENCOUNTER — Telehealth: Payer: Self-pay | Admitting: Neurology

## 2020-09-04 NOTE — Telephone Encounter (Signed)
MRIs are ready to be scheduled at [facility]. I tried to call but she does not have a VM. Sent mychart message asking her to call me.

## 2020-09-25 NOTE — Telephone Encounter (Signed)
Patient returned my call and stated she is getting medicare in sept and will call back with that information and then get the MRI scheduled.

## 2020-10-27 ENCOUNTER — Telehealth: Payer: Self-pay | Admitting: Neurology

## 2020-10-27 DIAGNOSIS — G35 Multiple sclerosis: Secondary | ICD-10-CM

## 2020-10-27 MED ORDER — GILENYA 0.5 MG PO CAPS: ORAL_CAPSULE | ORAL | 3 refills | Status: DC

## 2020-10-27 NOTE — Telephone Encounter (Signed)
E-scribed refill as requested. 

## 2020-10-27 NOTE — Telephone Encounter (Signed)
Raven Montoya @ [facility] has called for a refill on pt's Fingolimod HCl (GILENYA) 0.5 MG CAPS

## 2020-11-04 ENCOUNTER — Other Ambulatory Visit: Payer: Self-pay | Admitting: *Deleted

## 2020-11-04 MED ORDER — DULOXETINE HCL 60 MG PO CPEP
60.0000 mg | ORAL_CAPSULE | Freq: Every day | ORAL | 1 refills | Status: DC
Start: 2020-11-04 — End: 2021-05-25

## 2020-11-10 ENCOUNTER — Telehealth: Payer: Self-pay | Admitting: Neurology

## 2020-11-10 NOTE — Telephone Encounter (Signed)
Called and spoke with Crystal at [facility]. They have prescription sent 10/27/20. No refill needed. They are going to reach out to pt to get consent prior to shipping. Nothing further needed.

## 2020-11-10 NOTE — Telephone Encounter (Signed)
[name]) request refill for Fingolimod HCl (GILENYA) 0.5 MG CAPS

## 2020-11-24 NOTE — Telephone Encounter (Signed)
Patient called me and informed me she has [employer].  Mcarthur Rossetti Josem Kaufmann: [phone] (exp. 11/24/20 to 12/24/20) order sent to GI, they will reach out to the patient to schedule.

## 2020-12-09 ENCOUNTER — Other Ambulatory Visit: Payer: Self-pay | Admitting: *Deleted

## 2020-12-09 DIAGNOSIS — G35 Multiple sclerosis: Secondary | ICD-10-CM

## 2020-12-09 MED ORDER — FINGOLIMOD HCL 0.5 MG PO CAPS: ORAL_CAPSULE | ORAL | 3 refills | Status: DC

## 2020-12-11 ENCOUNTER — Telehealth: Payer: Self-pay | Admitting: Neurology

## 2020-12-11 NOTE — Telephone Encounter (Signed)
Raven Montoya completed the form and faxed to the [place] program

## 2020-12-11 NOTE — Telephone Encounter (Signed)
Keta from [facility] is asking for an emergency bridge of a 14 day Rx for [facility] for pt.  She is down to 3 pills. Their ph# [phone]

## 2020-12-16 ENCOUNTER — Other Ambulatory Visit: Payer: Self-pay

## 2020-12-16 ENCOUNTER — Ambulatory Visit
Admission: RE | Admit: 2020-12-16 | Discharge: 2020-12-16 | Disposition: A | Discharge status: Home / Self Care | Payer: Medicare HMO | Source: Ambulatory Visit | Attending: Neurology | Admitting: Neurology

## 2020-12-16 DIAGNOSIS — R269 Unspecified abnormalities of gait and mobility: Secondary | ICD-10-CM

## 2020-12-16 DIAGNOSIS — G35 Multiple sclerosis: Secondary | ICD-10-CM

## 2020-12-23 ENCOUNTER — Encounter: Payer: Self-pay | Admitting: *Deleted

## 2021-01-06 ENCOUNTER — Encounter: Payer: Self-pay | Admitting: Family Medicine

## 2021-01-06 ENCOUNTER — Other Ambulatory Visit: Payer: Self-pay | Admitting: Neurology

## 2021-01-06 MED ORDER — AMPHETAMINE-DEXTROAMPHET ER 30 MG PO CP24: 30.0000 mg | ORAL_CAPSULE | Freq: Every day | ORAL | 0 refills | Status: DC

## 2021-01-06 NOTE — Telephone Encounter (Signed)
Received refill request for Adderall XR 30mg .  Last OV was on 08/21/20.  Next OV is scheduled for 02/23/21 .  Last RX was written on 08/21/20 for 30 tabs.   [place] [facility] Database has been reviewed.

## 2021-01-26 ENCOUNTER — Encounter: Payer: Self-pay | Admitting: Family Medicine

## 2021-01-26 ENCOUNTER — Other Ambulatory Visit: Payer: Self-pay | Admitting: *Deleted

## 2021-01-26 ENCOUNTER — Other Ambulatory Visit: Payer: Self-pay | Admitting: Neurology

## 2021-01-26 MED ORDER — AMPHETAMINE-DEXTROAMPHET ER 30 MG PO CP24: 30.0000 mg | ORAL_CAPSULE | Freq: Every day | ORAL | 0 refills | Status: DC

## 2021-02-12 ENCOUNTER — Encounter: Payer: Self-pay | Admitting: Neurology

## 2021-02-12 ENCOUNTER — Other Ambulatory Visit: Payer: Self-pay | Admitting: *Deleted

## 2021-02-12 MED ORDER — CYCLOBENZAPRINE HCL 10 MG PO TABS: 10.0000 mg | ORAL_TABLET | Freq: Every evening | ORAL | 3 refills | Status: DC | PRN

## 2021-02-16 ENCOUNTER — Encounter: Payer: Self-pay | Admitting: Neurology

## 2021-02-19 NOTE — Telephone Encounter (Signed)
Received email from [facility] asking for rx Gilenya to be sent to fax: [phone] for NPAF. I faxed and received confirmation. I emailed her back letting her know this request has been completed.

## 2021-02-23 ENCOUNTER — Other Ambulatory Visit: Payer: Self-pay

## 2021-02-23 ENCOUNTER — Encounter: Payer: Self-pay | Admitting: Neurology

## 2021-02-23 ENCOUNTER — Ambulatory Visit: Payer: Medicare HMO | Admitting: Neurology

## 2021-02-23 ENCOUNTER — Other Ambulatory Visit: Payer: Self-pay | Admitting: *Deleted

## 2021-02-23 VITALS — BP 158/79 | HR 76 | Ht 64.0 in | Wt 228.8 lb

## 2021-02-23 DIAGNOSIS — E559 Vitamin D deficiency, unspecified: Secondary | ICD-10-CM

## 2021-02-23 DIAGNOSIS — F418 Other specified anxiety disorders: Secondary | ICD-10-CM

## 2021-02-23 DIAGNOSIS — R269 Unspecified abnormalities of gait and mobility: Secondary | ICD-10-CM | POA: Diagnosis not present

## 2021-02-23 DIAGNOSIS — G35 Multiple sclerosis: Secondary | ICD-10-CM | POA: Diagnosis not present

## 2021-02-23 DIAGNOSIS — F988 Other specified behavioral and emotional disorders with onset usually occurring in childhood and adolescence: Secondary | ICD-10-CM | POA: Diagnosis not present

## 2021-02-23 DIAGNOSIS — M542 Cervicalgia: Secondary | ICD-10-CM | POA: Diagnosis not present

## 2021-02-23 DIAGNOSIS — F321 Major depressive disorder, single episode, moderate: Secondary | ICD-10-CM

## 2021-02-23 DIAGNOSIS — Z79899 Other long term (current) drug therapy: Secondary | ICD-10-CM

## 2021-02-23 MED ORDER — ARIPIPRAZOLE 5 MG PO TABS: 5.0000 mg | ORAL_TABLET | Freq: Every day | ORAL | 5 refills | Status: DC

## 2021-02-23 MED ORDER — AMPHETAMINE-DEXTROAMPHET ER 30 MG PO CP24: 30.0000 mg | ORAL_CAPSULE | Freq: Every day | ORAL | 0 refills | Status: DC

## 2021-02-23 MED ORDER — AMPHETAMINE-DEXTROAMPHET ER 30 MG PO CP24
30.0000 mg | ORAL_CAPSULE | Freq: Every day | ORAL | 0 refills | Status: DC
Start: 2021-02-23 — End: 2021-02-23

## 2021-02-23 NOTE — Progress Notes (Signed)
[facility]  PATIENT: Raven Montoya DOB: [birth year]  REFERRING CLINICIAN: Anastasia Pall HISTORY FROM: Patient  REASON FOR VISIT: MS   HISTORICAL  CHIEF COMPLAINT:  Chief Complaint  Patient presents with   Follow-up    Room 16. Pt alone.    HISTORY OF PRESENT ILLNESS:  Raven Montoya is a 61 y.o. woman with relapsing remitting MS.      Update 02/23/2021: She is on Gilenya and tolerates it well.   However, due to insurance issues, enrolling in an assistance problem and pharmacy issues, she has been off x 3 weeks.    She felt in September that her right leg was weaker x 6 weeks - she noted difficulty getting into a car.     Her 12/16/2020 MRI showed no changes compared to the 01/13/18 MRI.      She has had worse depression the last couple weeks though notes no new stressors in life.    She is on duloxetine 60 mg.  She also feels more nervous as well.  She feels more scatter brained since this started.  She has not seen behavioral health.    She has fatigue.  Adderall has helped her attention deficit nd fatigue.   Recently she is sleeping worse the past month (sleep maintenance issues).  Her gait is mildly off balanced she could walk a mile without stopping.   She will use a bannister on stairs.  She has mild left leg weakness and spasticity associated with pain.   Spasms and cramps occur at night when she has been more active but not most nights.  Some will wake her up.   Her bladder has done better since starting oxybutynin.    She had stones removed.   Vision is stable and she has recent examination.   ' She is noting neck pain helped by PT through Ortho with benefit.  Marland Kitchen   ROM is mildly reduced in her neck.   Meloxicam was stopped due to elevated creatinine.      MS History:   She was diagnosed with multiple sclerosis at age 71 after presenting with optic neuritis in the left eye. Her neurologist discussed with her that she probably had MS but no medication was  started at that time. Vision got better over the next few weeks. A couple years later, she went to see Dr. Effie Shy. He felt that the MS should be treated and she was started on Rebif. She remain on Rebif for many years   She had not had any major exacerbations while on Rebif but had needles fatigue.    In 2014, she switched to [place]. She tolerates Gilenya well.   Imaging: MRI of the brain 01/11/2018 shows T2/FLAIR hyperintense foci in the periventricular, juxtacortical and deep white matter.  There were no new lesions.  No change compared to 01/25/2017.  MRI of the cervical spine 01/11/2018 showed a subtle focus to the left at C4.Marland Kitchen  She had multilevel degenerative changes with a disc protrusion at C2-C3 towards the left, disc protrusion, posterior longitudinal ligament hypertrophy and facet hypertrophy at C3-C4 and C4-C5.  There is mild spinal stenosis at those levels.  MRI of the brain 01/25/2017 showed Multiple T2/FLAIR hyperintense foci in the periventricular, juxtacortical and deep white matter of both hemispheres in a pattern and configuration consistent with chronic demyelinating plaque associated with multiple sclerosis. None of the foci appears to be acute and there is no change when compared to the 02/12/2015 MRI.  REVIEW OF SYSTEMS:  Constitutional: No fevers, chills, sweats, or change in appetite.   She has fatigue and mild insomnia (helped by Ambien) Eyes: No visual changes, double vision, eye pain Ear, nose and throat: No hearing loss, ear pain, nasal congestion, sore throat Cardiovascular: No chest pain, palpitations Respiratory:  No shortness of breath at rest or with exertion.   No wheezes GastrointestinaI: No nausea, vomiting, diarrhea, abdominal pain, fecal incontinence Genitourinary:  No dysuria, urinary retention or frequency.  No nocturia. Musculoskeletal:  Mild neck pain, no major back pain Integumentary: No rash, pruritus, skin lesions Neurological: as  above Psychiatric: as above.    Endocrine: No palpitations, diaphoresis, change in appetite, change in weigh or increased thirst Hematologic/Lymphatic:  No anemia, purpura, petechiae. Allergic/Immunologic: No itchy/runny eyes, nasal congestion, recent allergic reactions, rashes  ALLERGIES: No Known Allergies   HOME MEDICATIONS: Outpatient Medications Prior to Visit  Medication Sig Dispense Refill   CALCIUM CARBONATE PO Take 250 mg by mouth daily.      cholecalciferol (VITAMIN D) 1000 units tablet Take 1,000 Units by mouth daily.      cyclobenzaprine (FLEXERIL) 10 MG tablet Take 1 tablet (10 mg total) by mouth at bedtime as needed. 90 tablet 3   DULoxetine (CYMBALTA) 60 MG capsule Take 1 capsule (60 mg total) by mouth daily. 90 capsule 1   Fingolimod HCl (GILENYA) 0.5 MG CAPS TAKE ONE CAPSULE BY MOUTH ONCE DAILY. MAY TAKE WITH OR WITHOUT FOOD. STORE AT ROOM TEMPERATURE. 90 capsule 3   HYDROcodone-acetaminophen (NORCO/VICODIN) 5-325 MG tablet Take 1 tablet by mouth every 4 (four) hours as needed for moderate pain. 20 tablet 0   IRON PO Take 1 tablet by mouth 2 (two) times a week. occ     letrozole (FEMARA) 2.5 MG tablet Take 1 tablet (2.5 mg total) by mouth daily. (Patient taking differently: Take 2.5 mg by mouth at bedtime.) 90 tablet 3   Multiple Vitamin (MULTIVITAMIN) tablet Take 1 tablet by mouth daily.     valACYclovir (VALTREX) 1000 MG tablet Take 0.5 tablets (500 mg total) by mouth daily. (Patient taking differently: Take 500 mg by mouth daily as needed. In summer prn for fever blisters) 30 tablet 12   vitamin B-12 (CYANOCOBALAMIN) 1000 MCG tablet Take 1,000 mcg by mouth daily.      zolpidem (AMBIEN) 10 MG tablet 5 mg at bedtime.     amphetamine-dextroamphetamine (ADDERALL XR) 30 MG 24 hr capsule Take 1 capsule (30 mg total) by mouth daily. 30 capsule 0   HYDROcodone-acetaminophen (NORCO/VICODIN) 5-325 MG tablet TAKE 1 TO 2 TABLETS BY MOUTH THREE TIMES DAILY AS NEEDED      Facility-Administered Medications Prior to Visit  Medication Dose Route Frequency Provider Last Rate Last Admin   gadopentetate dimeglumine (MAGNEVIST) injection 20 mL  20 mL Intravenous Once PRN Ceana Fiala, Nanine Means, MD       gadopentetate dimeglumine (MAGNEVIST) injection 20 mL  20 mL Intravenous Once PRN Orvis Stann, Nanine Means, MD        PAST MEDICAL HISTORY: Past Medical History:  Diagnosis Date   Anemia    Anxiety    Arthritis    oa   Cancer ([place]) 2018   right breast   Depression    Fractured rib hx of 2018   healed   History of hiatal hernia    small per ct per pt   History of kidney stones yrs ago   passed on own   History of radiation therapy 06/02/16-07/14/16   right  breast 50.4 Gy in 28 fractions   Memory loss    occ   Multiple sclerosis [facility])    age 19   Shingles 2016   back   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   Urinary urgency    Wears glasses    Wears glasses     PAST SURGICAL HISTORY: Past Surgical History:  Procedure Laterality Date   ADENOIDECTOMY     BREAST LUMPECTOMY WITH RADIOACTIVE SEED AND SENTINEL LYMPH NODE BIOPSY Right 03/25/2016   Procedure: RIGHT BREAST SEED GUIDED PARTIAL MASTECTOMY WITH RIGHT SENITINEL LYMPH NODE [place];  Surgeon: Erroll Luna, MD;  Location: [place];  Service: General;  Laterality: Right;   BREAST RECONSTRUCTION Right 03/25/2016   Procedure: RIGHT BREAST RECONSTRUCTION;  Surgeon: Loel Lofty Dillingham, DO;  Location: [place];  Service: Plastics;  Laterality: Right;   BREAST REDUCTION SURGERY Right 03/25/2016   Procedure: MAMMARY REDUCTION  (BREAST) RIGHT;  Surgeon: Loel Lofty Dillingham, DO;  Location: [place];  Service: Plastics;  Laterality: Right;   BREAST REDUCTION SURGERY Left 01/06/2017   Procedure: LEFT MAMMARY REDUCTION  (BREAST) FOR SYMMETRY WITH LIPOSUCTION;  Surgeon: Wallace Going, DO;  Location: [place];  Service: Plastics;  Laterality: Left;   BREAST SURGERY Left 01/25/2018   breast  reduction   BUNIONECTOMY Bilateral 2011   pins in both big toes   CESAREAN SECTION     x 1   COLONOSCOPY W/ BIOPSIES AND POLYPECTOMY  2018 and 2021   f/u 5 yrs now   [place], URETEROSCOPY AND STENT PLACEMENT Right 05/13/2020   Procedure: CYSTOSCOPY WITH RETROGRADE PYELOGRAM, URETEROSCOPY AND STENT PLACEMENT;  Surgeon: Robley Fries, MD;  [facility]: [facility];  Service: Urology;  Laterality: Right;  1 HR   HOLMIUM LASER APPLICATION Right 10/18/3714   Procedure: HOLMIUM LASER APPLICATION;  Surgeon: Robley Fries, MD;  Location[facility];  Service: Urology;  Laterality: Right;   INCISION AND DRAINAGE OF WOUND Right 04/21/2016   Procedure: IRRIGATION AND DEBRIDEMENT RIGHT BREAST WOUND;  Surgeon: Wallace Going, DO;  Location: [place];  Service: Plastics;  Laterality: Right;   MASS EXCISION Left 02/24/2017   Procedure: LEFT BREAST WOUND EXCISION AND CLOSURE WITH ACELL APPLICATION;  Surgeon: Wallace Going, DO;  Location: [place];  Service: Plastics;  Laterality: Left;   TONSILLECTOMY  age 83   and adenoids    FAMILY HISTORY: Family History  Problem Relation Age of Onset   Osteoporosis Mother    Heart disease Mother    Stroke Mother    COPD Mother    Cancer Father        colon   Heart disease Brother    Heart attack Brother     SOCIAL HISTORY:  Social History   Socioeconomic History   Marital status: Divorced    Spouse name: Not on file   Number of children: 1   Years of education: Not on file   Highest education level: Not on file  Occupational History   Occupation: Recruitment consultant  Tobacco Use   Smoking status: Never   Smokeless tobacco: Never  Vaping Use   Vaping Use: Never used  Substance and Sexual Activity   Alcohol use: Yes    Comment: rare   Drug use: No   Sexual activity: Not Currently    Partners: Male    Birth control/protection: Condom    Comment:  partner has had vasectomy  Other Topics Concern   Not on file  Social History Narrative   Not on file   Social Determinants of Health   Financial Resource Strain: Not on file  Food Insecurity: Not on file  Transportation Needs: Not on file  Physical Activity: Not on file  Stress: Not on file  Social Connections: Not on file  Intimate Partner Violence: Not on file     PHYSICAL EXAM  Vitals:   02/23/21 1113  BP: (!) 158/79  Pulse: 76  Weight: 228 lb 12.8 oz (103.8 kg)  Height: 5\' 4"  (1.626 m)    Body mass index is 39.27 kg/m.   General: She was tearful today.  The patient is well-developed and well-nourished and in no acute distress  Musculoskeletal:   She has mild cervical tenderness on the left and the trapezius muscle.  Mild reduced range of motion of the neck.  Neurologic Exam  Mental status: The patient is alert and oriented x 3 at the time of the examination. The patient has apparent normal recent and remote memory, with an apparently normal attention span and concentration ability.   Speech is normal.  Cranial nerves: Extraocular movements are full.  Facial strength and sensation is normal.  Trapezius strength is normal.  Hearing is normal.  Motor:  Muscle bulk and tone are normal. Strength is  5 / 5 in all 4 extremities.   Sensory: Intact sensation to touch and vibration in the arms and legs.  Coordination: Finger-nose-finger and heel-to-shin was performed well bilaterally.  Gait and station: Station and gait are normal.  Tandem gait is mildly wide  Romberg negative,   Reflexes: Deep tendon reflexes are symmetric and brisk bilaterally but no ankle clonus.    Multiple sclerosis ([place]) - Plan: CBC with Differential/Platelet, Comprehensive metabolic panel, Varicella zoster antibody, IgG  Gait disturbance  Attention deficit disorder, unspecified hyperactivity presence  Depression with anxiety  Neck pain  High risk medication use - Plan: CBC with  Differential/Platelet, Comprehensive metabolic panel, Varicella zoster antibody, IgG  Vitamin D deficiency - Plan: VITAMIN D 25 Hydroxy (Vit-D Deficiency, Fractures)  Current moderate episode of major depressive disorder, unspecified whether recurrent ([place])    1.   Try to resume Gilenya.  Unfortunately, she is several weeks off of the Gilenya and will need to do a first day observation.  I will see if we can get this set up.  Unfortunately, the generic drug companies will not pay for this as [employer] had done.  Therefore, it would have to be covered by her insurance.  We will check CBC with differential and CMP and other blood work 2.   Continue Cymbalta 60 mg.  Add Abilify for worsening mood issues.   3.   Stay active and exercise as tolerated. 4.    Continue Adderall.  I will send a refill today. 5.    Return in 6 months or sooner if there are new or worsening neurologic symptoms.  45-minute office visit with the majority of the time spent face-to-face for history and physical, discussion/counseling and decision-making.  Additional time with record review and documentation.   Kaisha Wachob A. Felecia Shelling, MD, PhD 0/35/5974, 16:38 PM Certified in Neurology, Clinical Neurophysiology, Sleep Medicine, Pain Medicine and Neuroimaging  [facility] [address] [place], [place] [address] [phone]

## 2021-02-24 ENCOUNTER — Telehealth: Payer: Self-pay

## 2021-02-24 DIAGNOSIS — G35 Multiple sclerosis: Secondary | ICD-10-CM

## 2021-02-24 DIAGNOSIS — Z79899 Other long term (current) drug therapy: Secondary | ICD-10-CM

## 2021-02-24 NOTE — Telephone Encounter (Signed)
I spoke with Dr. Felecia Shelling. Patient needs to restart Gilenya and have an FDO.  I contacted [employer]. If the patient has a PA approval for Gilenya, they can assist with the FDO.  I completed a PA for Gilenya via CMM and sent to [facility]. Key: [id number].  It was approved: "The drug above is APPROVED through 02/07/2022 for up to a 30-day supply. You asked for a 90 day supply. This drug is a specialty drug. Your Prescription Drug Guide (PDG) says that specialty drugs are limited to a 30-day supply per fill. The 30-day supply limit on certain drugs also applies to mail-order. You can view your PDG and see a full list of in-network pharmacies by visiting [place] online or call the number on the back of your card."  I have let Lucita Ferrara at [employer] know.

## 2021-02-26 ENCOUNTER — Encounter: Payer: Self-pay | Admitting: Neurology

## 2021-02-28 LAB — CBC WITH DIFFERENTIAL/PLATELET
Basophils Absolute: 0 10*3/uL (ref 0.0–0.2)
Basos: 0 %
EOS (ABSOLUTE): 0 10*3/uL (ref 0.0–0.4)
Eos: 0 %
Hematocrit: 42.5 % (ref 34.0–46.6)
Hemoglobin: 14.1 g/dL (ref 11.1–15.9)
Immature Grans (Abs): 0.1 10*3/uL (ref 0.0–0.1)
Immature Granulocytes: 1 %
Lymphocytes Absolute: 0.4 10*3/uL — ABNORMAL LOW (ref 0.7–3.1)
Lymphs: 5 %
MCH: 30.1 pg (ref 26.6–33.0)
MCHC: 33.2 g/dL (ref 31.5–35.7)
MCV: 91 fL (ref 79–97)
Monocytes Absolute: 0.5 10*3/uL (ref 0.1–0.9)
Monocytes: 7 %
Neutrophils Absolute: 6.7 10*3/uL (ref 1.4–7.0)
Neutrophils: 87 %
Platelets: 273 10*3/uL (ref 150–450)
RBC: 4.69 x10E6/uL (ref 3.77–5.28)
RDW: 13.7 % (ref 11.7–15.4)
WBC: 7.7 10*3/uL (ref 3.4–10.8)

## 2021-02-28 LAB — COMPREHENSIVE METABOLIC PANEL
ALT: 151 IU/L — ABNORMAL HIGH (ref 0–32)
AST: 67 IU/L — ABNORMAL HIGH (ref 0–40)
Albumin/Globulin Ratio: 2.4 — ABNORMAL HIGH (ref 1.2–2.2)
Albumin: 5.2 g/dL — ABNORMAL HIGH (ref 3.8–4.9)
Alkaline Phosphatase: 137 IU/L — ABNORMAL HIGH (ref 44–121)
BUN/Creatinine Ratio: 28 — ABNORMAL HIGH (ref 9–23)
BUN: 29 mg/dL — ABNORMAL HIGH (ref 6–24)
Bilirubin Total: 0.4 mg/dL (ref 0.0–1.2)
CO2: 23 mmol/L (ref 20–29)
Calcium: 10 mg/dL (ref 8.7–10.2)
Chloride: 101 mmol/L (ref 96–106)
Creatinine, Ser: 1.04 mg/dL — ABNORMAL HIGH (ref 0.57–1.00)
Globulin, Total: 2.2 g/dL (ref 1.5–4.5)
Glucose: 85 mg/dL (ref 70–99)
Potassium: 4.3 mmol/L (ref 3.5–5.2)
Sodium: 142 mmol/L (ref 134–144)
Total Protein: 7.4 g/dL (ref 6.0–8.5)
eGFR: 62 mL/min/{1.73_m2} (ref >59)

## 2021-02-28 LAB — CYP2C9 GENOTYPING SIPONIMOD

## 2021-02-28 LAB — VITAMIN D 25 HYDROXY (VIT D DEFICIENCY, FRACTURES): Vit D, 25-Hydroxy: 55.4 ng/mL (ref 30.0–100.0)

## 2021-02-28 LAB — VARICELLA ZOSTER ANTIBODY, IGG: Varicella zoster IgG: 1408 index (ref >165)

## 2021-03-02 NOTE — Telephone Encounter (Signed)
I LM x 2 (02/26/21 and 03/02/21) to go over lab results

## 2021-03-02 NOTE — Telephone Encounter (Signed)
I called the [place] program to check the status of the FDO. As of 12/231/2022, they will not be assisting at all with the [place] FDO's. That includes scheduling and shipping the starter kit to the facilities. Patient's will need to obtain an [place] from their pharmacy and then bring it to the facility. This is contrary to the information I received from [employer] last week from [place].

## 2021-03-02 NOTE — Telephone Encounter (Signed)
I called [facility]. They do not offer Gilenya FDOs any longer.

## 2021-03-02 NOTE — Telephone Encounter (Signed)
I spoke with Dr. Felecia Shelling. The Gilenya FDO can be done at the cardiology office.  I called patient. I advised her that [employer] is unable to help her with the FDO for Gilenya any longer. She reports that she received a shipment of Gilenya from the [employer] and has it on hand to take at her FDO. I advised her that we will refer her to [facility]. Pt verbalized understanding.

## 2021-03-03 ENCOUNTER — Encounter: Payer: Self-pay | Admitting: Neurology

## 2021-03-16 ENCOUNTER — Encounter: Payer: Self-pay | Admitting: Cardiology

## 2021-03-16 ENCOUNTER — Ambulatory Visit: Payer: Medicare HMO | Admitting: Cardiology

## 2021-03-16 ENCOUNTER — Other Ambulatory Visit: Payer: Self-pay

## 2021-03-16 VITALS — BP 135/76 | HR 95 | Temp 98.2°F | Resp 16 | Ht 64.0 in | Wt 230.0 lb

## 2021-03-16 DIAGNOSIS — Z5181 Encounter for therapeutic drug level monitoring: Secondary | ICD-10-CM

## 2021-03-16 DIAGNOSIS — G35 Multiple sclerosis: Secondary | ICD-10-CM

## 2021-03-16 NOTE — Progress Notes (Signed)
Primary Physician/Referring:  [name], MD  Patient ID: Raven Montoya, female    DOB: Jan 10, 1961, 61 y.o.   MRN: [id number]  No chief complaint on file.  HPI:    Raven Montoya  is a 61 y.o. Caucasian female patient with multiple sclerosis, morbid obesity, referred to be for therapeutic drug monitoring and reinitiation of Gilenya.  Patient previously was on Gilenya however due to insurance reasons had come off of it.  She has no specific complaints except for mild chronic dyspnea attributed to her weight but states that she continues to remain active without leg edema, PND or orthopnea.   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   Urinary urgency    Wears glasses    Wears glasses    Past Surgical History:  Procedure Laterality Date   ADENOIDECTOMY     BREAST LUMPECTOMY WITH RADIOACTIVE SEED AND SENTINEL LYMPH NODE BIOPSY Right 03/25/2016   Procedure: RIGHT BREAST SEED GUIDED PARTIAL MASTECTOMY WITH RIGHT [place];  Surgeon: Erroll Luna, MD;  Location: [place];  Service: General;  Laterality: Right;   BREAST RECONSTRUCTION Right 03/25/2016   Procedure: RIGHT BREAST RECONSTRUCTION;  Surgeon: Loel Lofty Dillingham, DO;  Location: [place];  Service: Plastics;  Laterality: Right;   BREAST REDUCTION SURGERY Right 03/25/2016   Procedure: MAMMARY REDUCTION  (BREAST) RIGHT;  Surgeon: Loel Lofty Dillingham, DO;  Location: [place];  Service: Plastics;  Laterality: Right;   BREAST REDUCTION SURGERY Left 01/06/2017   Procedure: LEFT  MAMMARY REDUCTION  (BREAST) FOR SYMMETRY WITH LIPOSUCTION;  Surgeon: Wallace Going, DO;  Location: [place];  Service: Plastics;  Laterality: Left;   BREAST SURGERY Left 01/25/2018   breast reduction   BUNIONECTOMY Bilateral 2011   pins in both big toes   CESAREAN SECTION     x 1   COLONOSCOPY W/ BIOPSIES AND POLYPECTOMY  2018 and 2021   f/u 5 yrs now   [place], URETEROSCOPY AND STENT PLACEMENT Right 05/13/2020   Procedure: CYSTOSCOPY WITH RETROGRADE PYELOGRAM, URETEROSCOPY AND STENT PLACEMENT;  Surgeon: Robley Fries, MD;  Location: [facility];  Service: Urology;  Laterality: Right;  1 HR   HOLMIUM LASER APPLICATION Right 10/17/3568   Procedure: HOLMIUM LASER APPLICATION;  Surgeon: Robley Fries, MD;  Location: [facility];  Service: Urology;  Laterality: Right;   INCISION AND DRAINAGE OF WOUND Right 04/21/2016   Procedure: IRRIGATION AND DEBRIDEMENT RIGHT BREAST WOUND;  Surgeon: Wallace Going, DO;  Location: [place];  Service: Plastics;  Laterality: Right;   MASS EXCISION Left 02/24/2017   Procedure: LEFT BREAST WOUND EXCISION AND CLOSURE WITH ACELL APPLICATION;  Surgeon: Wallace Going, DO;  Location: [place];  Service: Plastics;  Laterality: Left;  TONSILLECTOMY  age 70   and adenoids   Family History  Problem Relation Age of Onset   Osteoporosis Mother    Heart disease Mother    Stroke Mother    COPD Mother    Cancer Father        colon   Heart disease Brother    Heart attack Brother     Social History   Tobacco Use   Smoking status: Never   Smokeless tobacco: Never  Substance Use Topics   Alcohol use: Yes    Comment: rare   Marital Status: Divorced  ROS  Review of Systems  Cardiovascular:  Negative for chest pain, dyspnea on exertion and leg swelling.  Objective  Blood pressure 135/76, pulse 95, temperature 98.2 F (36.8 C),  temperature source Temporal, resp. rate 16, height 5\' 4"  (1.626 m), weight 230 lb (104.3 kg), last menstrual period 12/30/2014, SpO2 99 %. Body mass index is 39.48 kg/m.  Vitals with BMI 03/16/2021 02/23/2021 08/21/2020  Height 5\' 4"  5\' 4"  5\' 4"   Weight 230 lbs 228 lbs 13 oz 221 lbs 8 oz  BMI 24.23 53.61 38  Systolic 443 154 008  Diastolic 76 79 77  Pulse 95 76 81    Physical Exam Constitutional:      Appearance: She is morbidly obese.  Neck:     Vascular: No carotid bruit or JVD.  Cardiovascular:     Rate and Rhythm: Normal rate and regular rhythm.     Pulses: Intact distal pulses.     Heart sounds: Normal heart sounds. No murmur heard.   No gallop.  Pulmonary:     Effort: Pulmonary effort is normal.     Breath sounds: Normal breath sounds.  Abdominal:     General: Bowel sounds are normal.     Palpations: Abdomen is soft.  Musculoskeletal:     Right lower leg: No edema.     Left lower leg: No edema.     Medications and allergies  No Known Allergies   Medication list after today's encounter   Current Outpatient Medications  Medication Instructions   amphetamine-dextroamphetamine (ADDERALL XR) 30 MG 24 hr capsule 30 mg, Oral, Daily   ARIPiprazole (ABILIFY) 5 mg, Oral, Daily   CALCIUM CARBONATE PO 250 mg, Oral, Daily   cholecalciferol (VITAMIN D) 1,000 Units, Oral, Daily   cyclobenzaprine (FLEXERIL) 10 mg, Oral, At bedtime PRN   DULoxetine (CYMBALTA) 60 mg, Oral, Daily   Fingolimod HCl (GILENYA) 0.5 MG CAPS TAKE ONE CAPSULE BY MOUTH ONCE DAILY. MAY TAKE WITH OR WITHOUT FOOD. STORE AT ROOM TEMPERATURE.   HYDROcodone-acetaminophen (NORCO/VICODIN) 5-325 MG tablet 1 tablet, Oral, Every 4 hours PRN   IRON PO 1 tablet, Oral, 2 times weekly, occ    letrozole (FEMARA) 2.5 mg, Oral, Daily   Multiple Vitamin (MULTIVITAMIN) tablet 1 tablet, Oral, Daily   valACYclovir (VALTREX) 500 mg, Oral, Daily   vitamin B-12 (CYANOCOBALAMIN) 1,000 mcg, Oral, Daily   zolpidem (AMBIEN) 5 mg, Daily  at bedtime    Laboratory examination:   Recent Labs    08/21/20 1203 02/23/21 1211  NA 142 142  K 4.2 4.3  CL 100 101  CO2 27 23  GLUCOSE 116* 85  BUN 21 29*  CREATININE 1.09* 1.04*  CALCIUM 9.3 10.0   CrCl cannot be calculated (Patient's most recent lab result is older than the maximum 21 days allowed.).  CMP Latest Ref Rng & Units 02/23/2021 08/21/2020 02/19/2020  Glucose 70 - 99 mg/dL 85 116(H)  90  BUN 6 - 24 mg/dL 29(H) 21 22  Creatinine 0.57 - 1.00 mg/dL 1.04(H) 1.09(H) 1.30(H)  Sodium 134 - 144 mmol/L 142 142 141  Potassium 3.5 - 5.2 mmol/L 4.3 4.2 4.4  Chloride 96 - 106 mmol/L 101 100 99  CO2 20 - 29 mmol/L 23 27 29   Calcium 8.7 - 10.2 mg/dL 10.0 9.3 9.6  Total Protein 6.0 - 8.5 g/dL 7.4 6.2 6.7  Total Bilirubin 0.0 - 1.2 mg/dL 0.4 0.5 0.7  Alkaline Phos 44 - 121 IU/L 137(H) 114 98  AST 0 - 40 IU/L 67(H) 21 18  ALT 0 - 32 IU/L 151(H) 20 17   CBC Latest Ref Rng & Units 02/23/2021 08/21/2020 02/19/2020  WBC 3.4 - 10.8 x10E3/uL 7.7 3.9 3.3(L)  Hemoglobin 11.1 - 15.9 g/dL 14.1 13.1 13.5  Hematocrit 34.0 - 46.6 % 42.5 41.8 40.2  Platelets 150 - 450 x10E3/uL 273 208 252    Radiology:     Cardiac Studies:   NA EKG:   EKG 03/16/2021: Normal sinus rhythm with rate of 92 bpm, incomplete right bundle branch block.  Normal EKG.    Assessment     ICD-10-CM   1. Therapeutic drug monitoring  Z51.81 EKG 12-Lead    2. Class 2 severe obesity due to excess calories with serious comorbidity and body mass index (BMI) of 39.0 to 39.9 in adult [facility])  E66.01 Ambulatory referral to Nutrition and Diabetic Education   Z68.39 Ambulatory referral to [facility]    3. H/O multiple sclerosis ([place])  [place] Ambulatory referral to Nutrition and Diabetic Education    Ambulatory referral to [facility]       There are no discontinued medications.  No orders of the defined types were placed in this encounter.  Orders Placed This Encounter   Procedures   Ambulatory referral to Nutrition and Diabetic Education    Referral Priority:   Routine    Referral Type:   Consultation    Referral Reason:   Specialty Services Required    Number of Visits Requested:   1   Ambulatory referral to [facility]    Referral Priority:   Routine    Referral Type:   Consultation    Referral Reason:   Specialty Services Required    Number of Visits Requested:   1   EKG 12-Lead   Recommendations:   LENOLA LOCKNER is a 61 y.o. Caucasian female patient with multiple sclerosis, morbid obesity, referred to be for therapeutic drug monitoring and reinitiation of Gilenya.  Patient previously was on Gilenya however due to insurance reasons had come off of it.  She has no specific complaints except for mild chronic dyspnea attributed to her weight but states that she continues to remain active without leg edema, PND or orthopnea.  Physical examination except for morbid obesity is unremarkable and EKG is normal.  I do not have a lipid profile.  I do not see any contraindication for her to initiate Gilenya, will perform therapeutic drug monitoring.  We discussed regarding weight loss, referral made to nutrition education and also [facility].  I will see her back on a as needed basis if after therapeutic drug monitoring there is no significant EKG abnormalities of bradycardia.    Adrian Prows, MD, [facility] 03/16/2021, 9:44 PM Office: [phone]

## 2021-03-17 NOTE — Telephone Encounter (Signed)
Patient is scheduled for her Gilenya FDO on 03/26/2021 at [place].

## 2021-03-19 ENCOUNTER — Other Ambulatory Visit: Payer: Self-pay | Admitting: Neurology

## 2021-03-20 ENCOUNTER — Encounter: Payer: Self-pay | Admitting: Neurology

## 2021-03-24 ENCOUNTER — Other Ambulatory Visit: Payer: Self-pay | Admitting: Neurology

## 2021-03-24 MED ORDER — METHYLPHENIDATE HCL 10 MG PO TABS: ORAL_TABLET | ORAL | 0 refills | Status: DC

## 2021-03-25 ENCOUNTER — Encounter: Payer: Self-pay | Admitting: Neurology

## 2021-03-26 ENCOUNTER — Ambulatory Visit: Payer: Medicare HMO | Admitting: Cardiology

## 2021-03-26 ENCOUNTER — Other Ambulatory Visit: Payer: Self-pay | Admitting: *Deleted

## 2021-03-26 ENCOUNTER — Other Ambulatory Visit: Payer: Self-pay

## 2021-03-26 ENCOUNTER — Encounter: Payer: Self-pay | Admitting: Cardiology

## 2021-03-26 DIAGNOSIS — G35 Multiple sclerosis: Secondary | ICD-10-CM

## 2021-03-26 DIAGNOSIS — Z5181 Encounter for therapeutic drug level monitoring: Secondary | ICD-10-CM

## 2021-03-26 MED ORDER — AMPHETAMINE-DEXTROAMPHET ER 30 MG PO CP24: 30.0000 mg | ORAL_CAPSULE | Freq: Every day | ORAL | 0 refills | Status: DC

## 2021-03-26 NOTE — Progress Notes (Signed)
Primary Physician/Referring:  [name], MD  Patient ID: Raven Montoya, female    DOB: 10-25-1960, 61 y.o.   MRN: [id number]  Chief Complaint  Patient presents with   [place]    HPI:    Raven Montoya  is a 61 y.o. Caucasian female patient with multiple sclerosis, morbid obesity, referred to be for therapeutic drug monitoring and reinitiation of Gilenya.  Patient previously was on Gilenya however due to insurance reasons had come off of it.  Today she presents for Gilenya administration and therapeutic drug observation.  Except for mild chronic dyspnea no new symptoms.   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   Urinary urgency    Wears glasses    Wears glasses    Past Surgical History:  Procedure Laterality Date   ADENOIDECTOMY     BREAST LUMPECTOMY WITH RADIOACTIVE SEED AND SENTINEL LYMPH NODE BIOPSY Right 03/25/2016   Procedure: RIGHT BREAST SEED GUIDED PARTIAL MASTECTOMY WITH RIGHT [place];  Surgeon: Erroll Luna, MD;  Location: [facility];  Service: General;  Laterality: Right;   BREAST RECONSTRUCTION Right 03/25/2016   Procedure: RIGHT BREAST RECONSTRUCTION;  Surgeon: Loel Lofty Dillingham, DO;  Location: [facility];  Service: Plastics;  Laterality: Right;   BREAST REDUCTION SURGERY Right 03/25/2016   Procedure: MAMMARY REDUCTION  (BREAST) RIGHT;  Surgeon: Loel Lofty Dillingham, DO;  Location: [facility];  Service: Plastics;  Laterality: Right;   BREAST REDUCTION SURGERY Left 01/06/2017    Procedure: LEFT MAMMARY REDUCTION  (BREAST) FOR SYMMETRY WITH LIPOSUCTION;  Surgeon: Wallace Going, DO;  Location: [facility];  Service: Plastics;  Laterality: Left;   BREAST SURGERY Left 01/25/2018   breast reduction   BUNIONECTOMY Bilateral 2011   pins in both big toes   CESAREAN SECTION     x 1   COLONOSCOPY W/ BIOPSIES AND POLYPECTOMY  2018 and 2021   f/u 5 yrs now   [place], URETEROSCOPY AND STENT PLACEMENT Right 05/13/2020   Procedure: CYSTOSCOPY WITH RETROGRADE PYELOGRAM, URETEROSCOPY AND STENT PLACEMENT;  Surgeon: Robley Fries, MD;  Location: [facility];  Service: Urology;  Laterality: Right;  1 HR   HOLMIUM LASER APPLICATION Right 04/16/1015   Procedure: HOLMIUM LASER APPLICATION;  Surgeon: Robley Fries, MD;  Location: [facility];  Service: Urology;  Laterality: Right;   INCISION AND DRAINAGE OF WOUND Right 04/21/2016   Procedure: IRRIGATION AND DEBRIDEMENT RIGHT BREAST WOUND;  Surgeon: Wallace Going, DO;  Location: [facility];  Service: Plastics;  Laterality: Right;   MASS EXCISION Left 02/24/2017   Procedure: LEFT BREAST WOUND EXCISION AND CLOSURE WITH ACELL APPLICATION;  Surgeon: Wallace Going, DO;  Location: [facility];  Service: Plastics;  Laterality: Left;  TONSILLECTOMY  age 61   and adenoids   Family History  Problem Relation Age of Onset   Osteoporosis Mother    Heart disease Mother    Stroke Mother    COPD Mother    Cancer Father        colon   Heart disease Brother    Heart attack Brother     Social History   Tobacco Use   Smoking status: Never   Smokeless tobacco: Never  Substance Use Topics   Alcohol use: Yes    Comment: rare   Marital Status: Divorced  ROS  Review of Systems  Cardiovascular:  Negative for chest pain, dyspnea on exertion and leg swelling.  Objective  Blood pressure 130/72, pulse 71, temperature 97.8 F  (36.6 C), temperature source Temporal, resp. rate 17, height 5\' 4"  (1.626 m), weight 233 lb 3.2 oz (105.8 kg), last menstrual period 12/30/2014, SpO2 99 %. Body mass index is 40.03 kg/m.  Vitals with BMI 03/26/2021 03/26/2021 03/26/2021  Height - - -  Weight - - -  BMI - - -  Systolic 675 916 384  Diastolic 72 66 71  Pulse 71 74 71    Physical Exam Constitutional:      Appearance: She is morbidly obese.  Neck:     Vascular: No carotid bruit or JVD.  Cardiovascular:     Rate and Rhythm: Normal rate and regular rhythm.     Pulses: Intact distal pulses.     Heart sounds: Normal heart sounds. No murmur heard.   No gallop.  Pulmonary:     Effort: Pulmonary effort is normal.     Breath sounds: Normal breath sounds.     Medications and allergies  No Known Allergies   Medication list after today's encounter   Current Outpatient Medications  Medication Instructions   allopurinol (ZYLOPRIM) 100 mg, Daily   amphetamine-dextroamphetamine (ADDERALL XR) 30 MG 24 hr capsule 30 mg, Oral, Daily   ARIPiprazole (ABILIFY) 5 mg, Oral, Daily   CALCIUM CARBONATE PO 250 mg, Oral, Daily   cholecalciferol (VITAMIN D) 1,000 Units, Oral, Daily   cyclobenzaprine (FLEXERIL) 10 mg, Oral, At bedtime PRN   DULoxetine (CYMBALTA) 60 mg, Oral, Daily   Fingolimod HCl (GILENYA) 0.5 MG CAPS TAKE ONE CAPSULE BY MOUTH ONCE DAILY. MAY TAKE WITH OR WITHOUT FOOD. STORE AT ROOM TEMPERATURE.   HYDROcodone-acetaminophen (NORCO/VICODIN) 5-325 MG tablet 1 tablet, Oral, Every 4 hours PRN   IRON PO 1 tablet, Oral, 2 times weekly, occ    letrozole (FEMARA) 2.5 mg, Oral, Daily   meloxicam (MOBIC) 15 mg, Oral, Daily PRN   methylphenidate (RITALIN) 10 MG tablet Take 2 po qAM and one po qNoon   Multiple Vitamin (MULTIVITAMIN) tablet 1 tablet, Oral, Daily   valACYclovir (VALTREX) 500 mg, Oral, Daily   vitamin B-12 (CYANOCOBALAMIN) 1,000 mcg, Oral, Daily   zolpidem (AMBIEN) 5 mg, Daily at bedtime    Laboratory examination:    Recent Labs    08/21/20 1203 02/23/21 1211  NA 142 142  K 4.2 4.3  CL 100 101  CO2 27 23  GLUCOSE 116* 85  BUN 21 29*  CREATININE 1.09* 1.04*  CALCIUM 9.3 10.0    CBC Latest Ref Rng & Units 02/23/2021 08/21/2020 02/19/2020  WBC 3.4 - 10.8 x10E3/uL 7.7 3.9 3.3(L)  Hemoglobin 11.1 - 15.9 g/dL 14.1 13.1 13.5  Hematocrit 34.0 - 46.6 % 42.5 41.8 40.2  Platelets 150 - 450 x10E3/uL 273 208 252    Radiology:  Cardiac Studies:   NA EKG:    EKG 03/26/2021 at 1630 hrs.: Normal sinus rhythm at rate of 75 bpm, incomplete right bundle branch block.  Normal EKG. No change from 11 AM EKG, previous heart rate was.   EKG 03/16/2021: Normal sinus rhythm with rate of 92 bpm, incomplete right bundle branch block.  Normal EKG.    Assessment     ICD-10-CM   1. Therapeutic drug monitoring  Z51.81 EKG 12-Lead    EKG 12-Lead    2. H/O multiple sclerosis ([place])  G35        There are no discontinued medications.  No orders of the defined types were placed in this encounter.  Orders Placed This Encounter  Procedures   EKG 12-Lead   EKG 12-Lead   Recommendations:   Raven Montoya is a 61 y.o. Caucasian female patient with multiple sclerosis, morbid obesity, referred to be for therapeutic drug monitoring and reinitiation of Gilenya.  Patient previously was on Gilenya however due to insurance reasons had come off of it.  Today she presents for Gilenya administration and therapeutic drug observation.  I personally reviewed her vital signs throughout the observation time, also I repeated the EKG today, no change in heart rate and she remains asymptomatic and vital signs are stable.  From cardiac standpoint regarding any contraindication for her to continue taking Gilenya for multiple sclerosis.  I will see her back on a as needed basis.  I will forward my note to Dr. Arlice Colt, neurology.  Discussed with the patient.    Adrian Prows, MD, [facility] 03/26/2021, 4:46 PM Office: [phone]

## 2021-03-26 NOTE — Progress Notes (Unsigned)
[facility]  PATIENT: Raven Montoya DOB: 11-07-1960  REFERRING CLINICIAN: Anastasia Pall HISTORY FROM: Patient  REASON FOR VISIT: MS   HISTORICAL  CHIEF COMPLAINT:  No chief complaint on file.   HISTORY OF PRESENT ILLNESS:  Raven Montoya is a 61 y.o. woman with relapsing remitting MS.      Update 02/23/2021: She is on Gilenya and tolerates it well.   However, due to insurance issues, enrolling in an assistance problem and pharmacy issues, she has been off x 3 weeks.    She felt in September that her right leg was weaker x 6 weeks - she noted difficulty getting into a car.     Her 12/16/2020 MRI showed no changes compared to the 01/13/18 MRI.      She has had worse depression the last couple weeks though notes no new stressors in life.    She is on duloxetine 60 mg.  She also feels more nervous as well.  She feels more scatter brained since this started.  She has not seen behavioral health.    She has fatigue.  Adderall has helped her attention deficit nd fatigue.   Recently she is sleeping worse the past month (sleep maintenance issues).  Her gait is mildly off balanced she could walk a mile without stopping.   She will use a bannister on stairs.  She has mild left leg weakness and spasticity associated with pain.   Spasms and cramps occur at night when she has been more active but not most nights.  Some will wake her up.   Her bladder has done better since starting oxybutynin.    She had stones removed.   Vision is stable and she has recent examination.   ' She is noting neck pain helped by PT through Ortho with benefit.  Marland Kitchen   ROM is mildly reduced in her neck.   Meloxicam was stopped due to elevated creatinine.      MS History:   She was diagnosed with multiple sclerosis at age 34 after presenting with optic neuritis in the left eye. Her neurologist discussed with her that she probably had MS but no medication was started at that time. Vision got better over the next  few weeks. A couple years later, she went to see Dr. Effie Shy. He felt that the MS should be treated and she was started on Rebif. She remain on Rebif for many years   She had not had any major exacerbations while on Rebif but had needles fatigue.    In 2014, she switched to [place]. She tolerates Gilenya well.   Imaging: MRI of the brain 01/11/2018 shows T2/FLAIR hyperintense foci in the periventricular, juxtacortical and deep white matter.  There were no new lesions.  No change compared to 01/25/2017.  MRI of the cervical spine 01/11/2018 showed a subtle focus to the left at C4.Marland Kitchen  She had multilevel degenerative changes with a disc protrusion at C2-C3 towards the left, disc protrusion, posterior longitudinal ligament hypertrophy and facet hypertrophy at C3-C4 and C4-C5.  There is mild spinal stenosis at those levels.  MRI of the brain 01/25/2017 showed Multiple T2/FLAIR hyperintense foci in the periventricular, juxtacortical and deep white matter of both hemispheres in a pattern and configuration consistent with chronic demyelinating plaque associated with multiple sclerosis. None of the foci appears to be acute and there is no change when compared to the 02/12/2015 MRI.  REVIEW OF SYSTEMS:  Constitutional: No fevers, chills, sweats, or change  in appetite.   She has fatigue and mild insomnia (helped by Ambien) Eyes: No visual changes, double vision, eye pain Ear, nose and throat: No hearing loss, ear pain, nasal congestion, sore throat Cardiovascular: No chest pain, palpitations Respiratory:  No shortness of breath at rest or with exertion.   No wheezes GastrointestinaI: No nausea, vomiting, diarrhea, abdominal pain, fecal incontinence Genitourinary:  No dysuria, urinary retention or frequency.  No nocturia. Musculoskeletal:  Mild neck pain, no major back pain Integumentary: No rash, pruritus, skin lesions Neurological: as above Psychiatric: as above.    Endocrine: No palpitations,  diaphoresis, change in appetite, change in weigh or increased thirst Hematologic/Lymphatic:  No anemia, purpura, petechiae. Allergic/Immunologic: No itchy/runny eyes, nasal congestion, recent allergic reactions, rashes  ALLERGIES: No Known Allergies   HOME MEDICATIONS: Outpatient Medications Prior to Visit  Medication Sig Dispense Refill   ARIPiprazole (ABILIFY) 5 MG tablet TAKE 1 TABLET (5 MG TOTAL) BY MOUTH DAILY. 90 tablet 2   CALCIUM CARBONATE PO Take 250 mg by mouth daily.      cholecalciferol (VITAMIN D) 1000 units tablet Take 1,000 Units by mouth daily.      cyclobenzaprine (FLEXERIL) 10 MG tablet Take 1 tablet (10 mg total) by mouth at bedtime as needed. 90 tablet 3   DULoxetine (CYMBALTA) 60 MG capsule Take 1 capsule (60 mg total) by mouth daily. 90 capsule 1   Fingolimod HCl (GILENYA) 0.5 MG CAPS TAKE ONE CAPSULE BY MOUTH ONCE DAILY. MAY TAKE WITH OR WITHOUT FOOD. STORE AT ROOM TEMPERATURE. 90 capsule 3   HYDROcodone-acetaminophen (NORCO/VICODIN) 5-325 MG tablet Take 1 tablet by mouth every 4 (four) hours as needed for moderate pain. 20 tablet 0   IRON PO Take 1 tablet by mouth 2 (two) times a week. occ     letrozole (FEMARA) 2.5 MG tablet Take 1 tablet (2.5 mg total) by mouth daily. (Patient taking differently: Take 2.5 mg by mouth at bedtime.) 90 tablet 3   methylphenidate (RITALIN) 10 MG tablet Take 2 po qAM and one po qNoon 90 tablet 0   Multiple Vitamin (MULTIVITAMIN) tablet Take 1 tablet by mouth daily.     valACYclovir (VALTREX) 1000 MG tablet Take 0.5 tablets (500 mg total) by mouth daily. (Patient taking differently: Take 500 mg by mouth daily as needed. In summer prn for fever blisters) 30 tablet 12   vitamin B-12 (CYANOCOBALAMIN) 1000 MCG tablet Take 1,000 mcg by mouth daily.      zolpidem (AMBIEN) 10 MG tablet 5 mg at bedtime.     amphetamine-dextroamphetamine (ADDERALL XR) 30 MG 24 hr capsule Take 1 capsule (30 mg total) by mouth daily. 30 capsule 0    Facility-Administered Medications Prior to Visit  Medication Dose Route Frequency Provider Last Rate Last Admin   gadopentetate dimeglumine (MAGNEVIST) injection 20 mL  20 mL Intravenous Once PRN Susen Haskew, Nanine Means, MD       gadopentetate dimeglumine (MAGNEVIST) injection 20 mL  20 mL Intravenous Once PRN Nicolaus Andel, Nanine Means, MD        PAST MEDICAL HISTORY: Past Medical History:  Diagnosis Date   Anemia    Anxiety    Arthritis    oa   Cancer ([place]) 2018   right breast   Depression    Fractured rib hx of 2018   healed   History of hiatal hernia    small per ct per pt   History of kidney stones yrs ago   passed on own   History of radiation  therapy 06/02/16-07/14/16   right breast 50.4 Gy in 28 fractions   Memory loss    occ   Multiple sclerosis [facility])    age 42   Shingles 2016   back   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   Urinary urgency    Wears glasses    Wears glasses     PAST SURGICAL HISTORY: Past Surgical History:  Procedure Laterality Date   ADENOIDECTOMY     BREAST LUMPECTOMY WITH RADIOACTIVE SEED AND SENTINEL LYMPH NODE BIOPSY Right 03/25/2016   Procedure: RIGHT BREAST SEED GUIDED PARTIAL MASTECTOMY WITH RIGHT SENITINEL LYMPH NODE [place];  Surgeon: Erroll Luna, MD;  Location: [facility];  Service: General;  Laterality: Right;   BREAST RECONSTRUCTION Right 03/25/2016   Procedure: RIGHT BREAST RECONSTRUCTION;  Surgeon: Loel Lofty Dillingham, DO;  Location: [place];  Service: Plastics;  Laterality: Right;   BREAST REDUCTION SURGERY Right 03/25/2016   Procedure: MAMMARY REDUCTION  (BREAST) RIGHT;  Surgeon: Loel Lofty Dillingham, DO;  Location: [place];  Service: Plastics;  Laterality: Right;   BREAST REDUCTION SURGERY Left 01/06/2017   Procedure: LEFT MAMMARY REDUCTION  (BREAST) FOR SYMMETRY WITH LIPOSUCTION;  Surgeon: Wallace Going, DO;  Location: [facility];  Service: Plastics;  Laterality: Left;   BREAST SURGERY Left 01/25/2018   breast  reduction   BUNIONECTOMY Bilateral 2011   pins in both big toes   CESAREAN SECTION     x 1   COLONOSCOPY W/ BIOPSIES AND POLYPECTOMY  2018 and 2021   f/u 5 yrs now   [place], URETEROSCOPY AND STENT PLACEMENT Right 05/13/2020   Procedure: CYSTOSCOPY WITH RETROGRADE PYELOGRAM, URETEROSCOPY AND STENT PLACEMENT;  Surgeon: Robley Fries, MD;  Location: [facility];  Service: Urology;  Laterality: Right;  1 HR   HOLMIUM LASER APPLICATION Right 08/13/2829   Procedure: HOLMIUM LASER APPLICATION;  Surgeon: Robley Fries, MD;  Location: [facility];  Service: Urology;  Laterality: Right;   INCISION AND DRAINAGE OF WOUND Right 04/21/2016   Procedure: IRRIGATION AND DEBRIDEMENT RIGHT BREAST WOUND;  Surgeon: Wallace Going, DO;  Location: [facility];  Service: Plastics;  Laterality: Right;   MASS EXCISION Left 02/24/2017   Procedure: LEFT BREAST WOUND EXCISION AND CLOSURE WITH ACELL APPLICATION;  Surgeon: Wallace Going, DO;  Location: [place];  Service: Plastics;  Laterality: Left;   TONSILLECTOMY  age 57   and adenoids    FAMILY HISTORY: Family History  Problem Relation Age of Onset   Osteoporosis Mother    Heart disease Mother    Stroke Mother    COPD Mother    Cancer Father        colon   Heart disease Brother    Heart attack Brother     SOCIAL HISTORY:  Social History   Socioeconomic History   Marital status: Divorced    Spouse name: Not on file   Number of children: 1   Years of education: Not on file   Highest education level: Not on file  Occupational History   Occupation: Recruitment consultant  Tobacco Use   Smoking status: Never   Smokeless tobacco: Never  Vaping Use   Vaping Use: Never used  Substance and Sexual Activity   Alcohol use: Yes    Comment: rare   Drug use: No   Sexual activity: Not Currently    Partners: Male    Birth control/protection: Condom  Comment:  partner has had vasectomy  Other Topics Concern   Not on file  Social History Narrative   Not on file   Social Determinants of Health   Financial Resource Strain: Not on file  Food Insecurity: Not on file  Transportation Needs: Not on file  Physical Activity: Not on file  Stress: Not on file  Social Connections: Not on file  Intimate Partner Violence: Not on file     PHYSICAL EXAM  There were no vitals filed for this visit.   There is no height or weight on file to calculate BMI.   General: She was tearful today.  The patient is well-developed and well-nourished and in no acute distress  Musculoskeletal:   She has mild cervical tenderness on the left and the trapezius muscle.  Mild reduced range of motion of the neck.  Neurologic Exam  Mental status: The patient is alert and oriented x 3 at the time of the examination. The patient has apparent normal recent and remote memory, with an apparently normal attention span and concentration ability.   Speech is normal.  Cranial nerves: Extraocular movements are full.  Facial strength and sensation is normal.  Trapezius strength is normal.  Hearing is normal.  Motor:  Muscle bulk and tone are normal. Strength is  5 / 5 in all 4 extremities.   Sensory: Intact sensation to touch and vibration in the arms and legs.  Coordination: Finger-nose-finger and heel-to-shin was performed well bilaterally.  Gait and station: Station and gait are normal.  Tandem gait is mildly wide  Romberg negative,   Reflexes: Deep tendon reflexes are symmetric and brisk bilaterally but no ankle clonus.    No diagnosis found.    1.   Try to resume Gilenya.  Unfortunately, she is several weeks off of the Gilenya and will need to do a first day observation.  I will see if we can get this set up.  Unfortunately, the generic drug companies will not pay for this as [employer] had done.  Therefore, it would have to be covered by her insurance.  We will check  CBC with differential and CMP and other blood work 2.   Continue Cymbalta 60 mg.  Add Abilify for worsening mood issues.   3.   Stay active and exercise as tolerated. 4.    Continue Adderall.  I will send a refill today. 5.    Return in 6 months or sooner if there are new or worsening neurologic symptoms.  45-minute office visit with the majority of the time spent face-to-face for history and physical, discussion/counseling and decision-making.  Additional time with record review and documentation.   Zelene Barga A. Felecia Shelling, MD, PhD 9/60/4540, 98:11 AM Certified in Neurology, Clinical Neurophysiology, Sleep Medicine, Pain Medicine and Neuroimaging  [facility] [address], [place], [place] [address] [phone]

## 2021-03-30 NOTE — Telephone Encounter (Signed)
Patient completed her Gilenya FDO on 03/26/21 and did well with Gilenya per cardiology. She can continue Gilenya at home and follow up as scheduled in July.

## 2021-05-24 ENCOUNTER — Other Ambulatory Visit: Payer: Self-pay | Admitting: Neurology

## 2021-07-29 ENCOUNTER — Other Ambulatory Visit: Payer: Self-pay | Admitting: *Deleted

## 2021-07-29 ENCOUNTER — Encounter: Payer: Self-pay | Admitting: Neurology

## 2021-07-29 MED ORDER — PHENTERMINE HCL 37.5 MG PO CAPS: 37.5000 mg | ORAL_CAPSULE | ORAL | 5 refills | Status: DC

## 2021-08-03 ENCOUNTER — Telehealth: Payer: Self-pay | Admitting: Neurology

## 2021-08-27 ENCOUNTER — Ambulatory Visit: Payer: Medicare HMO | Admitting: Neurology

## 2021-11-09 ENCOUNTER — Other Ambulatory Visit: Payer: Self-pay

## 2021-11-09 DIAGNOSIS — G35 Multiple sclerosis: Secondary | ICD-10-CM

## 2021-11-09 MED ORDER — FINGOLIMOD HCL 0.5 MG PO CAPS: ORAL_CAPSULE | ORAL | 3 refills | Status: DC

## 2021-11-11 ENCOUNTER — Encounter (INDEPENDENT_AMBULATORY_CARE_PROVIDER_SITE_OTHER): Payer: Medicare HMO | Admitting: Family Medicine

## 2021-11-11 DIAGNOSIS — Z0289 Encounter for other administrative examinations: Secondary | ICD-10-CM

## 2021-11-12 ENCOUNTER — Ambulatory Visit (INDEPENDENT_AMBULATORY_CARE_PROVIDER_SITE_OTHER): Payer: Medicare HMO | Admitting: Family Medicine

## 2021-11-12 ENCOUNTER — Encounter (INDEPENDENT_AMBULATORY_CARE_PROVIDER_SITE_OTHER): Payer: Self-pay | Admitting: Family Medicine

## 2021-11-12 VITALS — BP 137/87 | HR 80 | Temp 97.5°F | Ht 63.0 in | Wt 223.0 lb

## 2021-11-12 DIAGNOSIS — R0602 Shortness of breath: Secondary | ICD-10-CM | POA: Diagnosis not present

## 2021-11-12 DIAGNOSIS — R7401 Elevation of levels of liver transaminase levels: Secondary | ICD-10-CM | POA: Diagnosis not present

## 2021-11-12 DIAGNOSIS — R5383 Other fatigue: Secondary | ICD-10-CM | POA: Diagnosis not present

## 2021-11-12 DIAGNOSIS — E559 Vitamin D deficiency, unspecified: Secondary | ICD-10-CM | POA: Diagnosis not present

## 2021-11-12 DIAGNOSIS — F3289 Other specified depressive episodes: Secondary | ICD-10-CM | POA: Diagnosis not present

## 2021-11-12 DIAGNOSIS — R739 Hyperglycemia, unspecified: Secondary | ICD-10-CM

## 2021-11-12 DIAGNOSIS — E669 Obesity, unspecified: Secondary | ICD-10-CM

## 2021-11-12 DIAGNOSIS — Z6839 Body mass index (BMI) 39.0-39.9, adult: Secondary | ICD-10-CM

## 2021-11-13 LAB — CBC WITH DIFFERENTIAL/PLATELET
Basophils Absolute: 0 10*3/uL (ref 0.0–0.2)
Basos: 0 %
EOS (ABSOLUTE): 0.2 10*3/uL (ref 0.0–0.4)
Eos: 6 %
Hematocrit: 42.6 % (ref 34.0–46.6)
Hemoglobin: 14.3 g/dL (ref 11.1–15.9)
Immature Grans (Abs): 0 10*3/uL (ref 0.0–0.1)
Immature Granulocytes: 0 %
Lymphocytes Absolute: 0.2 10*3/uL — ABNORMAL LOW (ref 0.7–3.1)
Lymphs: 7 %
MCH: 30.8 pg (ref 26.6–33.0)
MCHC: 33.6 g/dL (ref 31.5–35.7)
MCV: 92 fL (ref 79–97)
Monocytes Absolute: 0.3 10*3/uL (ref 0.1–0.9)
Monocytes: 11 %
Neutrophils Absolute: 2.4 10*3/uL (ref 1.4–7.0)
Neutrophils: 76 %
Platelets: 222 10*3/uL (ref 150–450)
RBC: 4.64 x10E6/uL (ref 3.77–5.28)
RDW: 14 % (ref 11.7–15.4)
WBC: 3.1 10*3/uL — ABNORMAL LOW (ref 3.4–10.8)

## 2021-11-13 LAB — COMPREHENSIVE METABOLIC PANEL
ALT: 34 IU/L — ABNORMAL HIGH (ref 0–32)
AST: 35 IU/L (ref 0–40)
Albumin/Globulin Ratio: 2.1 (ref 1.2–2.2)
Albumin: 4.5 g/dL (ref 3.8–4.9)
Alkaline Phosphatase: 116 IU/L (ref 44–121)
BUN/Creatinine Ratio: 21 (ref 9–23)
BUN: 22 mg/dL (ref 6–24)
Bilirubin Total: 0.5 mg/dL (ref 0.0–1.2)
CO2: 25 mmol/L (ref 20–29)
Calcium: 9.2 mg/dL (ref 8.7–10.2)
Chloride: 101 mmol/L (ref 96–106)
Creatinine, Ser: 1.04 mg/dL — ABNORMAL HIGH (ref 0.57–1.00)
Globulin, Total: 2.1 g/dL (ref 1.5–4.5)
Glucose: 90 mg/dL (ref 70–99)
Potassium: 4.1 mmol/L (ref 3.5–5.2)
Sodium: 141 mmol/L (ref 134–144)
Total Protein: 6.6 g/dL (ref 6.0–8.5)
eGFR: 62 mL/min/{1.73_m2} (ref >59)

## 2021-11-13 LAB — T4, FREE: Free T4: 1.25 ng/dL (ref 0.82–1.77)

## 2021-11-13 LAB — T3: T3, Total: 105 ng/dL (ref 71–180)

## 2021-11-13 LAB — VITAMIN D 25 HYDROXY (VIT D DEFICIENCY, FRACTURES): Vit D, 25-Hydroxy: 37.4 ng/mL (ref 30.0–100.0)

## 2021-11-13 LAB — HEMOGLOBIN A1C
Est. average glucose Bld gHb Est-mCnc: 100 mg/dL
Hgb A1c MFr Bld: 5.1 % (ref 4.8–5.6)

## 2021-11-13 LAB — INSULIN, RANDOM: INSULIN: 15.6 u[IU]/mL (ref 2.6–24.9)

## 2021-11-13 LAB — LIPID PANEL WITH LDL/HDL RATIO
Cholesterol, Total: 198 mg/dL (ref 100–199)
HDL: 52 mg/dL (ref >39)
LDL Chol Calc (NIH): 113 mg/dL — ABNORMAL HIGH (ref 0–99)
LDL/HDL Ratio: 2.2 ratio (ref 0.0–3.2)
Triglycerides: 192 mg/dL — ABNORMAL HIGH (ref 0–149)
VLDL Cholesterol Cal: 33 mg/dL (ref 5–40)

## 2021-11-13 LAB — TSH: TSH: 0.99 u[IU]/mL (ref 0.450–4.500)

## 2021-11-19 NOTE — Progress Notes (Signed)
Chief Complaint:   OBESITY Raven Montoya (MR# [id number]) is a 61 y.o. female who presents for evaluation and treatment of obesity and related comorbidities. Current BMI is Body mass index is 39.5 kg/m. Raven Montoya has been struggling with her weight for many years and has been unsuccessful in either losing weight, maintaining weight loss, or reaching her healthy weight goal.  Raven Montoya is currently on Phentermine by Dr. Felecia Shelling. Started gaining weight in early 40's. Lives with her son Vonna Kotyk). Eats out 3x's a week. Skips breakfast sometimes lunch. Breakfast--protein shake [employer]) (somewhat satisfied). 12-12:30, [place] sandwich with mayo+tomato (satisfied). 6:30/7--Pasta with ground beef+chesses. Mat struggle with poor food choices.  Nayla is currently in the action stage of change and ready to dedicate time achieving and maintaining a healthier weight. Taralyn is interested in becoming our patient and working on intensive lifestyle modifications including (but not limited to) diet and exercise for weight loss.  Jolie's habits were reviewed today and are as follows: Her family eats meals together, she thinks her family will eat healthier with her, her desired weight loss is 50 pounds, she has been heavy most of her life, she started gaining weight in her 2's, her heaviest weight ever was 225 pounds, she snacks frequently in the evenings, she skips meals frequently, she frequently makes poor food choices, and she struggles with emotional eating.  Depression Screen Gustava's Food and Mood (modified PHQ-9) score was 11.     11/12/2021    8:30 AM  Depression screen PHQ 2/9  Decreased Interest 1  Down, Depressed, Hopeless 2  PHQ - 2 Score 3  Altered sleeping 2  Tired, decreased energy 3  Change in appetite 2  Feeling bad or failure about yourself  1  Trouble concentrating 0  Moving slowly or fidgety/restless 0  Suicidal thoughts 1  PHQ-9 Score 12  Difficult doing work/chores  Somewhat difficult   Subjective:   1. Other fatigue EKG done 03/26/21 with Dr. Einar Gip. Gissele admits to daytime somnolence and admits to waking up still tired. Patient has a history of symptoms of daytime fatigue and morning headache. Delylah generally gets 5 or 6 hours of sleep per night, and states that she has not sleeping well most nights. Snoring is present. Apneic episodes are not present. Epworth Sleepiness Score is 6.    2. Shortness of breath Raven Montoya notes increasing shortness of breath with exercising and seems to be worsening over time with weight gain. She notes getting out of breath sooner with activity than she used to. This has not gotten worse recently. Raven Montoya denies shortness of breath at rest or orthopnea.   3. Transaminitis LFTs elevated previously. Raven Montoya reports a CT to elevate. Elevated LFTs.  4. Hyperglycemia Last blood glucose elevated. No A1c on file.  5. Vitamin D deficiency Raven Montoya is not on Vit D. Denies any nausea, vomiting or muscle weakness. She notes fatigue.  6. Other depression Raven Montoya is on Cymbalta 60 mg daily. Denies suicidal ideas, and homicidal ideas.  Assessment/Plan:   1. Other fatigue We will obtain labs today. Raven Montoya does feel that her weight is causing her energy to be lower than it should be. Fatigue may be related to obesity, depression or many other causes. Labs will be ordered, and in the meanwhile, Day will focus on self care including making healthy food choices, increasing physical activity and focusing on stress reduction.   - TSH - T4, free - T3  2. Shortness of breath We will obtain  labs today. Raven Montoya does feel that she gets out of breath more easily that she used to when she exercises. Raven Montoya's shortness of breath appears to be obesity related and exercise induced. She has agreed to work on weight loss and gradually increase exercise to treat her exercise induced shortness of breath. Will continue to monitor closely.   -  CBC with Differential/Platelet - Lipid Panel With LDL/HDL Ratio  3. Transaminitis We will obtain labs today.  - Comprehensive metabolic panel  4. Hyperglycemia We will obtain labs today.  - Insulin, random - Hemoglobin A1c  5. Vitamin D deficiency We will obtain labs today.  - VITAMIN D 25 Hydroxy (Vit-D Deficiency, Fractures)  6. Other depression Follow up on symptoms at next appointment.   7. Class 2 severe obesity with serious comorbidity and body mass index (BMI) of 39.0 to 39.9 in adult, unspecified obesity type [facility]) Raven Montoya is currently in the action stage of change and her goal is to continue with weight loss efforts. I recommend Raven Montoya begin the structured treatment plan as follows:  She has agreed to the Category 3 Plan.  Exercise goals: No exercise has been prescribed at this time.   Behavioral modification strategies: increasing lean protein intake, meal planning and cooking strategies, keeping healthy foods in the home, and planning for success.  She was informed of the importance of frequent follow-up visits to maximize her success with intensive lifestyle modifications for her multiple health conditions. She was informed we would discuss her lab results at her next visit unless there is a critical issue that needs to be addressed sooner. Raven Montoya agreed to keep her next visit at the agreed upon time to discuss these results.  Objective:   Blood pressure 137/87, pulse 80, temperature (!) 97.5 F (36.4 C), height '5\' 3"'$  (1.6 m), weight 223 lb (101.2 kg), last menstrual period 12/30/2014, SpO2 98 %. Body mass index is 39.5 kg/m.  EKG: Normal sinus rhythm, rate 75 bpm.  Indirect Calorimeter completed today shows a VO2 of 270 ml and a REE of 1858.  Her calculated basal metabolic rate is 6301 thus her basal metabolic rate is better than expected.  General: Cooperative, alert, well developed, in no acute distress. HEENT: Conjunctivae and lids  unremarkable. Cardiovascular: Regular rhythm.  Lungs: Normal work of breathing. Neurologic: No focal deficits.   Lab Results  Component Value Date   CREATININE 1.04 (H) 11/12/2021   BUN 22 11/12/2021   NA 141 11/12/2021   K 4.1 11/12/2021   CL 101 11/12/2021   CO2 25 11/12/2021   Lab Results  Component Value Date   ALT 34 (H) 11/12/2021   AST 35 11/12/2021   ALKPHOS 116 11/12/2021   BILITOT 0.5 11/12/2021   Lab Results  Component Value Date   HGBA1C 5.1 11/12/2021   HGBA1C 5.4 10/17/2012   Lab Results  Component Value Date   INSULIN 15.6 11/12/2021   Lab Results  Component Value Date   TSH 0.990 11/12/2021   Lab Results  Component Value Date   CHOL 198 11/12/2021   HDL 52 11/12/2021   LDLCALC 113 (H) 11/12/2021   TRIG 192 (H) 11/12/2021   CHOLHDL 3.4 10/17/2012   Lab Results  Component Value Date   WBC 3.1 (L) 11/12/2021   HGB 14.3 11/12/2021   HCT 42.6 11/12/2021   MCV 92 11/12/2021   PLT 222 11/12/2021   Lab Results  Component Value Date   IRON 26 (L) 10/17/2012   TIBC 440 10/17/2012  FERRITIN 4 (L) 10/17/2012   Attestation Statements:   Reviewed by clinician on day of visit: allergies, medications, problem list, medical history, surgical history, family history, social history, and previous encounter notes.  Time spent on visit including pre-visit chart review and post-visit charting and care was 40 minutes.   I, Elnora Morrison, RMA am acting as transcriptionist for Coralie Common, MD.  This is the patient's first visit at [facility]. The patient's NEW PATIENT PACKET was reviewed at length. Included in the packet: current and past health history, medications, allergies, ROS, gynecologic history (women only), surgical history, family history, social history, weight history, weight loss surgery history (for those that have had weight loss surgery), nutritional evaluation, mood and food questionnaire, PHQ9, Epworth questionnaire,  sleep habits questionnaire, patient life and health improvement goals questionnaire. These will all be scanned into the patient's chart under media.   During the visit, I independently reviewed the patient's EKG, bioimpedance scale results, and indirect calorimeter results. I used this information to tailor a meal plan for the patient that will help her to lose weight and will improve her obesity-related conditions going forward. I performed a medically necessary appropriate examination and/or evaluation. I discussed the assessment and treatment plan with the patient. The patient was provided an opportunity to ask questions and all were answered. The patient agreed with the plan and demonstrated an understanding of the instructions. Labs were ordered at this visit and will be reviewed at the next visit unless more critical results need to be addressed immediately. Clinical information was updated and documented in the EMR.   Time spent on visit including pre-visit chart review and post-visit care was 40 minutes.   I have reviewed the above documentation for accuracy and completeness, and I agree with the above. - Coralie Common, MD

## 2021-11-26 ENCOUNTER — Ambulatory Visit (INDEPENDENT_AMBULATORY_CARE_PROVIDER_SITE_OTHER): Payer: Medicare HMO | Admitting: Family Medicine

## 2021-11-26 ENCOUNTER — Encounter (INDEPENDENT_AMBULATORY_CARE_PROVIDER_SITE_OTHER): Payer: Self-pay | Admitting: Family Medicine

## 2021-11-26 VITALS — BP 124/84 | HR 104 | Temp 97.9°F | Ht 63.0 in | Wt 215.0 lb

## 2021-11-26 DIAGNOSIS — E559 Vitamin D deficiency, unspecified: Secondary | ICD-10-CM | POA: Diagnosis not present

## 2021-11-26 DIAGNOSIS — R7401 Elevation of levels of liver transaminase levels: Secondary | ICD-10-CM | POA: Diagnosis not present

## 2021-11-26 DIAGNOSIS — E669 Obesity, unspecified: Secondary | ICD-10-CM | POA: Diagnosis not present

## 2021-11-26 DIAGNOSIS — Z6838 Body mass index (BMI) 38.0-38.9, adult: Secondary | ICD-10-CM

## 2021-11-26 DIAGNOSIS — E88819 Insulin resistance, unspecified: Secondary | ICD-10-CM | POA: Diagnosis not present

## 2021-11-26 MED ORDER — VITAMIN D (ERGOCALCIFEROL) 1.25 MG (50000 UNIT) PO CAPS: 50000.0000 [IU] | ORAL_CAPSULE | ORAL | 0 refills | Status: DC

## 2021-12-02 NOTE — Progress Notes (Signed)
Chief Complaint:   OBESITY Raven Montoya is here to discuss her progress with her obesity treatment plan along with follow-up of her obesity related diagnoses. Hang is on the Category 3 Plan and states she is following her eating plan approximately 100% of the time. [name] states she is walking 3 miles 3 times per week.  Today's visit was #: 2 Starting weight: 223 lbs Starting date: 11/12/2021 Today's weight: 215 lbs Today's date: 11/26/2021 Total lbs lost to date: 8 lbs Total lbs lost since last in-office visit: 8  Interim History: Raven Montoya plan most consistently. Getting food into the house was biggest part of plan. No hunger. Getting all food in daily. Weighing protein at lunch and daily. Eating an additional apple, protein bars ans was getting all snack calories in. No upcoming plans but at some point will go to [place].  Subjective:   1. Transaminitis Labs discussed during visit today. ALT 151, AST 67 in Jan, recent labs 34 (ALT) 35 (AST). 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   2. Vitamin D deficiency Labs discussed during visit today. Notes fatigue. Vit d level of 37.4. Not on supplement.  3. Insulin resistance Labs discussed during visit today. [name]'s A1c at 5.1, insulin at 15.6. She is following Cat 3.  Assessment/Plan:   1. Transaminitis Repeat labs in 3-4 months.  2. Vitamin D deficiency We will refill Vit D 50,000 IU once weekly for 1 month with 0 refills.  -Refill Vitamin D, Ergocalciferol, (DRISDOL) 1.25 MG (50000 UNIT) CAPS capsule; Take 1 capsule (50,000 Units total) by mouth every 7 (seven) days.  Dispense: 4 capsule; Refill: 0  3. Insulin resistance Pathophysiology of IR, Prediabetes,diabetes dicussed today. No changes in meal plan. Will repeat labs in 3-4 months.  4. Obesity with current BMI of 38.1 Raven Montoya is currently in the action stage of change. As such, her goal is to continue with weight loss efforts. She has agreed to  the Category 3 Plan.   Exercise goals: All adults should avoid inactivity. Some physical activity is better than none, and adults who participate in any amount of physical activity gain some health benefits.  Behavioral modification strategies: increasing lean protein intake, meal planning and cooking strategies, keeping healthy foods in the home, and planning for success.  [name] has agreed to follow-up with our clinic in 3 weeks. She was informed of the importance of frequent follow-up visits to maximize her success with intensive lifestyle modifications for her multiple health conditions.   Objective:   Blood pressure 124/84, pulse (!) 104, temperature 97.9 F (36.6 C), height '5\' 3"'$  (1.6 m), weight 215 lb (97.5 kg), last menstrual period 12/30/2014, SpO2 94 %. Body mass index is 38.09 kg/m.  General: Cooperative, alert, well developed, in no acute distress. HEENT: Conjunctivae and lids unremarkable. Cardiovascular: Regular rhythm.  Lungs: Normal work of breathing. Neurologic: No focal deficits.   Lab Results  Component Value Date   CREATININE 1.04 (H) 11/12/2021   BUN 22 11/12/2021   NA 141 11/12/2021   K 4.1 11/12/2021   CL 101 11/12/2021   CO2 25 11/12/2021   Lab Results  Component Value Date   ALT 34 (H) 11/12/2021   AST 35 11/12/2021   ALKPHOS 116 11/12/2021   BILITOT 0.5 11/12/2021   Lab Results  Component Value Date   HGBA1C 5.1 11/12/2021   HGBA1C 5.4 10/17/2012   Lab Results  Component Value Date   INSULIN 15.6 11/12/2021   Lab Results  Component Value Date   TSH 0.990 11/12/2021   Lab Results  Component Value Date   CHOL 198 11/12/2021   HDL 52 11/12/2021   LDLCALC 113 (H) 11/12/2021   TRIG 192 (H) 11/12/2021   CHOLHDL 3.4 10/17/2012   Lab Results  Component Value Date   VD25OH 37.4 11/12/2021   VD25OH 55.4 02/23/2021   VD25OH 33.8 02/12/2019   Lab Results  Component Value Date   WBC 3.1 (L) 11/12/2021   HGB 14.3 11/12/2021   HCT 42.6  11/12/2021   MCV 92 11/12/2021   PLT 222 11/12/2021   Lab Results  Component Value Date   IRON 26 (L) 10/17/2012   TIBC 440 10/17/2012   FERRITIN 4 (L) 10/17/2012   Attestation Statements:   Reviewed by clinician on day of visit: allergies, medications, problem list, medical history, surgical history, family history, social history, and previous encounter notes.  Time spent on visit including pre-visit chart review and post-visit care and charting was 45 minutes.   I, Elnora Morrison, RMA am acting as transcriptionist for Coralie Common, MD.  I have reviewed the above documentation for accuracy and completeness, and I agree with the above. - Coralie Common, MD

## 2021-12-10 ENCOUNTER — Ambulatory Visit (INDEPENDENT_AMBULATORY_CARE_PROVIDER_SITE_OTHER): Payer: Medicare HMO | Admitting: Family Medicine

## 2021-12-10 ENCOUNTER — Encounter (INDEPENDENT_AMBULATORY_CARE_PROVIDER_SITE_OTHER): Payer: Self-pay | Admitting: Family Medicine

## 2021-12-10 ENCOUNTER — Telehealth: Payer: Self-pay | Admitting: Neurology

## 2021-12-10 VITALS — BP 146/87 | HR 99 | Temp 97.8°F | Ht 63.0 in | Wt 212.0 lb

## 2021-12-10 DIAGNOSIS — E669 Obesity, unspecified: Secondary | ICD-10-CM | POA: Diagnosis not present

## 2021-12-10 DIAGNOSIS — E7849 Other hyperlipidemia: Secondary | ICD-10-CM | POA: Diagnosis not present

## 2021-12-10 DIAGNOSIS — Z6837 Body mass index (BMI) 37.0-37.9, adult: Secondary | ICD-10-CM

## 2021-12-10 DIAGNOSIS — E559 Vitamin D deficiency, unspecified: Secondary | ICD-10-CM | POA: Diagnosis not present

## 2021-12-10 MED ORDER — VITAMIN D (ERGOCALCIFEROL) 1.25 MG (50000 UNIT) PO CAPS: 50000.0000 [IU] | ORAL_CAPSULE | ORAL | 0 refills | Status: DC

## 2021-12-10 NOTE — Telephone Encounter (Signed)
Called Pt left VM to call office to schedule appointment with Dr. Felecia Shelling. If pt call back we can off 11/8 at 9:30 because she is past due for a appointment.

## 2021-12-21 NOTE — Progress Notes (Signed)
Chief Complaint:   OBESITY Raven Montoya is here to discuss her progress with her obesity treatment plan along with follow-up of her obesity related diagnoses. Raven Montoya is on the Category 3 Plan and states she is following her eating plan approximately 100% of the time. Raven Montoya states she is exercising 0 minutes 0 times per week.  Today's visit was #: 3 Starting weight: 223 lbs Starting date: 11/12/2021 Today's weight: 212 lbs Today's date: 12/10/2021 Total lbs lost to date: 11 lbs Total lbs lost since last in-office visit: 3  Interim History: Raven Montoya is still following Cat 3 plan fairly strictly. She has occasionally added Lean Cuisine in and found it to be sufficient quantity. Sometimes dinner portion feels like too much. No plans to go anywhere or do anything in next few weeks.  Subjective:   1. Vitamin D deficiency Raven Montoya is on prescription Vit D level of 37.4. Denies any nausea, vomiting or muscle weakness. She notes fatigue.    2. Other hyperlipidemia Raven Montoya's last LDL 113, Trigly 192, HDL 52. She is not on medication.  Assessment/Plan:   1. Vitamin D deficiency We will refill Vit D 50k IU once a week for 1 month with 0 refills.  -Refill Vitamin D, Ergocalciferol, (DRISDOL) 1.25 MG (50000 UNIT) CAPS capsule; Take 1 capsule (50,000 Units total) by mouth every 7 (seven) days.  Dispense: 4 capsule; Refill: 0  2. Other hyperlipidemia We will repeat labs January/Feb 2024.  3. Obesity with current BMI of 37.6 Raven Montoya is currently in the action stage of change. As such, her goal is to continue with weight loss efforts. She has agreed to the Category 3 Plan.   Exercise goals: No exercise has been prescribed at this time.  Behavioral modification strategies: increasing lean protein intake, meal planning and cooking strategies, keeping healthy foods in the home, and holiday eating strategies .  Raven Montoya has agreed to follow-up with our clinic in 3 weeks. She was informed of the  importance of frequent follow-up visits to maximize her success with intensive lifestyle modifications for her multiple health conditions.   Objective:   Blood pressure (!) 146/87, pulse 99, temperature 97.8 F (36.6 C), height '5\' 3"'$  (1.6 m), weight 212 lb (96.2 kg), last menstrual period 12/30/2014, SpO2 98 %. Body mass index is 37.55 kg/m.  General: Cooperative, alert, well developed, in no acute distress. HEENT: Conjunctivae and lids unremarkable. Cardiovascular: Regular rhythm.  Lungs: Normal work of breathing. Neurologic: No focal deficits.   Lab Results  Component Value Date   CREATININE 1.04 (H) 11/12/2021   BUN 22 11/12/2021   NA 141 11/12/2021   K 4.1 11/12/2021   CL 101 11/12/2021   CO2 25 11/12/2021   Lab Results  Component Value Date   ALT 34 (H) 11/12/2021   AST 35 11/12/2021   ALKPHOS 116 11/12/2021   BILITOT 0.5 11/12/2021   Lab Results  Component Value Date   HGBA1C 5.1 11/12/2021   HGBA1C 5.4 10/17/2012   Lab Results  Component Value Date   INSULIN 15.6 11/12/2021   Lab Results  Component Value Date   TSH 0.990 11/12/2021   Lab Results  Component Value Date   CHOL 198 11/12/2021   HDL 52 11/12/2021   LDLCALC 113 (H) 11/12/2021   TRIG 192 (H) 11/12/2021   CHOLHDL 3.4 10/17/2012   Lab Results  Component Value Date   VD25OH 37.4 11/12/2021   VD25OH 55.4 02/23/2021   VD25OH 33.8 02/12/2019   Lab Results  Component  Value Date   WBC 3.1 (L) 11/12/2021   HGB 14.3 11/12/2021   HCT 42.6 11/12/2021   MCV 92 11/12/2021   PLT 222 11/12/2021   Lab Results  Component Value Date   IRON 26 (L) 10/17/2012   TIBC 440 10/17/2012   FERRITIN 4 (L) 10/17/2012    Obesity Behavioral Intervention:   Approximately 15 minutes were spent on the discussion below.  ASK: We discussed the diagnosis of obesity with Raven Montoya today and Raven Montoya agreed to give [place] permission to discuss obesity behavioral modification therapy today.  ASSESS: Raven Montoya has the  diagnosis of obesity and her BMI today is 37.6. Raven Montoya is in the action stage of change.   ADVISE: Raven Montoya was educated on the multiple health risks of obesity as well as the benefit of weight loss to improve her health. She was advised of the need for long term treatment and the importance of lifestyle modifications to improve her current health and to decrease her risk of future health problems.  AGREE: Multiple dietary modification options and treatment options were discussed and Raven Montoya agreed to follow the recommendations documented in the above note.  ARRANGE: Raven Montoya was educated on the importance of frequent visits to treat obesity as outlined per CMS and USPSTF guidelines and agreed to schedule her next follow up appointment today.  Attestation Statements:   Reviewed by clinician on day of visit: allergies, medications, problem list, medical history, surgical history, family history, social history, and previous encounter notes.  I, Elnora Morrison, RMA am acting as transcriptionist for Coralie Common, MD.  I have reviewed the above documentation for accuracy and completeness, and I agree with the above. - Coralie Common, MD

## 2021-12-24 ENCOUNTER — Encounter: Payer: Self-pay | Admitting: Neurology

## 2021-12-24 ENCOUNTER — Ambulatory Visit: Payer: Medicare HMO | Admitting: Neurology

## 2021-12-24 VITALS — BP 140/83 | HR 74 | Ht 63.0 in | Wt 219.0 lb

## 2021-12-24 DIAGNOSIS — D72810 Lymphocytopenia: Secondary | ICD-10-CM

## 2021-12-24 DIAGNOSIS — M542 Cervicalgia: Secondary | ICD-10-CM | POA: Diagnosis not present

## 2021-12-24 DIAGNOSIS — F418 Other specified anxiety disorders: Secondary | ICD-10-CM | POA: Diagnosis not present

## 2021-12-24 DIAGNOSIS — R269 Unspecified abnormalities of gait and mobility: Secondary | ICD-10-CM | POA: Diagnosis not present

## 2021-12-24 DIAGNOSIS — Z79899 Other long term (current) drug therapy: Secondary | ICD-10-CM

## 2021-12-24 DIAGNOSIS — G35 Multiple sclerosis: Secondary | ICD-10-CM | POA: Diagnosis not present

## 2021-12-24 NOTE — Progress Notes (Signed)
[facility]  PATIENT: Raven Montoya DOB: Aug 27, 1960  REFERRING CLINICIAN: Anastasia Pall HISTORY FROM: Patient  REASON FOR VISIT: MS   HISTORICAL  CHIEF COMPLAINT:  Chief Complaint  Patient presents with   Follow-up    Pt in room #1 and alone. Pt here today for f/u on her MS.    HISTORY OF PRESENT ILLNESS:  Raven Montoya is a 61 y.o. woman with relapsing remitting MS.      Update 12/24/2021: She is on Gilenya and tolerates it well.   She is getting drug from the drug company.     Her 12/16/2020 MRI showed no changes compared to the 01/13/18 MRI.      Her gait is mildly off balanced she could walk a mile without stopping.   She will use a bannister on stairs.  She has mild left leg weakness and spasticity associated with pain.    Spasms and cramps occur at night when she has been more active but not most nights.  Some will wake her up.    She feels both arms are weaker, esp the right.   She has some transient mild numbness in hands and feet but no persistent numbness,  Her bladder has done better since kidnety stones were removed.    Vision is stable and she has recent examination.    She has had worse depression the last couple weeks though notes no new stressors in life.    She is on duloxetine 60 mg.  She also feels more nervous as well.  She feels more scatter brained since this started.  She has not seen behavioral health.    She has fatigue.  Adderall has helped her attention deficit nd fatigue.   Recently she is sleeping worse the past month (sleep maintenance issues). ' She is noting neck pain helped by PT through Ortho with benefit.  Marland Kitchen   ROM is mildly reduced in her neck.   Meloxicam was stopped due to elevated creatinine.      Steroid TPIs have helped her neck pain in the past   ROM is reduced especially looking to the left.   She also has lumbar pai and reports one level jutting out (spondylolisthesis?)   MS History:   She was diagnosed with multiple  sclerosis at age 63 after presenting with optic neuritis in the left eye. Her neurologist discussed with her that she probably had MS but no medication was started at that time. Vision got better over the next few weeks. A couple years later, she went to see Dr. Effie Shy. He felt that the MS should be treated and she was started on Rebif. She remain on Rebif for many years   She had not had any major exacerbations while on Rebif but had needles fatigue.    In 2014, she switched to Molena. She tolerates Gilenya well.   Imaging: MRI of the brain 01/11/2018 shows T2/FLAIR hyperintense foci in the periventricular, juxtacortical and deep white matter.  There were no new lesions.  No change compared to 01/25/2017.  MRI of the cervical spine 01/11/2018 showed a subtle focus to the left at C4.Marland Kitchen  She had multilevel degenerative changes with a disc protrusion at C2-C3 towards the left, disc protrusion, posterior longitudinal ligament hypertrophy and facet hypertrophy at C3-C4 and C4-C5.  There is mild spinal stenosis at those levels.  MRI of the brain 01/25/2017 showed Multiple T2/FLAIR hyperintense foci in the periventricular, juxtacortical and deep white matter of both  hemispheres in a pattern and configuration consistent with chronic demyelinating plaque associated with multiple sclerosis. None of the foci appears to be acute and there is no change when compared to the 02/12/2015 MRI.  MRI brain 12/16/2020 was unchanged.  MRI cervical spine 12/16/2020 IMPRESSION: This MRI of the cervical spine without contrast shows the following: 1.  There are subtle T2 hyperintense foci within the spinal cord towards the left adjacent to C4 and possibly adjacent to C4-C5.  Both of these were present on the 2019 MRI.  They are consistent with chronic demyelinating plaque associated with multiple sclerosis. 2.   There is thickening of the posterior longitudinal ligament, possibly associated with ossification, from C2-C3  through C4-C5.  This causes mild spinal stenosis at C3-C4 and mild to moderate spinal stenosis at C4-C5 but no nerve root compression. 3.   At C5-C6, there is mild spinal stenosis due to left paramedian disc osteophyte complex causing moderate left foraminal narrowing but no nerve root compression. 4.   At C6-C7, there is a left paramedian disc protrusion causing mild left foraminal narrowing but no spinal stenosis or nerve root compression. 5.   Degenerative changes are stable compared to the 2019 MRI.  REVIEW OF SYSTEMS:  Constitutional: No fevers, chills, sweats, or change in appetite.   She has fatigue and mild insomnia (helped by Ambien) Eyes: No visual changes, double vision, eye pain Ear, nose and throat: No hearing loss, ear pain, nasal congestion, sore throat Cardiovascular: No chest pain, palpitations Respiratory:  No shortness of breath at rest or with exertion.   No wheezes GastrointestinaI: No nausea, vomiting, diarrhea, abdominal pain, fecal incontinence Genitourinary:  No dysuria, urinary retention or frequency.  No nocturia. Musculoskeletal:  Mild neck pain, no major back pain Integumentary: No rash, pruritus, skin lesions Neurological: as above Psychiatric: as above.    Endocrine: No palpitations, diaphoresis, change in appetite, change in weigh or increased thirst Hematologic/Lymphatic:  No anemia, purpura, petechiae. Allergic/Immunologic: No itchy/runny eyes, nasal congestion, recent allergic reactions, rashes  ALLERGIES: No Known Allergies   HOME MEDICATIONS: Outpatient Medications Prior to Visit  Medication Sig Dispense Refill   CALCIUM CARBONATE PO Take 250 mg by mouth daily.     DULoxetine (CYMBALTA) 60 MG capsule Take 1 capsule (60 mg total) by mouth daily. 90 capsule 2   Fingolimod HCl (GILENYA) 0.5 MG CAPS TAKE ONE CAPSULE BY MOUTH ONCE DAILY. MAY TAKE WITH OR WITHOUT FOOD. STORE AT ROOM TEMPERATURE. 90 capsule 3   IRON PO Take 1 tablet by mouth 2 (two) times  a week. occ     meloxicam (MOBIC) 15 MG tablet Take 15 mg by mouth daily as needed.     phentermine 37.5 MG capsule Take 1 capsule (37.5 mg total) by mouth every morning. 30 capsule 5   vitamin B-12 (CYANOCOBALAMIN) 1000 MCG tablet Take 1,000 mcg by mouth daily.     Vitamin D, Ergocalciferol, (DRISDOL) 1.25 MG (50000 UNIT) CAPS capsule Take 1 capsule (50,000 Units total) by mouth every 7 (seven) days. 4 capsule 0   zolpidem (AMBIEN) 10 MG tablet 5 mg at bedtime.     Facility-Administered Medications Prior to Visit  Medication Dose Route Frequency Provider Last Rate Last Admin   gadopentetate dimeglumine (MAGNEVIST) injection 20 mL  20 mL Intravenous Once PRN Aeric Burnham, Nanine Means, MD       gadopentetate dimeglumine (MAGNEVIST) injection 20 mL  20 mL Intravenous Once PRN Vanna Shavers, Nanine Means, MD        PAST  MEDICAL HISTORY: Past Medical History:  Diagnosis Date   Anemia    Anxiety    Arthritis    oa   Back pain    Bilateral swelling of feet    Cancer ([place]) 2018   right breast   Depression    Depression    Fatty liver    Fractured rib hx of 2018   healed   History of hiatal hernia    small per ct per pt   History of kidney stones yrs ago   passed on own   History of radiation therapy 06/02/16-07/14/16   right breast 50.4 Gy in 28 fractions   Joint pain    Memory loss    occ   Multiple sclerosis ([place])    age 43   Shingles 2016   back   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   Urinary urgency    Wears glasses    Wears glasses     PAST SURGICAL HISTORY: Past Surgical History:  Procedure Laterality Date   ADENOIDECTOMY     BREAST LUMPECTOMY WITH RADIOACTIVE SEED AND SENTINEL LYMPH NODE BIOPSY Right 03/25/2016   Procedure: RIGHT BREAST SEED GUIDED PARTIAL MASTECTOMY WITH RIGHT SENITINEL LYMPH NODE [place];  Surgeon: Erroll Luna, MD;  Location: [facility];  Service: General;  Laterality: Right;   BREAST RECONSTRUCTION Right 03/25/2016   Procedure: RIGHT BREAST  RECONSTRUCTION;  Surgeon: Loel Lofty Dillingham, DO;  Location: [facility];  Service: Plastics;  Laterality: Right;   BREAST REDUCTION SURGERY Right 03/25/2016   Procedure: MAMMARY REDUCTION  (BREAST) RIGHT;  Surgeon: Loel Lofty Dillingham, DO;  Location: [facility];  Service: Plastics;  Laterality: Right;   BREAST REDUCTION SURGERY Left 01/06/2017   Procedure: LEFT MAMMARY REDUCTION  (BREAST) FOR SYMMETRY WITH LIPOSUCTION;  Surgeon: Wallace Going, DO;  Location: [facility];  Service: Plastics;  Laterality: Left;   BREAST SURGERY Left 01/25/2018   breast reduction   BUNIONECTOMY Bilateral 2011   pins in both big toes   CESAREAN SECTION     x 1   COLONOSCOPY W/ BIOPSIES AND POLYPECTOMY  2018 and 2021   f/u 5 yrs now   [place], URETEROSCOPY AND STENT PLACEMENT Right 05/13/2020   Procedure: CYSTOSCOPY WITH RETROGRADE PYELOGRAM, URETEROSCOPY AND STENT PLACEMENT;  Surgeon: Robley Fries, MD;  Location[facility];  Service: Urology;  Laterality: Right;  1 HR   HOLMIUM LASER APPLICATION Right 7/0/2637   Procedure: HOLMIUM LASER APPLICATION;  Surgeon: Robley Fries, MD;  Location: [facility];  Service: Urology;  Laterality: Right;   INCISION AND DRAINAGE OF WOUND Right 04/21/2016   Procedure: IRRIGATION AND DEBRIDEMENT RIGHT BREAST WOUND;  Surgeon: Wallace Going, DO;  Location: [facility];  Service: Plastics;  Laterality: Right;   MASS EXCISION Left 02/24/2017   Procedure: LEFT BREAST WOUND EXCISION AND CLOSURE WITH ACELL APPLICATION;  Surgeon: Wallace Going, DO;  Location: [facility];  Service: Plastics;  Laterality: Left;   TONSILLECTOMY  age 57   and adenoids    FAMILY HISTORY: Family History  Problem Relation Age of Onset   Osteoporosis Mother    Heart disease Mother    Stroke Mother    COPD Mother    Cancer Father        colon   Heart disease Brother    Heart attack  Brother     SOCIAL HISTORY:  Social History  Socioeconomic History   Marital status: Divorced    Spouse name: Not on file   Number of children: 1   Years of education: Not on file   Highest education level: Not on file  Occupational History   Occupation: Recruitment consultant   Occupation: Retired  Tobacco Use   Smoking status: Never   Smokeless tobacco: Never  Scientific laboratory technician Use: Never used  Substance and Sexual Activity   Alcohol use: Yes    Comment: rare   Drug use: No   Sexual activity: Not Currently    Partners: Male    Birth control/protection: Condom    Comment: partner has had vasectomy  Other Topics Concern   Not on file  Social History Narrative   Not on file   Social Determinants of Health   Financial Resource Strain: Not on file  Food Insecurity: Not on file  Transportation Needs: Not on file  Physical Activity: Not on file  Stress: Not on file  Social Connections: Not on file  Intimate Partner Violence: Not on file     PHYSICAL EXAM  Vitals:   12/24/21 1120  BP: (!) 140/83  Pulse: 74  Weight: 219 lb (99.3 kg)  Height: '5\' 3"'$  (1.6 m)     Body mass index is 38.79 kg/m.   General: She was tearful today.  The patient is well-developed and well-nourished and in no acute distress  Musculoskeletal:   She has mild cervical tenderness on the left and also in the  the trapezius muscle.  Mild reduced range of motion of the neck., worse looking to the left.     Neurologic Exam  Mental status: The patient is alert and oriented x 3 at the time of the examination. The patient has apparent normal recent and remote memory, with an apparently normal attention span and concentration ability.   Speech is normal.  Cranial nerves: Extraocular movements are full.  Facial strength and sensation is normal.  Trapezius strength is normal.  Hearing is normal.  Motor:  Muscle bulk and tone are normal. Strength is  5 / 5 in all 4 extremities.   Sensory: Intact  sensation to touch and vibration in the arms and legs.  Coordination: Finger-nose-finger and heel-to-shin was performed well bilaterally.  Gait and station: Station and gait are normal.  Tandem gait is mildly wide  Romberg negative,   Reflexes: Deep tendon reflexes are symmetric and brisk bilaterally but no ankle clonus.    Multiple sclerosis (HCC)  High risk medication use  Gait disturbance  Depression with anxiety  Neck pain  Lymphopenia    1.   She is stable from [place] on Gilenya.   Lymphocytes are usually 0.2 but WBC was reduced this past time.  We will cut back to every other day.     2.   Continue Cymbalta 60 mg.  Add Abilify for worsening mood issues.   3.   Stay active and exercise as tolerated. 4.    Continue Adderall.  I will send a refill today. 5.    Trigger point injection of left splenius services, C6-C7 paraspinal and trapezius muscles with 80 mg Depo-Medrol in 3 cc Marcaine using sterile technique.  She tolerated the injection well and pain was a little bit better afterwards.  Return in 6 months or sooner if there are new or worsening neurologic symptoms.  45-minute office visit with the majority of the time spent face-to-face for history and physical, discussion/counseling and decision-making.  Additional  time with record review and documentation.   Roark Rufo A. Felecia Shelling, MD, PhD 42/87/6811, 57:26 PM Certified in Neurology, Clinical Neurophysiology, Sleep Medicine, Pain Medicine and Neuroimaging  [facility] [address] [place], [place] [address] [phone]

## 2022-01-04 ENCOUNTER — Ambulatory Visit (INDEPENDENT_AMBULATORY_CARE_PROVIDER_SITE_OTHER): Payer: Medicare HMO | Admitting: Family Medicine

## 2022-01-04 ENCOUNTER — Encounter (INDEPENDENT_AMBULATORY_CARE_PROVIDER_SITE_OTHER): Payer: Self-pay | Admitting: Family Medicine

## 2022-01-04 VITALS — BP 143/82 | HR 98 | Temp 98.1°F | Ht 63.0 in | Wt 207.0 lb

## 2022-01-04 DIAGNOSIS — R030 Elevated blood-pressure reading, without diagnosis of hypertension: Secondary | ICD-10-CM | POA: Diagnosis not present

## 2022-01-04 DIAGNOSIS — Z6836 Body mass index (BMI) 36.0-36.9, adult: Secondary | ICD-10-CM

## 2022-01-04 DIAGNOSIS — E559 Vitamin D deficiency, unspecified: Secondary | ICD-10-CM | POA: Diagnosis not present

## 2022-01-04 DIAGNOSIS — E669 Obesity, unspecified: Secondary | ICD-10-CM | POA: Diagnosis not present

## 2022-01-04 MED ORDER — VITAMIN D (ERGOCALCIFEROL) 1.25 MG (50000 UNIT) PO CAPS: 50000.0000 [IU] | ORAL_CAPSULE | ORAL | 0 refills | Status: DC

## 2022-01-13 NOTE — Progress Notes (Signed)
Chief Complaint:   OBESITY Raven Montoya is here to discuss her progress with her obesity treatment plan along with follow-up of her obesity related diagnoses. Raven Montoya is on the Category 3 Plan and states she is following her eating plan approximately 85-90% of the time. Raven Montoya states she is walking 1 mile 3 times per week.  Today's visit was #: 4 Starting weight: 223 lbs Starting date: 11/12/2021 Today's weight: 207 lbs Today's date: 01/04/2022 Total lbs lost to date: 16 lbs Total lbs lost since last in-office visit: 5  Interim History: Raven Montoya has been stressed since last night when stove top would not turn off.  Patient had a great Thanksgiving she did indulge in a piece of pie.  Occasionally getting bored with what she is eating.  No plans for the next few weeks in terms of events for travel.  No changes need to be made to category 3.  Subjective:   1. Vitamin D deficiency [place] is currently taking prescription Vit D 50,000 IU once a week. Denies any nausea, vomiting or muscle weakness. She notes fatigue.  2. Elevated blood pressure reading Currently reports an increase in blood pressure with MS medication. Denies chest pain, chest pressure and headache.  She is going to be changing medications for MS (decrease frequently).  Assessment/Plan:   1. Vitamin D deficiency We will refill Vit D 50K IU once a week for 1 month with 0 refills.  -Refill Vitamin D, Ergocalciferol, (DRISDOL) 1.25 MG (50000 UNIT) CAPS capsule; Take 1 capsule (50,000 Units total) by mouth every 7 (seven) days.  Dispense: 4 capsule; Refill: 0  2. Elevated blood pressure reading Will follow-up with blood pressure in the next 1 to 2 months to see if blood pressure changes with no changes in medication.  3. Obesity with current BMI of 36.8 Raven Montoya is currently in the action stage of change. As such, her goal is to continue with weight loss efforts. She has agreed to the Category 3 Plan.   Exercise goals:  All adults should avoid inactivity. Some physical activity is better than none, and adults who participate in any amount of physical activity gain some health benefits.  Behavioral modification strategies: increasing lean protein intake, meal planning and cooking strategies, keeping healthy foods in the home, holiday eating strategies , and planning for success.  Jessiah has agreed to follow-up with our clinic in 3 weeks. She was informed of the importance of frequent follow-up visits to maximize her success with intensive lifestyle modifications for her multiple health conditions.   Objective:   Blood pressure (!) 143/82, pulse 98, temperature 98.1 F (36.7 C), height '5\' 3"'$  (1.6 m), weight 207 lb (93.9 kg), last menstrual period 12/30/2014, SpO2 99 %. Body mass index is 36.67 kg/m.  General: Cooperative, alert, well developed, in no acute distress. HEENT: Conjunctivae and lids unremarkable. Cardiovascular: Regular rhythm.  Lungs: Normal work of breathing. Neurologic: No focal deficits.   Lab Results  Component Value Date   CREATININE 1.04 (H) 11/12/2021   BUN 22 11/12/2021   NA 141 11/12/2021   K 4.1 11/12/2021   CL 101 11/12/2021   CO2 25 11/12/2021   Lab Results  Component Value Date   ALT 34 (H) 11/12/2021   AST 35 11/12/2021   ALKPHOS 116 11/12/2021   BILITOT 0.5 11/12/2021   Lab Results  Component Value Date   HGBA1C 5.1 11/12/2021   HGBA1C 5.4 10/17/2012   Lab Results  Component Value Date   INSULIN 15.6  11/12/2021   Lab Results  Component Value Date   TSH 0.990 11/12/2021   Lab Results  Component Value Date   CHOL 198 11/12/2021   HDL 52 11/12/2021   LDLCALC 113 (H) 11/12/2021   TRIG 192 (H) 11/12/2021   CHOLHDL 3.4 10/17/2012   Lab Results  Component Value Date   VD25OH 37.4 11/12/2021   VD25OH 55.4 02/23/2021   VD25OH 33.8 02/12/2019   Lab Results  Component Value Date   WBC 3.1 (L) 11/12/2021   HGB 14.3 11/12/2021   HCT 42.6 11/12/2021    MCV 92 11/12/2021   PLT 222 11/12/2021   Lab Results  Component Value Date   IRON 26 (L) 10/17/2012   TIBC 440 10/17/2012   FERRITIN 4 (L) 10/17/2012   Attestation Statements:   Reviewed by clinician on day of visit: allergies, medications, problem list, medical history, surgical history, family history, social history, and previous encounter notes.  I, Elnora Morrison, RMA am acting as transcriptionist for Coralie Common, MD.  I have reviewed the above documentation for accuracy and completeness, and I agree with the above. - Coralie Common, MD

## 2022-01-28 ENCOUNTER — Ambulatory Visit (INDEPENDENT_AMBULATORY_CARE_PROVIDER_SITE_OTHER): Payer: Medicare HMO | Admitting: Family Medicine

## 2022-01-28 ENCOUNTER — Encounter (INDEPENDENT_AMBULATORY_CARE_PROVIDER_SITE_OTHER): Payer: Self-pay | Admitting: Family Medicine

## 2022-01-28 VITALS — BP 135/82 | HR 87 | Temp 98.0°F | Ht 63.0 in | Wt 210.0 lb

## 2022-01-28 DIAGNOSIS — E669 Obesity, unspecified: Secondary | ICD-10-CM | POA: Diagnosis not present

## 2022-01-28 DIAGNOSIS — R7401 Elevation of levels of liver transaminase levels: Secondary | ICD-10-CM

## 2022-01-28 DIAGNOSIS — E559 Vitamin D deficiency, unspecified: Secondary | ICD-10-CM | POA: Diagnosis not present

## 2022-01-28 DIAGNOSIS — Z6837 Body mass index (BMI) 37.0-37.9, adult: Secondary | ICD-10-CM

## 2022-01-28 MED ORDER — VITAMIN D (ERGOCALCIFEROL) 1.25 MG (50000 UNIT) PO CAPS: 50000.0000 [IU] | ORAL_CAPSULE | ORAL | 0 refills | Status: DC

## 2022-02-04 ENCOUNTER — Encounter: Payer: Self-pay | Admitting: Neurology

## 2022-02-15 NOTE — Progress Notes (Signed)
Chief Complaint:   OBESITY Raven Montoya is here to discuss her progress with her obesity treatment plan along with follow-up of her obesity related diagnoses. Raven Montoya is on the Category 3 Plan and states she is following her eating plan approximately 80% of the time. [place] states she is exercising 0 minutes 0 times per week.  Today's visit was #: 5 Starting weight: 223 lbs Starting date: 11/12/2021 Today's weight: 210 lbs Today's date: 01/28/2022 Total lbs lost to date: 13 lbs Total lbs lost since last in-office visit: 0  Interim History: Raven Montoya's first 2 weeks since her last appointment were more difficult.  She struggled with depression the first 2 weeks.  Was able to get more on plan in the last week.  Felt she overstretched and her back has been sore.  Patient's future mother in law in hospice.  Subjective:   1. Vitamin D deficiency [place] is currently taking prescription Vit D 50,000 IU once a week. Denies any nausea, vomiting or muscle weakness. She notes fatigue.  2. Transaminitis Improved LFTs on last draw.  Drug introduced.  Assessment/Plan:   1. Vitamin D deficiency We will refill Vit D 50K IU once a week for 1 month with 0 refills.  -Refill Vitamin D, Ergocalciferol, (DRISDOL) 1.25 MG (50000 UNIT) CAPS capsule; Take 1 capsule (50,000 Units total) by mouth every 7 (seven) days.  Dispense: 4 capsule; Refill: 0  2. Transaminitis Will repeat LFTs in February.  3. Obesity with current BMI of 37.2 Raven Montoya is currently in the action stage of change. As such, her goal is to continue with weight loss efforts. She has agreed to the Category 3 Plan.   Exercise goals: All adults should avoid inactivity. Some physical activity is better than none, and adults who participate in any amount of physical activity gain some health benefits.  Raven Montoya is to start thinking about an exercise plan.  Behavioral modification strategies: increasing lean protein intake, meal planning  and cooking strategies, keeping healthy foods in the home, holiday eating strategies , and planning for success.  Raven Montoya has agreed to follow-up with our clinic in 4 weeks. She was informed of the importance of frequent follow-up visits to maximize her success with intensive lifestyle modifications for her multiple health conditions.   Objective:   Blood pressure 135/82, pulse 87, temperature 98 F (36.7 C), height '5\' 3"'$  (1.6 m), weight 210 lb (95.3 kg), last menstrual period 12/30/2014, SpO2 98 %. Body mass index is 37.2 kg/m.  General: Cooperative, alert, well developed, in no acute distress. HEENT: Conjunctivae and lids unremarkable. Cardiovascular: Regular rhythm.  Lungs: Normal work of breathing. Neurologic: No focal deficits.   Lab Results  Component Value Date   CREATININE 1.04 (H) 11/12/2021   BUN 22 11/12/2021   NA 141 11/12/2021   K 4.1 11/12/2021   CL 101 11/12/2021   CO2 25 11/12/2021   Lab Results  Component Value Date   ALT 34 (H) 11/12/2021   AST 35 11/12/2021   ALKPHOS 116 11/12/2021   BILITOT 0.5 11/12/2021   Lab Results  Component Value Date   HGBA1C 5.1 11/12/2021   HGBA1C 5.4 10/17/2012   Lab Results  Component Value Date   INSULIN 15.6 11/12/2021   Lab Results  Component Value Date   TSH 0.990 11/12/2021   Lab Results  Component Value Date   CHOL 198 11/12/2021   HDL 52 11/12/2021   LDLCALC 113 (H) 11/12/2021   TRIG 192 (H) 11/12/2021   CHOLHDL  3.4 10/17/2012   Lab Results  Component Value Date   VD25OH 37.4 11/12/2021   VD25OH 55.4 02/23/2021   VD25OH 33.8 02/12/2019   Lab Results  Component Value Date   WBC 3.1 (L) 11/12/2021   HGB 14.3 11/12/2021   HCT 42.6 11/12/2021   MCV 92 11/12/2021   PLT 222 11/12/2021   Lab Results  Component Value Date   IRON 26 (L) 10/17/2012   TIBC 440 10/17/2012   FERRITIN 4 (L) 10/17/2012   Obesity Behavioral Intervention:   Approximately 15 minutes were spent on the discussion  below.  ASK: We discussed the diagnosis of obesity with Hilda Blades today and Hilda Blades agreed to give [place] permission to discuss obesity behavioral modification therapy today.  ASSESS: Hilda Blades has the diagnosis of obesity and her BMI today is 37.2. Hilda Blades is in the action stage of change.   ADVISE: Hilda Blades was educated on the multiple health risks of obesity as well as the benefit of weight loss to improve her health. She was advised of the need for long term treatment and the importance of lifestyle modifications to improve her current health and to decrease her risk of future health problems.  AGREE: Multiple dietary modification options and treatment options were discussed and Hilda Blades agreed to follow the recommendations documented in the above note.  ARRANGE: Hilda Blades was educated on the importance of frequent visits to treat obesity as outlined per CMS and USPSTF guidelines and agreed to schedule her next follow up appointment today.  Attestation Statements:   Reviewed by clinician on day of visit: allergies, medications, problem list, medical history, surgical history, family history, social history, and previous encounter notes.  I, Elnora Morrison, RMA am acting as transcriptionist for Coralie Common, MD. I have reviewed the above documentation for accuracy and completeness, and I agree with the above. - Coralie Common, MD

## 2022-02-25 ENCOUNTER — Encounter (INDEPENDENT_AMBULATORY_CARE_PROVIDER_SITE_OTHER): Payer: Self-pay | Admitting: Family Medicine

## 2022-02-25 ENCOUNTER — Ambulatory Visit (INDEPENDENT_AMBULATORY_CARE_PROVIDER_SITE_OTHER): Payer: Medicare HMO | Admitting: Family Medicine

## 2022-02-25 VITALS — BP 131/76 | HR 80 | Temp 97.8°F | Ht 63.0 in | Wt 209.0 lb

## 2022-02-25 DIAGNOSIS — E669 Obesity, unspecified: Secondary | ICD-10-CM

## 2022-02-25 DIAGNOSIS — E7849 Other hyperlipidemia: Secondary | ICD-10-CM | POA: Diagnosis not present

## 2022-02-25 DIAGNOSIS — E559 Vitamin D deficiency, unspecified: Secondary | ICD-10-CM

## 2022-02-25 DIAGNOSIS — Z6837 Body mass index (BMI) 37.0-37.9, adult: Secondary | ICD-10-CM | POA: Diagnosis not present

## 2022-02-25 MED ORDER — VITAMIN D (ERGOCALCIFEROL) 1.25 MG (50000 UNIT) PO CAPS: 50000.0000 [IU] | ORAL_CAPSULE | ORAL | 0 refills | Status: DC

## 2022-03-04 NOTE — Progress Notes (Signed)
Chief Complaint:   OBESITY Raven Montoya is here to discuss her progress with her obesity treatment plan along with follow-up of her obesity related diagnoses. Raven Montoya is on the Category 3 Plan and states she is following her eating plan approximately 50% of the time. Raven Montoya states she is walking 15-20 minutes 5 times per week.  Today's visit was #: 6 Starting weight: 223 lbs Starting date: 11/12/2021 Today's weight: 209 lbs Today's date: 02/25/2022 Total lbs lost to date: 14 lbs Total lbs lost since last in-office visit: 1  Interim History: Raven Montoya had quite a holiday.  Recently decreased MS medications to every other day.  Feels some anxiety with change in medications.  She had gotten off plan during the holidays and got back on plan after the season ended.  No obstacles for the next few weeks.  Subjective:   1. Vitamin D deficiency Raven Montoya is currently taking prescription Vit D 50,000 IU once a week.  Denies any nausea, vomiting or muscle weakness.  She notes fatigue.  2. Other hyperlipidemia Last LDL slightly above goal.  Not on medications.  Assessment/Plan:   1. Vitamin D deficiency We will refill Vit D 50K IU once a week for 1 month with 0 refills.  -Refill Vitamin D, Ergocalciferol, (DRISDOL) 1.25 MG (50000 UNIT) CAPS capsule; Take 1 capsule (50,000 Units total) by mouth every 7 (seven) days.  Dispense: 4 capsule; Refill: 0  2. Other hyperlipidemia Will repeat labs in 1 month.  3. Obesity with current BMI of 37.1 Raven Montoya is currently in the action stage of change. As such, her goal is to continue with weight loss efforts. She has agreed to the Category 3 Plan.   Exercise goals: All adults should avoid inactivity. Some physical activity is better than none, and adults who participate in any amount of physical activity gain some health benefits.  Behavioral modification strategies: increasing lean protein intake, meal planning and cooking strategies, and keeping healthy  foods in the home.  Raven Montoya has agreed to follow-up with our clinic in 3 weeks. She was informed of the importance of frequent follow-up visits to maximize her success with intensive lifestyle modifications for her multiple health conditions.   Objective:   Blood pressure 131/76, pulse 80, temperature 97.8 F (36.6 C), height '5\' 3"'$  (1.6 m), weight 209 lb (94.8 kg), last menstrual period 12/30/2014, SpO2 100 %. Body mass index is 37.02 kg/m.  General: Cooperative, alert, well developed, in no acute distress. HEENT: Conjunctivae and lids unremarkable. Cardiovascular: Regular rhythm.  Lungs: Normal work of breathing. Neurologic: No focal deficits.   Lab Results  Component Value Date   CREATININE 1.04 (H) 11/12/2021   BUN 22 11/12/2021   NA 141 11/12/2021   K 4.1 11/12/2021   CL 101 11/12/2021   CO2 25 11/12/2021   Lab Results  Component Value Date   ALT 34 (H) 11/12/2021   AST 35 11/12/2021   ALKPHOS 116 11/12/2021   BILITOT 0.5 11/12/2021   Lab Results  Component Value Date   HGBA1C 5.1 11/12/2021   HGBA1C 5.4 10/17/2012   Lab Results  Component Value Date   INSULIN 15.6 11/12/2021   Lab Results  Component Value Date   TSH 0.990 11/12/2021   Lab Results  Component Value Date   CHOL 198 11/12/2021   HDL 52 11/12/2021   LDLCALC 113 (H) 11/12/2021   TRIG 192 (H) 11/12/2021   CHOLHDL 3.4 10/17/2012   Lab Results  Component Value Date   VD25OH  37.4 11/12/2021   VD25OH 55.4 02/23/2021   VD25OH 33.8 02/12/2019   Lab Results  Component Value Date   WBC 3.1 (L) 11/12/2021   HGB 14.3 11/12/2021   HCT 42.6 11/12/2021   MCV 92 11/12/2021   PLT 222 11/12/2021   Lab Results  Component Value Date   IRON 26 (L) 10/17/2012   TIBC 440 10/17/2012   FERRITIN 4 (L) 10/17/2012   Attestation Statements:   Reviewed by clinician on day of visit: allergies, medications, problem list, medical history, surgical history, family history, social history, and previous  encounter notes.  I, Elnora Morrison, RMA am acting as transcriptionist for Coralie Common, MD.  I have reviewed the above documentation for accuracy and completeness, and I agree with the above. - Coralie Common, MD

## 2022-03-11 ENCOUNTER — Ambulatory Visit (INDEPENDENT_AMBULATORY_CARE_PROVIDER_SITE_OTHER): Payer: Medicare HMO | Admitting: Adult Health

## 2022-03-11 ENCOUNTER — Encounter (INDEPENDENT_AMBULATORY_CARE_PROVIDER_SITE_OTHER): Payer: Self-pay | Admitting: Adult Health

## 2022-03-11 VITALS — BP 135/83 | HR 81 | Temp 98.3°F | Ht 63.0 in | Wt 209.0 lb

## 2022-03-11 DIAGNOSIS — E782 Mixed hyperlipidemia: Secondary | ICD-10-CM

## 2022-03-11 DIAGNOSIS — G35 Multiple sclerosis: Secondary | ICD-10-CM | POA: Diagnosis not present

## 2022-03-11 DIAGNOSIS — R7401 Elevation of levels of liver transaminase levels: Secondary | ICD-10-CM

## 2022-03-11 DIAGNOSIS — F3289 Other specified depressive episodes: Secondary | ICD-10-CM | POA: Diagnosis not present

## 2022-03-11 DIAGNOSIS — E669 Obesity, unspecified: Secondary | ICD-10-CM

## 2022-03-11 DIAGNOSIS — Z6837 Body mass index (BMI) 37.0-37.9, adult: Secondary | ICD-10-CM

## 2022-03-13 ENCOUNTER — Other Ambulatory Visit: Payer: Self-pay | Admitting: Neurology

## 2022-03-15 NOTE — Telephone Encounter (Signed)
Follow up scheduled on 06/24/22

## 2022-03-16 ENCOUNTER — Other Ambulatory Visit: Payer: Self-pay | Admitting: Neurology

## 2022-03-16 NOTE — Telephone Encounter (Signed)
Follow up scheduled 06/24/22 No mention of Rx taken in recent office note Note on Rx stating "The original prescription was discontinued on 11/12/2021 by Marcille Blanco for the following reason: Patient Preference. Renewing this prescription may not be appropriate. "

## 2022-03-24 DIAGNOSIS — R7401 Elevation of levels of liver transaminase levels: Secondary | ICD-10-CM | POA: Insufficient documentation

## 2022-03-24 DIAGNOSIS — E782 Mixed hyperlipidemia: Secondary | ICD-10-CM | POA: Insufficient documentation

## 2022-03-24 NOTE — Progress Notes (Signed)
Chief Complaint:   OBESITY Raven Montoya is here to discuss her progress with her obesity treatment plan along with follow-up of her obesity related diagnoses. Raven Montoya is on the Category 3 Plan and states she is following her eating plan approximately 75% of the time. Raven Montoya states she is walking 20 minutes 7 times per week.  Today's visit was #: 7 Starting weight: 50 LBS Starting date: 11/12/2021 Today's weight: 209 LBS Today's date: 03/11/2022 Total lbs lost to date: 14 LBS Total lbs lost since last in-office visit: 0  Interim History:  Raven Montoya reports being dx'd with Multiple Sclerosis (MS) at age 8, she is currently age 62. She endorses the following MS sx's: Fatigue Brain fog Lower extremity weakness Gait disturbances  She has one adult son, Raven Montoya, whom lives with her.  Subjective:   1. Transaminitis Patient denies RUQ pain.  2. MS (multiple sclerosis) [facility]) Neurology closely follows Ms. [name].   12/24/2021 Gilenya 0.5 mg decreased from daily to every other day.   PDMP reviewed- no aberrancies noted. Patient has increased depression symptoms, positive for tearfulness.   Patient denies suicidal homicidal ideations.  3. Mixed hyperlipidemia Lipid Panel     Component Value Date/Time   CHOL 198 11/12/2021 0928   TRIG 192 (H) 11/12/2021 0928   HDL 52 11/12/2021 0928   CHOLHDL 3.4 10/17/2012 1146   VLDL 47 (H) 10/17/2012 1146   LDLCALC 113 (H) 11/12/2021 0928   LABVLDL 33 11/12/2021 0928    The 10-year ASCVD risk score (Arnett DK, et al., 2019) is: 3.3%   Patient denies tobacco or vaping use.  4. Other depression Patient is taking Cymbalta 60 mg daily.  12/24/2021 Gilenya 0.5 mg decreased from daily to every other day.   Patient has increased depression symptoms, positive for tearfulness.    Assessment/Plan:   1. Transaminitis Check labs at next office visit.  2. MS (multiple sclerosis) ([place]) Referral to behavioral health/psychology for increased  depression sx's. Follow-up with neurology every 6 months.  3. Mixed hyperlipidemia Check fasting lipid panel at next office visit.  4. Other depression Referral to behavioral health/psychology.  Referral- Ambulatory referral to Psychology  5. Obesity with current BMI of 37.2 Raven Montoya is currently in the action stage of change. As such, her goal is to continue with weight loss efforts. She has agreed to the Category 3 Plan.   Exercise goals:  As is.  Behavioral modification strategies: increasing lean protein intake, decreasing simple carbohydrates, meal planning and cooking strategies, keeping healthy foods in the home, and planning for success.  Otha has agreed to follow-up with our clinic in 3 weeks. She was informed of the importance of frequent follow-up visits to maximize her success with intensive lifestyle modifications for her multiple health conditions.   Objective:   Blood pressure 135/83, pulse 81, temperature 98.3 F (36.8 C), height 5' 3"$  (1.6 m), weight 209 lb (94.8 kg), last menstrual period 12/30/2014, SpO2 97 %. Body mass index is 37.02 kg/m.  General: Cooperative, alert, well developed, in no acute distress. HEENT: Conjunctivae and lids unremarkable. Cardiovascular: Regular rhythm.  Lungs: Normal work of breathing. Neurologic: No focal deficits.   Lab Results  Component Value Date   CREATININE 1.04 (H) 11/12/2021   BUN 22 11/12/2021   NA 141 11/12/2021   K 4.1 11/12/2021   CL 101 11/12/2021   CO2 25 11/12/2021   Lab Results  Component Value Date   ALT 34 (H) 11/12/2021   AST 35 11/12/2021  ALKPHOS 116 11/12/2021   BILITOT 0.5 11/12/2021   Lab Results  Component Value Date   HGBA1C 5.1 11/12/2021   HGBA1C 5.4 10/17/2012   Lab Results  Component Value Date   INSULIN 15.6 11/12/2021   Lab Results  Component Value Date   TSH 0.990 11/12/2021   Lab Results  Component Value Date   CHOL 198 11/12/2021   HDL 52 11/12/2021   LDLCALC 113  (H) 11/12/2021   TRIG 192 (H) 11/12/2021   CHOLHDL 3.4 10/17/2012   Lab Results  Component Value Date   VD25OH 37.4 11/12/2021   VD25OH 55.4 02/23/2021   VD25OH 33.8 02/12/2019   Lab Results  Component Value Date   WBC 3.1 (L) 11/12/2021   HGB 14.3 11/12/2021   HCT 42.6 11/12/2021   MCV 92 11/12/2021   PLT 222 11/12/2021   Lab Results  Component Value Date   IRON 26 (L) 10/17/2012   TIBC 440 10/17/2012   FERRITIN 4 (L) 10/17/2012   Attestation Statements:   Reviewed by clinician on day of visit: allergies, medications, problem list, medical history, surgical history, family history, social history, and previous encounter notes.  I, Davy Pique, RMA, am acting as Location manager for Mina Marble, NP.  I have reviewed the above documentation for accuracy and completeness, and I agree with the above. -  Aymee Fomby d. Danilyn Cocke, NP-C

## 2022-03-31 ENCOUNTER — Encounter (INDEPENDENT_AMBULATORY_CARE_PROVIDER_SITE_OTHER): Payer: Self-pay | Admitting: Physician Assistant

## 2022-03-31 ENCOUNTER — Ambulatory Visit (INDEPENDENT_AMBULATORY_CARE_PROVIDER_SITE_OTHER): Payer: Medicare HMO | Admitting: Physician Assistant

## 2022-03-31 VITALS — BP 132/84 | HR 80 | Temp 98.6°F | Ht 63.0 in | Wt 205.0 lb

## 2022-03-31 DIAGNOSIS — E559 Vitamin D deficiency, unspecified: Secondary | ICD-10-CM

## 2022-03-31 DIAGNOSIS — E782 Mixed hyperlipidemia: Secondary | ICD-10-CM

## 2022-03-31 DIAGNOSIS — R7401 Elevation of levels of liver transaminase levels: Secondary | ICD-10-CM | POA: Diagnosis not present

## 2022-03-31 DIAGNOSIS — Z6836 Body mass index (BMI) 36.0-36.9, adult: Secondary | ICD-10-CM

## 2022-03-31 DIAGNOSIS — E669 Obesity, unspecified: Secondary | ICD-10-CM

## 2022-03-31 DIAGNOSIS — E88819 Insulin resistance, unspecified: Secondary | ICD-10-CM | POA: Diagnosis not present

## 2022-03-31 DIAGNOSIS — R5383 Other fatigue: Secondary | ICD-10-CM

## 2022-03-31 NOTE — Progress Notes (Signed)
Chief Complaint:   OBESITY Cayle is here to discuss her progress with her obesity treatment plan along with follow-up of her obesity related diagnoses. Jemimah is on the Category 3 Plan and states she is following her eating plan approximately 98% of the time. Brithany states she is walking 20 minutes 5 times per week.  Today's visit was #: 8 Starting weight: 223 lbs Starting date: 11/12/2021 Today's weight: 205 lbs Today's date: 03/31/2022 Total lbs lost to date: 18 lbs Total lbs lost since last in-office visit: 4 lbs  Interim History: Freada Bergeron has done well with weight loss.  She has good adherence to her nutrition plan.  Hunger and appetite controlled but notes some craving of sweets after dinner.  We discussed better snacking choices like [employer]ogurt bars.  She reports her Neurologist has been able to decrease her MS medications and she is doing well on current medications. She reports some ongoing fatigue.  She is trying to exercise more and her son is working at a gym now and also following a Camera operator.   Subjective:   1. Insulin resistance A1c 5.1/ Insulin 15.6 11/12/2021- Insulin not at goal. She has been working on nutrition plan to decrease simple carbohydrates, increase lean protein and exercise to promote weight loss, improve insulin resistance and glycemic control.  Check labs today.  2. Transaminitis She has had mild elevations of her ALT 34 on 11/12/21.Previous CT ABD in 2017 showed slightly enlarged liver but no focal liver abnormality. She is asymptomatic. Denies any abdominal pain or other symptoms.  Recheck labs today.  3. Vitamin D deficiency On Ergocalciferol 50,000 once weekly. No side effects with ergocalciferol.  Vit D level 37.4 11/12/21- Not at goal   4. Mixed hyperlipidemia  HDL 52- at goal/ Triglycerides 192/ LDL 113- not at goals.  She is working on Camera operator to decrease simple carbohydrates, and decrease saturated fats and cholesterol in  diet to promote weight loss and improve glycemic control.  Check labs today.   5. Other fatigue Endorses fatigue which she feels is related to MS. No recent B12 or TSH. Check labs today.   6. Obesity- Start BMI 39.5   7. BMI 36.0-36.9,adult, Current BMI 36.4    Assessment/Plan:   1. Insulin resistance Continues work on nutrition plan to decrease simple carbohydrates, increase lean protein and exercise to promote weight loss, improve insulin resistance and prevent progression to pre diabetes or Type 2 diabetes.  Check labs today:  - CMP14+EGFR - Hemoglobin A1c - Insulin, random  2. Transaminitis Check labs today. May need follow up imaging at some point to re-evaluate liver.  - CMP14+EGFR  3. Vitamin D deficiency Continue Ergocalciferol once weekly. Does not need refill today.  Check level today - VITAMIN D 25 Hydroxy (Vit-D Deficiency, Fractures)  4. Mixed hyperlipidemia Fasting for follow up labs today. No medications . Working on decreasing saturated fats and cholesterol in diet to promote weight loss and improve lipid profile.  - Lipid Panel With LDL/HDL Ratio  5. Other fatigue Check labs today.  - Vitamin B12 - TSH - CBC with Differential/Platelet  6. Obesity- Start BMI 39.5   7. BMI 36.0-36.9,adult, Current BMI 36.4   Shenika is currently in the action stage of change. As such, her goal is to continue with weight loss efforts. She has agreed to the Category 3 Plan.   Exercise goals: All adults should avoid inactivity. Some physical activity is better than none, and adults who  participate in any amount of physical activity gain some health benefits.  Behavioral modification strategies: increasing lean protein intake, decreasing simple carbohydrates, increasing high fiber foods, better snacking choices, and planning for success.  Halcyon has agreed to follow-up with our clinic in 3 weeks. She was informed of the importance of frequent follow-up visits to  maximize her success with intensive lifestyle modifications for her multiple health conditions.  Laferne was informed we would discuss her lab results at her next visit unless there is a critical issue that needs to be addressed sooner. Arienne agreed to keep her next visit at the agreed upon time to discuss these results.  Objective:   Blood pressure 132/84, pulse 80, temperature 98.6 F (37 C), height 5' 3"$  (1.6 m), weight 205 lb (93 kg), last menstrual period 12/30/2014, SpO2 98 %. Body mass index is 36.31 kg/m.  General: Cooperative, alert, well developed, in no acute distress. HEENT: Conjunctivae and lids unremarkable. Cardiovascular: Regular rhythm.  Lungs: Normal work of breathing. Neurologic: No focal deficits.   Lab Results  Component Value Date   CREATININE 1.04 (H) 11/12/2021   BUN 22 11/12/2021   NA 141 11/12/2021   K 4.1 11/12/2021   CL 101 11/12/2021   CO2 25 11/12/2021   Lab Results  Component Value Date   ALT 34 (H) 11/12/2021   AST 35 11/12/2021   ALKPHOS 116 11/12/2021   BILITOT 0.5 11/12/2021   Lab Results  Component Value Date   HGBA1C 5.1 11/12/2021   HGBA1C 5.4 10/17/2012   Lab Results  Component Value Date   INSULIN 15.6 11/12/2021   Lab Results  Component Value Date   TSH 0.990 11/12/2021   Lab Results  Component Value Date   CHOL 198 11/12/2021   HDL 52 11/12/2021   LDLCALC 113 (H) 11/12/2021   TRIG 192 (H) 11/12/2021   CHOLHDL 3.4 10/17/2012   Lab Results  Component Value Date   VD25OH 37.4 11/12/2021   VD25OH 55.4 02/23/2021   VD25OH 33.8 02/12/2019   Lab Results  Component Value Date   WBC 3.1 (L) 11/12/2021   HGB 14.3 11/12/2021   HCT 42.6 11/12/2021   MCV 92 11/12/2021   PLT 222 11/12/2021   Lab Results  Component Value Date   IRON 26 (L) 10/17/2012   TIBC 440 10/17/2012   FERRITIN 4 (L) 10/17/2012    Obesity Behavioral Intervention:   Approximately 15 minutes were spent on the discussion below.  ASK: We  discussed the diagnosis of obesity with Earlie Server today and Alsion agreed to give [place] permission to discuss obesity behavioral modification therapy today.  ASSESS: Ehlana has the diagnosis of obesity and her BMI today is 36.4. Keiya is in the action stage of change.   ADVISE: Akiko was educated on the multiple health risks of obesity as well as the benefit of weight loss to improve her health. She was advised of the need for long term treatment and the importance of lifestyle modifications to improve her current health and to decrease her risk of future health problems.  AGREE: Multiple dietary modification options and treatment options were discussed and Sharifah agreed to follow the recommendations documented in the above note.  ARRANGE: Nakiesha was educated on the importance of frequent visits to treat obesity as outlined per CMS and USPSTF guidelines and agreed to schedule her next follow up appointment today.  Attestation Statements:   Reviewed by clinician on day of visit: allergies, medications, problem list, medical history, surgical history, family  history, social history, and previous encounter notes.  Cass Vandermeulen,PA-C

## 2022-04-01 ENCOUNTER — Other Ambulatory Visit (INDEPENDENT_AMBULATORY_CARE_PROVIDER_SITE_OTHER): Payer: Self-pay | Admitting: Physician Assistant

## 2022-04-01 ENCOUNTER — Telehealth (INDEPENDENT_AMBULATORY_CARE_PROVIDER_SITE_OTHER): Payer: Self-pay | Admitting: Physician Assistant

## 2022-04-01 LAB — CMP14+EGFR
ALT: 21 IU/L (ref 0–32)
AST: 24 IU/L (ref 0–40)
Albumin/Globulin Ratio: 2.2 (ref 1.2–2.2)
Albumin: 5 g/dL — ABNORMAL HIGH (ref 3.8–4.9)
Alkaline Phosphatase: 119 IU/L (ref 44–121)
BUN/Creatinine Ratio: 21 (ref 9–23)
BUN: 30 mg/dL — ABNORMAL HIGH (ref 6–24)
Bilirubin Total: 0.7 mg/dL (ref 0.0–1.2)
CO2: 28 mmol/L (ref 20–29)
Calcium: 9.5 mg/dL (ref 8.7–10.2)
Chloride: 90 mmol/L — ABNORMAL LOW (ref 96–106)
Creatinine, Ser: 1.43 mg/dL — ABNORMAL HIGH (ref 0.57–1.00)
Globulin, Total: 2.3 g/dL (ref 1.5–4.5)
Glucose: 89 mg/dL (ref 70–99)
Potassium: 3.4 mmol/L — ABNORMAL LOW (ref 3.5–5.2)
Sodium: 144 mmol/L (ref 134–144)
Total Protein: 7.3 g/dL (ref 6.0–8.5)
eGFR: 42 mL/min/{1.73_m2} — ABNORMAL LOW (ref >59)

## 2022-04-01 LAB — VITAMIN B12: Vitamin B-12: 547 pg/mL (ref 232–1245)

## 2022-04-01 LAB — LIPID PANEL WITH LDL/HDL RATIO
Cholesterol, Total: 209 mg/dL — ABNORMAL HIGH (ref 100–199)
HDL: 59 mg/dL (ref >39)
LDL Chol Calc (NIH): 118 mg/dL — ABNORMAL HIGH (ref 0–99)
LDL/HDL Ratio: 2 ratio (ref 0.0–3.2)
Triglycerides: 184 mg/dL — ABNORMAL HIGH (ref 0–149)
VLDL Cholesterol Cal: 32 mg/dL (ref 5–40)

## 2022-04-01 LAB — CBC WITH DIFFERENTIAL/PLATELET
Basophils Absolute: 0 10*3/uL (ref 0.0–0.2)
Basos: 0 %
EOS (ABSOLUTE): 0.2 10*3/uL (ref 0.0–0.4)
Eos: 5 %
Hematocrit: 46.2 % (ref 34.0–46.6)
Hemoglobin: 15.5 g/dL (ref 11.1–15.9)
Immature Grans (Abs): 0 10*3/uL (ref 0.0–0.1)
Immature Granulocytes: 1 %
Lymphocytes Absolute: 0.3 10*3/uL — ABNORMAL LOW (ref 0.7–3.1)
Lymphs: 7 %
MCH: 31.1 pg (ref 26.6–33.0)
MCHC: 33.5 g/dL (ref 31.5–35.7)
MCV: 93 fL (ref 79–97)
Monocytes Absolute: 0.5 10*3/uL (ref 0.1–0.9)
Monocytes: 11 %
Neutrophils Absolute: 3.3 10*3/uL (ref 1.4–7.0)
Neutrophils: 76 %
Platelets: 252 10*3/uL (ref 150–450)
RBC: 4.98 x10E6/uL (ref 3.77–5.28)
RDW: 13.7 % (ref 11.7–15.4)
WBC: 4.3 10*3/uL (ref 3.4–10.8)

## 2022-04-01 LAB — VITAMIN D 25 HYDROXY (VIT D DEFICIENCY, FRACTURES): Vit D, 25-Hydroxy: 107 ng/mL — ABNORMAL HIGH (ref 30.0–100.0)

## 2022-04-01 LAB — HEMOGLOBIN A1C
Est. average glucose Bld gHb Est-mCnc: 94 mg/dL
Hgb A1c MFr Bld: 4.9 % (ref 4.8–5.6)

## 2022-04-01 LAB — TSH: TSH: 1.95 u[IU]/mL (ref 0.450–4.500)

## 2022-04-01 LAB — INSULIN, RANDOM: INSULIN: 10.1 u[IU]/mL (ref 2.6–24.9)

## 2022-04-01 NOTE — Telephone Encounter (Signed)
Labs showed Vit D level of 107- Patient will be instructed to stop all vitamin D and will plan to follow up in 1-2 months.  Also, renal function is slightly elevated. Instructed to hydrate well. Potassium is also slightly low and will plan to recheck labs either by PCP or here to follow up .  Will discuss other labs at length next visit.   Remer Couse,PA-C

## 2022-04-01 NOTE — Telephone Encounter (Signed)
Patient called office back concerning her lab results and we spoke at length about : Labs showed Vit D level of 107- Patient was instructed to stop all vitamin D and will plan to follow up in 1-2 months.  Also, renal function is slightly elevated. She was instructed to hydrate well, drink at least 64 oz of water daily. Potassium is also slightly low . She has recently had some difficulty with kidney stones and we discussed she should see her PCP today or tomorrow for repeat of labs and follow up  and she is going to [facility] for follow up.   Dionel Archey,PA-C

## 2022-04-20 ENCOUNTER — Encounter: Payer: Self-pay | Admitting: Neurology

## 2022-04-21 ENCOUNTER — Other Ambulatory Visit: Payer: Self-pay | Admitting: Neurology

## 2022-04-21 MED ORDER — CYCLOBENZAPRINE HCL 10 MG PO TABS: 10.0000 mg | ORAL_TABLET | Freq: Every evening | ORAL | 3 refills | Status: DC | PRN

## 2022-04-22 ENCOUNTER — Ambulatory Visit (INDEPENDENT_AMBULATORY_CARE_PROVIDER_SITE_OTHER): Payer: Medicare HMO | Admitting: Physician Assistant

## 2022-04-28 ENCOUNTER — Encounter (INDEPENDENT_AMBULATORY_CARE_PROVIDER_SITE_OTHER): Payer: Self-pay | Admitting: Physician Assistant

## 2022-04-28 ENCOUNTER — Ambulatory Visit (INDEPENDENT_AMBULATORY_CARE_PROVIDER_SITE_OTHER): Payer: Medicare HMO | Admitting: Physician Assistant

## 2022-04-28 VITALS — BP 122/82 | HR 74 | Temp 97.8°F | Ht 63.0 in | Wt 212.0 lb

## 2022-04-28 DIAGNOSIS — E669 Obesity, unspecified: Secondary | ICD-10-CM | POA: Diagnosis not present

## 2022-04-28 DIAGNOSIS — E88819 Insulin resistance, unspecified: Secondary | ICD-10-CM | POA: Diagnosis not present

## 2022-04-28 DIAGNOSIS — E559 Vitamin D deficiency, unspecified: Secondary | ICD-10-CM | POA: Diagnosis not present

## 2022-04-28 DIAGNOSIS — Z9189 Other specified personal risk factors, not elsewhere classified: Secondary | ICD-10-CM

## 2022-04-28 DIAGNOSIS — Z6837 Body mass index (BMI) 37.0-37.9, adult: Secondary | ICD-10-CM | POA: Diagnosis not present

## 2022-04-28 DIAGNOSIS — N289 Disorder of kidney and ureter, unspecified: Secondary | ICD-10-CM | POA: Insufficient documentation

## 2022-04-28 MED ORDER — POTASSIUM CHLORIDE CRYS ER 20 MEQ PO TBCR: 20.0000 meq | EXTENDED_RELEASE_TABLET | Freq: Every day | ORAL | 3 refills | Status: DC

## 2022-04-28 MED ORDER — FUROSEMIDE 20 MG PO TABS: 20.0000 mg | ORAL_TABLET | Freq: Every day | ORAL | 3 refills | Status: DC

## 2022-04-28 NOTE — Progress Notes (Signed)
Office: [phone]  /  Fax: [phone]  WEIGHT SUMMARY AND BIOMETRICS  Vitals Temp: 97.8 F (36.6 C) BP: 122/82 Pulse Rate: 74 SpO2: 96 %   Anthropometric Measurements Height: 5\' 3"  (1.6 m) Weight: 212 lb (96.2 kg) BMI (Calculated): 37.56 Weight at Last Visit: 205 lb Weight Lost Since Last Visit: 0 lb Weight Gained Since Last Visit: 7 lb Starting Weight: 223 lb Total Weight Loss (lbs): 18 lb (8.165 kg)   Body Composition  Body Fat %: 47.3 % Fat Mass (lbs): 100.4 lbs Muscle Mass (lbs): 106.2 lbs Total Body Water (lbs): 81.6 lbs Visceral Fat Rating : 14   Other Clinical Data Fasting: Yes Labs: No Today's Visit #: 9 Starting Date: 11/12/21     HPI  Chief Complaint: OBESITY  Raven Montoya is here to discuss her progress with her obesity treatment plan. She is on the the Category 3 Plan and states she is following her eating plan approximately 70 % of the time. She states she is exercising/walking 20 minutes 7 times per week.   Interval History:  Since last office visit she is up 6.8 lbs total water. She does feel bloated/puffy, but no SOB, no chest pain or palpitations or other symptoms/concerning signs. Saw PCP who worked up for UTI and work up negative for UTI. No additional labs done, so will recheck today.  Reports otherwise feeling well.  Some increased stress with death of a friend, but good support of boyfriend/family.  Hunger controlled overall.  Walking regularly and daily for exercise.    PHYSICAL EXAM:  Blood pressure 122/82, pulse 74, temperature 97.8 F (36.6 C), height 5\' 3"  (1.6 m), weight 212 lb (96.2 kg), last menstrual period 12/30/2014, SpO2 96 %. Body mass index is 37.55 kg/m.  General: She is overweight, cooperative, alert, well developed, and in no acute distress. PSYCH: Has normal mood, affect and thought process.   Lungs: Normal breathing effort, no conversational dyspnea.  DIAGNOSTIC DATA REVIEWED:  BMET    Component Value  Date/Time   NA 144 03/31/2022 1223   NA 143 02/18/2016 1247   K 3.4 (L) 03/31/2022 1223   K 3.9 02/18/2016 1247   CL 90 (L) 03/31/2022 1223   CO2 28 03/31/2022 1223   CO2 27 02/18/2016 1247   GLUCOSE 89 03/31/2022 1223   GLUCOSE 101 (H) 08/02/2017 1710   GLUCOSE 83 02/18/2016 1247   BUN 30 (H) 03/31/2022 1223   BUN 18.8 02/18/2016 1247   CREATININE 1.43 (H) 03/31/2022 1223   CREATININE 0.8 02/18/2016 1247   CALCIUM 9.5 03/31/2022 1223   CALCIUM 9.5 02/18/2016 1247   GFRNONAA 46 (L) 02/19/2020 1143   GFRAA 53 (L) 02/19/2020 1143   Lab Results  Component Value Date   HGBA1C 4.9 03/31/2022   HGBA1C 5.4 10/17/2012   Lab Results  Component Value Date   INSULIN 10.1 03/31/2022   INSULIN 15.6 11/12/2021   Lab Results  Component Value Date   TSH 1.950 03/31/2022   CBC    Component Value Date/Time   WBC 4.3 03/31/2022 1223   WBC 4.9 08/02/2017 1710   RBC 4.98 03/31/2022 1223   RBC 4.53 08/02/2017 1710   HGB 15.5 03/31/2022 1223   HGB 14.5 02/18/2016 1247   HCT 46.2 03/31/2022 1223   HCT 42.9 02/18/2016 1247   PLT 252 03/31/2022 1223   MCV 93 03/31/2022 1223   MCV 93.2 02/18/2016 1247   MCH 31.1 03/31/2022 1223   MCH 30.7 08/02/2017 1710   MCHC 33.5  03/31/2022 1223   MCHC 33.1 08/02/2017 1710   RDW 13.7 03/31/2022 1223   RDW 14.2 02/18/2016 1247   Iron Studies    Component Value Date/Time   IRON 26 (L) 10/17/2012 1146   TIBC 440 10/17/2012 1146   FERRITIN 4 (L) 10/17/2012 1146   IRONPCTSAT 6 (L) 10/17/2012 1146   Lipid Panel     Component Value Date/Time   CHOL 209 (H) 03/31/2022 1223   TRIG 184 (H) 03/31/2022 1223   HDL 59 03/31/2022 1223   CHOLHDL 3.4 10/17/2012 1146   VLDL 47 (H) 10/17/2012 1146   LDLCALC 118 (H) 03/31/2022 1223   Hepatic Function Panel     Component Value Date/Time   PROT 7.3 03/31/2022 1223   PROT 7.2 02/18/2016 1247   ALBUMIN 5.0 (H) 03/31/2022 1223   ALBUMIN 4.2 02/18/2016 1247   AST 24 03/31/2022 1223   AST 31 02/18/2016  1247   ALT 21 03/31/2022 1223   ALT 45 02/18/2016 1247   ALKPHOS 119 03/31/2022 1223   ALKPHOS 109 02/18/2016 1247   BILITOT 0.7 03/31/2022 1223   BILITOT 0.80 02/18/2016 1247   BILIDIR 0.19 01/08/2015 1029      Component Value Date/Time   TSH 1.950 03/31/2022 1223   Nutritional Lab Results  Component Value Date   VD25OH 107.0 (H) 03/31/2022   VD25OH 37.4 11/12/2021   VD25OH 55.4 02/23/2021    ASSOCIATED CONDITIONS ADDRESSED TODAY  ASSESSMENT AND PLAN  Problem List Items Addressed This Visit     Insulin resistance - Primary    Insulin Resistance Last fasting insulin was 10.1. A1c was 4.9. Polyphagia:No Medication(s): None Lab Results  Component Value Date   HGBA1C 4.9 03/31/2022   HGBA1C 5.1 11/12/2021   HGBA1C 5.4 10/17/2012   Lab Results  Component Value Date   INSULIN 10.1 03/31/2022   INSULIN 15.6 11/12/2021   Plan: Continue working on nutrition plan to decrease simple carbohydrates, increase lean proteins and exercise to promote weight loss, improve glycemic control and prevent progression to Type 2 diabetes.  Increase protein intake. Increase fiber intake.         Vitamin D deficiency     Vitamin D Deficiency Vitamin D is at goal of 50.  Most recent vitamin D level was 107 and the patient was called immediately following result and instructed to discontinue all vitamin D. She is on no vitamin D at this time. She reports feels well, no side effects when taking Vitamin D.       Lab Results  Component Value Date    VD25OH 107.0 (H) 03/31/2022    VD25OH 37.4 11/12/2021    VD25OH 55.4 02/23/2021      Plan: Continue off of all vitamin D supplementation. Will plan to recheck Vit D level in 2- 3 months.        Obesity- Start BMI 39.5   BMI 37.0-37.9, adult Current BMI 37.6   At risk for fluid volume overload    She is up 6.8 lbs in total water and reports feeling bloated, fingers feel puffy.  She saw her PCP for follow up of renal function and  reports had work up for UTI which was negative. She has history of UTI/kidney stones with obstruction requiring stenting last year and we discussed following up with urology as she reports following stent removal she has urinary urgency and voiding never returned to previous state.  Plan:  Repeat labs today. Will call with result if significantly worse.  She is  going to discuss with PCP and work to get back in to see Urology- Dr. Claudia Desanctis.  Plan gently diurese with Lasix and replace K+ over the next week.  May need nephrology referral as well.       Relevant Medications   furosemide (LASIX) 20 MG tablet   potassium chloride SA (KLOR-CON M) 20 MEQ tablet   Other Relevant Orders   CMP14+EGFR      TREATMENT PLAN FOR OBESITY:  Recommended Dietary Goals  Labreeska is currently in the action stage of change. As such, her goal is to continue weight management plan. She has agreed to the Category 2 Plan.  Behavioral Intervention  We discussed the following Behavioral Modification Strategies today: increasing lean protein intake, decreasing simple carbohydrates , increasing vegetables, increasing water intake, work on meal planning and easy cooking plans, planning for success, and keeping healthy foods at home.  Additional resources provided today: NA  Recommended Physical Activity Goals  Salihah has been advised to work up to 150 minutes of moderate intensity aerobic activity a week and strengthening exercises 2-3 times per week for cardiovascular health, weight loss maintenance and preservation of muscle mass.   She has agreed to Continue current level of physical activity    Pharmacotherapy We discussed various medication options to help Neeley with her weight loss efforts and we both agreed to continue nutrition plan.    Return in about 3 weeks (around 05/19/2022).Marland Kitchen She was informed of the importance of frequent follow up visits to maximize her success with intensive lifestyle  modifications for her multiple health conditions.   ATTESTASTION STATEMENTS:  Reviewed by clinician on day of visit: allergies, medications, problem list, medical history, surgical history, family history, social history, and previous encounter notes.   I have personally spent 42 minutes total time today in preparation, patient care, nutritional counseling and documentation for this visit, including the following: review of clinical lab tests; review of medical tests/procedures/services.      Arhaan Chesnut, PA-C

## 2022-04-28 NOTE — Assessment & Plan Note (Addendum)
She is up 6.8 lbs in total water and reports feeling bloated, fingers feel puffy.  She saw her PCP for follow up of renal function and reports had work up for UTI which was negative. She has history of UTI/kidney stones with obstruction requiring stenting last year and we discussed following up with urology as she reports following stent removal she has urinary urgency and voiding never returned to previous state.  Plan:  Repeat labs today. Will call with result if significantly worse.  She is going to discuss with PCP and work to get back in to see Urology- Dr. Claudia Desanctis.  Plan gently diurese with Lasix and replace K+ over the next week.  May need nephrology referral as well.

## 2022-04-28 NOTE — Assessment & Plan Note (Signed)
  Vitamin D Deficiency Vitamin D is at goal of 50.  Most recent vitamin D level was 107 and the patient was called immediately following result and instructed to discontinue all vitamin D. She is on no vitamin D at this time. She reports feels well, no side effects when taking Vitamin D.       Lab Results  Component Value Date    VD25OH 107.0 (H) 03/31/2022    VD25OH 37.4 11/12/2021    VD25OH 55.4 02/23/2021      Plan: Continue off of all vitamin D supplementation. Will plan to recheck Vit D level in 2- 3 months.

## 2022-04-28 NOTE — Assessment & Plan Note (Addendum)
Insulin Resistance Last fasting insulin was 10.1. A1c was 4.9. Polyphagia:No Medication(s): None Lab Results  Component Value Date   HGBA1C 4.9 03/31/2022   HGBA1C 5.1 11/12/2021   HGBA1C 5.4 10/17/2012   Lab Results  Component Value Date   INSULIN 10.1 03/31/2022   INSULIN 15.6 11/12/2021    Plan: Continue working on nutrition plan to decrease simple carbohydrates, increase lean proteins and exercise to promote weight loss, improve glycemic control and prevent progression to Type 2 diabetes.  Increase protein intake. Increase fiber intake.

## 2022-04-29 LAB — CMP14+EGFR
ALT: 44 IU/L — ABNORMAL HIGH (ref 0–32)
AST: 25 IU/L (ref 0–40)
Albumin/Globulin Ratio: 2 (ref 1.2–2.2)
Albumin: 4.5 g/dL (ref 3.8–4.9)
Alkaline Phosphatase: 129 IU/L — ABNORMAL HIGH (ref 44–121)
BUN/Creatinine Ratio: 19 (ref 9–23)
BUN: 21 mg/dL (ref 6–24)
Bilirubin Total: 0.6 mg/dL (ref 0.0–1.2)
CO2: 26 mmol/L (ref 20–29)
Calcium: 9.5 mg/dL (ref 8.7–10.2)
Chloride: 100 mmol/L (ref 96–106)
Creatinine, Ser: 1.09 mg/dL — ABNORMAL HIGH (ref 0.57–1.00)
Globulin, Total: 2.2 g/dL (ref 1.5–4.5)
Glucose: 89 mg/dL (ref 70–99)
Potassium: 4 mmol/L (ref 3.5–5.2)
Sodium: 144 mmol/L (ref 134–144)
Total Protein: 6.7 g/dL (ref 6.0–8.5)
eGFR: 59 mL/min/{1.73_m2} — ABNORMAL LOW (ref >59)

## 2022-05-12 ENCOUNTER — Ambulatory Visit (INDEPENDENT_AMBULATORY_CARE_PROVIDER_SITE_OTHER): Payer: Medicare HMO | Admitting: Physician Assistant

## 2022-05-12 DIAGNOSIS — E559 Vitamin D deficiency, unspecified: Secondary | ICD-10-CM

## 2022-05-12 DIAGNOSIS — Z6837 Body mass index (BMI) 37.0-37.9, adult: Secondary | ICD-10-CM

## 2022-05-12 DIAGNOSIS — Z9189 Other specified personal risk factors, not elsewhere classified: Secondary | ICD-10-CM

## 2022-05-12 DIAGNOSIS — E669 Obesity, unspecified: Secondary | ICD-10-CM

## 2022-05-12 DIAGNOSIS — E88819 Insulin resistance, unspecified: Secondary | ICD-10-CM

## 2022-06-03 ENCOUNTER — Encounter: Payer: Self-pay | Admitting: Neurology

## 2022-06-24 ENCOUNTER — Encounter: Payer: Self-pay | Admitting: Neurology

## 2022-06-24 ENCOUNTER — Ambulatory Visit (INDEPENDENT_AMBULATORY_CARE_PROVIDER_SITE_OTHER): Payer: Medicare HMO | Admitting: Neurology

## 2022-06-24 VITALS — BP 129/79 | HR 70 | Ht 63.0 in | Wt 219.5 lb

## 2022-06-24 DIAGNOSIS — Z79899 Other long term (current) drug therapy: Secondary | ICD-10-CM

## 2022-06-24 DIAGNOSIS — G35 Multiple sclerosis: Secondary | ICD-10-CM | POA: Diagnosis not present

## 2022-06-24 DIAGNOSIS — D72810 Lymphocytopenia: Secondary | ICD-10-CM | POA: Diagnosis not present

## 2022-06-24 DIAGNOSIS — M542 Cervicalgia: Secondary | ICD-10-CM | POA: Diagnosis not present

## 2022-06-24 DIAGNOSIS — R269 Unspecified abnormalities of gait and mobility: Secondary | ICD-10-CM

## 2022-06-24 DIAGNOSIS — F418 Other specified anxiety disorders: Secondary | ICD-10-CM

## 2022-06-24 MED ORDER — BUSPIRONE HCL 15 MG PO TABS: 15.0000 mg | ORAL_TABLET | Freq: Two times a day (BID) | ORAL | 3 refills | Status: DC

## 2022-06-24 MED ORDER — SERTRALINE HCL 50 MG PO TABS: ORAL_TABLET | ORAL | 3 refills | Status: DC

## 2022-06-24 NOTE — Progress Notes (Signed)
[facility]  PATIENT: Raven Montoya DOB: July 20, 1960  REFERRING CLINICIAN: Antony Haste HISTORY FROM: Patient  REASON FOR VISIT: MS   HISTORICAL  CHIEF COMPLAINT:  Chief Complaint  Patient presents with   Follow-up    Pt in room 10. Here for MS follow up. Pt is stable from MS standpoint.     HISTORY OF PRESENT ILLNESS:  [name] is a 62 y.o. woman with relapsing remitting MS.      Update  06/24/2022: She is on Gilenya and tolerates it well. Due to low lymphocytes, she takes the Gilenya qod.      Last brain and cervical spine MRI on 12/16/2020 showed no changes compared to the 01/13/18 MRI.      She feels neurologically stable but anxiety is much worse,    In the past  she was on zoloft and tolerated it well and had benefit.     Her gait is mildly off balanced but she could walk a mile without stopping.   She needs to use a bannister on stairs.  She has mild left leg weakness and spasticity associated with pain.    Spasms and cramps occur at night when she has been more active but not most nights.  Some will wake her up.    She feels both arms are weaker, esp the right.   She has some transient mild numbness in hands and feet but no persistent numbness,  Her bladder has done better since kidnety stones were removed.    Vision is stable and she has recent examination.    She has had worse depression the last couple weeks though notes no new stressors in life.    She is on duloxetine 60 mg.  She also feels more nervous as well.  She feels more scatter brained since this started.  She has not seen behavioral health.    She has fatigue.  Adderall has helped her attention deficit nd fatigue.   Recently she is sleeping worse the past month (sleep maintenance issues). ' She has neck pain predominantly on the left in the lower cervical paraspinal muscles, trapezius and adjacent muscles.  Range of motion is moderately reduced.   Meloxicam was stopped due to elevated  creatinine.      Steroid TPIs have helped her neck pain in the past for a couple months at a time.  She notes the ROM is reduced especially looking to the left.   She also has lumbar pain and reports one level jutting out (spondylolisthesis?)   MS History:   She was diagnosed with multiple sclerosis at age 20 after presenting with optic neuritis in the left eye. Her neurologist discussed with her that she probably had MS but no medication was started at that time. Vision got better over the next few weeks. A couple years later, she went to see Dr. Trudie Buckler. He felt that the MS should be treated and she was started on Rebif. She remain on Rebif for many years   She had not had any major exacerbations while on Rebif but had needles fatigue.    In 2014, she switched to Gilenya. She tolerates Gilenya well.   Imaging: MRI of the brain 01/11/2018 shows T2/FLAIR hyperintense foci in the periventricular, juxtacortical and deep white matter.  There were no new lesions.  No change compared to 01/25/2017.  MRI of the cervical spine 01/11/2018 showed a subtle focus to the left at C4.Marland Kitchen  She had multilevel degenerative changes  with a disc protrusion at C2-C3 towards the left, disc protrusion, posterior longitudinal ligament hypertrophy and facet hypertrophy at C3-C4 and C4-C5.  There is mild spinal stenosis at those levels.  MRI of the brain 01/25/2017 showed Multiple T2/FLAIR hyperintense foci in the periventricular, juxtacortical and deep white matter of both hemispheres in a pattern and configuration consistent with chronic demyelinating plaque associated with multiple sclerosis. None of the foci appears to be acute and there is no change when compared to the 02/12/2015 MRI.  MRI brain 12/16/2020 was unchanged.  MRI cervical spine 12/16/2020 IMPRESSION: This MRI of the cervical spine without contrast shows the following: 1.  There are subtle T2 hyperintense foci within the spinal cord towards the left  adjacent to C4 and possibly adjacent to C4-C5.  Both of these were present on the 2019 MRI.  They are consistent with chronic demyelinating plaque associated with multiple sclerosis. 2.   There is thickening of the posterior longitudinal ligament, possibly associated with ossification, from C2-C3 through C4-C5.  This causes mild spinal stenosis at C3-C4 and mild to moderate spinal stenosis at C4-C5 but no nerve root compression. 3.   At C5-C6, there is mild spinal stenosis due to left paramedian disc osteophyte complex causing moderate left foraminal narrowing but no nerve root compression. 4.   At C6-C7, there is a left paramedian disc protrusion causing mild left foraminal narrowing but no spinal stenosis or nerve root compression. 5.   Degenerative changes are stable compared to the 2019 MRI.  REVIEW OF SYSTEMS:  Constitutional: No fevers, chills, sweats, or change in appetite.   She has fatigue and mild insomnia (helped by Ambien) Eyes: No visual changes, double vision, eye pain Ear, nose and throat: No hearing loss, ear pain, nasal congestion, sore throat Cardiovascular: No chest pain, palpitations Respiratory:  No shortness of breath at rest or with exertion.   No wheezes GastrointestinaI: No nausea, vomiting, diarrhea, abdominal pain, fecal incontinence Genitourinary:  No dysuria, urinary retention or frequency.  No nocturia. Musculoskeletal: Neck and back pain as above Integumentary: No rash, pruritus, skin lesions Neurological: as above Psychiatric: as above.    Endocrine: No palpitations, diaphoresis, change in appetite, change in weigh or increased thirst Hematologic/Lymphatic:  No anemia, purpura, petechiae. Allergic/Immunologic: No itchy/runny eyes, nasal congestion, recent allergic reactions, rashes  ALLERGIES: No Known Allergies   HOME MEDICATIONS: Outpatient Medications Prior to Visit  Medication Sig Dispense Refill   CALCIUM CARBONATE PO Take 250 mg by mouth daily.      cyclobenzaprine (FLEXERIL) 10 MG tablet Take 1 tablet (10 mg total) by mouth at bedtime as needed. 90 tablet 3   DULoxetine (CYMBALTA) 60 MG capsule TAKE 1 CAPSULE EVERY DAY 90 capsule 1   ferrous sulfate 325 (65 FE) MG tablet 325 mg daily with breakfast.     Fingolimod HCl (GILENYA) 0.5 MG CAPS TAKE ONE CAPSULE BY MOUTH ONCE DAILY. MAY TAKE WITH OR WITHOUT FOOD. STORE AT ROOM TEMPERATURE. 90 capsule 3   furosemide (LASIX) 20 MG tablet Take 1 tablet (20 mg total) by mouth daily. 30 tablet 3   HYDROcodone-acetaminophen (NORCO/VICODIN) 5-325 MG tablet Take 1-2 tablets by mouth every 6 (six) hours as needed.     IRON PO Take 1 tablet by mouth 2 (two) times a week. occ     meloxicam (MOBIC) 15 MG tablet Take 15 mg by mouth daily as needed.     potassium chloride SA (KLOR-CON M) 20 MEQ tablet Take 1 tablet (20 mEq total) by  mouth daily. 30 tablet 3   vitamin B-12 (CYANOCOBALAMIN) 1000 MCG tablet Take 1,000 mcg by mouth daily.     zolpidem (AMBIEN) 10 MG tablet 5 mg at bedtime.     phentermine 37.5 MG capsule Take 1 capsule (37.5 mg total) by mouth every morning. (Patient not taking: Reported on 06/24/2022) 30 capsule 5   Facility-Administered Medications Prior to Visit  Medication Dose Route Frequency Provider Last Rate Last Admin   gadopentetate dimeglumine (MAGNEVIST) injection 20 mL  20 mL Intravenous Once PRN Loranzo Desha, Pearletha Furl, MD       gadopentetate dimeglumine (MAGNEVIST) injection 20 mL  20 mL Intravenous Once PRN Anysha Frappier, Pearletha Furl, MD        PAST MEDICAL HISTORY: Past Medical History:  Diagnosis Date   Anemia    Anxiety    Arthritis    oa   Back pain    Bilateral swelling of feet    Cancer (HCC) 2018   right breast   Depression    Depression    Fatty liver    Fractured rib hx of 2018   healed   History of hiatal hernia    small per ct per pt   History of kidney stones yrs ago   passed on own   History of radiation therapy 06/02/16-07/14/16   right breast 50.4 Gy in 28 fractions    Joint pain    Memory loss    occ   Multiple sclerosis (HCC)    age 52   Shingles 2016   back   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   Urinary urgency    Wears glasses    Wears glasses     PAST SURGICAL HISTORY: Past Surgical History:  Procedure Laterality Date   ADENOIDECTOMY     BREAST LUMPECTOMY WITH RADIOACTIVE SEED AND SENTINEL LYMPH NODE BIOPSY Right 03/25/2016   Procedure: RIGHT BREAST SEED GUIDED PARTIAL MASTECTOMY WITH RIGHT SENITINEL LYMPH NODE MAPPING;  Surgeon: Harriette Bouillon, MD;  Location: [facility] OR;  Service: General;  Laterality: Right;   BREAST RECONSTRUCTION Right 03/25/2016   Procedure: RIGHT BREAST RECONSTRUCTION;  Surgeon: Alena Bills Dillingham, DO;  Location: [facility] OR;  Service: Plastics;  Laterality: Right;   BREAST REDUCTION SURGERY Right 03/25/2016   Procedure: MAMMARY REDUCTION  (BREAST) RIGHT;  Surgeon: Alena Bills Dillingham, DO;  Location: [facility];  Service: Plastics;  Laterality: Right;   BREAST REDUCTION SURGERY Left 01/06/2017   Procedure: LEFT MAMMARY REDUCTION  (BREAST) FOR SYMMETRY WITH LIPOSUCTION;  Surgeon: Peggye Form, DO;  Location: [facility];  Service: Plastics;  Laterality: Left;   BREAST SURGERY Left 01/25/2018   breast reduction   BUNIONECTOMY Bilateral 2011   pins in both big toes   CESAREAN SECTION     x 1   COLONOSCOPY W/ BIOPSIES AND POLYPECTOMY  2018 and 2021   f/u 5 yrs now   CYSTOSCOPY WITH RETROGRADE PYELOGRAM, URETEROSCOPY AND STENT PLACEMENT Right 05/13/2020   Procedure: CYSTOSCOPY WITH RETROGRADE PYELOGRAM, URETEROSCOPY AND STENT PLACEMENT;  Surgeon: Noel Christmas, MD;  Location: [facility];  Service: Urology;  Laterality: Right;  1 HR   HOLMIUM LASER APPLICATION Right 05/13/2020   Procedure: HOLMIUM LASER APPLICATION;  Surgeon: Noel Christmas, MD;  Location[facility];  Service: Urology;  Laterality: Right;   INCISION AND DRAINAGE OF WOUND Right 04/21/2016    Procedure: IRRIGATION AND DEBRIDEMENT RIGHT BREAST WOUND;  Surgeon: Alena Bills Dillingham, DO;  Location: [facility];  Service: Plastics;  Laterality: Right;   MASS EXCISION Left 02/24/2017   Procedure: LEFT BREAST WOUND EXCISION AND CLOSURE WITH ACELL APPLICATION;  Surgeon: Peggye Form, DO;  Location: [facility];  Service: Plastics;  Laterality: Left;   TONSILLECTOMY  age 86   and adenoids    FAMILY HISTORY: Family History  Problem Relation Age of Onset   Osteoporosis Mother    Heart disease Mother    Stroke Mother    COPD Mother    Cancer Father        colon   Heart disease Brother    Heart attack Brother     SOCIAL HISTORY:  Social History   Socioeconomic History   Marital status: Divorced    Spouse name: Not on file   Number of children: 1   Years of education: Not on file   Highest education level: Not on file  Occupational History   Occupation: Midwife   Occupation: Retired  Tobacco Use   Smoking status: Never   Smokeless tobacco: Never  Building services engineer Use: Never used  Substance and Sexual Activity   Alcohol use: Yes    Comment: rare   Drug use: No   Sexual activity: Not Currently    Partners: Male    Birth control/protection: Condom    Comment: partner has had vasectomy  Other Topics Concern   Not on file  Social History Narrative   Not on file   Social Determinants of Health   Financial Resource Strain: Not on file  Food Insecurity: Not on file  Transportation Needs: Not on file  Physical Activity: Not on file  Stress: Not on file  Social Connections: Not on file  Intimate Partner Violence: Not on file     PHYSICAL EXAM  Vitals:   06/24/22 1116 06/24/22 1120  BP: (!) 144/81 129/79  Pulse: 69 70  Weight: 219 lb 8 oz (99.6 kg)   Height: 5\' 3"  (1.6 m)      Body mass index is 38.88 kg/m.   General: She is a well-developed and well-nourished and in no acute distress  Musculoskeletal:   She  has moderate lower cervical tenderness on the left and also in the left multifidus and trapezius muscle.  Mild reduced range of motion of the neck., worse looking to the left.     Neurologic Exam  Mental status: The patient is alert and oriented x 3 at the time of the examination. The patient has apparent normal recent and remote memory, with an apparently normal attention span and concentration ability.   Speech is normal.  Cranial nerves: Extraocular movements are full.  Facial strength and sensation is normal.  Trapezius strength is normal.  Hearing is normal.  Motor:  Muscle bulk and tone are normal. Strength is  5 / 5 in all 4 extremities.   Sensory: Intact sensation to touch and vibration in the arms and legs.  Coordination: Finger-nose-finger and heel-to-shin was performed well bilaterally.  Gait and station: Station and gait are normal.  Tandem gait is mildly wide  Romberg negative,   Reflexes: Deep tendon reflexes are symmetric and brisk bilaterally but no ankle clonus.    Multiple sclerosis (HCC)  High risk medication use  Gait disturbance  Lymphopenia  Depression with anxiety  Neck pain    1.   Her MS has been stable on Gilenya lymphocytes were 0.3 03/2022 on qod dosing.   Will check  lymphocyte count again later this year. 2.   Continue Cymbalta 60 mg.  Add BuSpar for anxiety 3.   Stay active and exercise as tolerated. 4.   Trigger point injection of left splenius cervicus, multifidus, C6-C7 paraspinal and trapezius muscles with 80 mg Depo-Medrol in 5 cc Marcaine using sterile technique.  She tolerated the injection well and pain was a little bit better afterwards.  Return in 6 months or sooner if there are new or worsening neurologic symptoms.   This visit is part of a comprehensive longitudinal care medical relationship regarding the patients primary diagnosis of multiple sclerosis and related concerns.   Donterius Filley A. Epimenio Foot, MD, PhD 06/24/2022, 8:06 PM Certified in  Neurology, Clinical Neurophysiology, Sleep Medicine, Pain Medicine and Neuroimaging  [facility] [address] [place], [place] [address] [phone]

## 2022-07-11 ENCOUNTER — Encounter: Payer: Self-pay | Admitting: Neurology

## 2022-07-12 ENCOUNTER — Other Ambulatory Visit: Payer: Self-pay | Admitting: *Deleted

## 2022-07-12 MED ORDER — AMPHETAMINE-DEXTROAMPHET ER 20 MG PO CP24: 20.0000 mg | ORAL_CAPSULE | Freq: Every day | ORAL | 0 refills | Status: DC

## 2022-07-21 ENCOUNTER — Other Ambulatory Visit: Payer: Self-pay | Admitting: *Deleted

## 2022-07-21 MED ORDER — AMPHETAMINE-DEXTROAMPHETAMINE 15 MG PO TABS: 30.0000 mg | ORAL_TABLET | Freq: Every day | ORAL | 0 refills | Status: DC

## 2022-08-08 ENCOUNTER — Other Ambulatory Visit: Payer: Self-pay | Admitting: Neurology

## 2022-08-09 NOTE — Telephone Encounter (Signed)
Pt last seen on 06/24/22 per note "Continue Cymbalta 60 mg" Follow up scheduled on 01/03/23 Last filled on 06/14/22 #30 tablets ( 30 day supply)

## 2022-08-29 ENCOUNTER — Other Ambulatory Visit: Payer: Self-pay | Admitting: Neurology

## 2022-08-30 MED ORDER — AMPHETAMINE-DEXTROAMPHETAMINE 15 MG PO TABS: 30.0000 mg | ORAL_TABLET | Freq: Every day | ORAL | 0 refills | Status: DC

## 2022-08-30 NOTE — Telephone Encounter (Signed)
Patient last seen on 06/09/22 Follow up scheduled on 01/03/23 Last filled on 07/21/22 #60 tablets (30 day supply) Rx pending to be signed

## 2022-10-11 ENCOUNTER — Other Ambulatory Visit: Payer: Self-pay | Admitting: Neurology

## 2022-10-12 MED ORDER — AMPHETAMINE-DEXTROAMPHETAMINE 15 MG PO TABS: 30.0000 mg | ORAL_TABLET | Freq: Every day | ORAL | 0 refills | Status: DC

## 2022-10-12 NOTE — Telephone Encounter (Signed)
Last seen on 06/24/22 Follow up scheduled on 01/03/23 Last filled on 08/30/22 #60 tablets (30 day supply) Rx pending to be signed

## 2022-10-14 ENCOUNTER — Telehealth: Payer: Self-pay | Admitting: *Deleted

## 2022-10-14 NOTE — Telephone Encounter (Signed)
Noted  

## 2022-10-14 NOTE — Telephone Encounter (Signed)
Received fax from [employer] [facility] they will no longer support [place] existing patients thru 02-08-2023.   Will follow till end of pts enrollment.  (This notice effective 11/2022).  [phone].  Her next appt is 01-03-2023.

## 2022-10-26 ENCOUNTER — Encounter: Payer: Self-pay | Admitting: Neurology

## 2022-10-26 NOTE — Telephone Encounter (Signed)
I reviewed the chart. At the last appointment referral to NSG was not discussed. I would prefer for Dr. Epimenio Foot to weigh in and then suggest the surgical referral. Last MRI c spine was in 2022. I will route to Dr. Epimenio Foot for review. Pt had trigger point injection in May and may benefit from further injections. Next appt in 2 months.

## 2022-10-26 NOTE — Telephone Encounter (Signed)
Dr.Athar you are work in provider for the pm today. Are you okay with referral being placed?   Please advise

## 2022-10-29 ENCOUNTER — Ambulatory Visit: Payer: Medicare HMO | Admitting: Obstetrics and Gynecology

## 2022-10-31 ENCOUNTER — Other Ambulatory Visit: Payer: Self-pay | Admitting: Neurology

## 2022-10-31 DIAGNOSIS — M47812 Spondylosis without myelopathy or radiculopathy, cervical region: Secondary | ICD-10-CM

## 2022-10-31 DIAGNOSIS — M488X9 Other specified spondylopathies, site unspecified: Secondary | ICD-10-CM

## 2022-10-31 DIAGNOSIS — G8929 Other chronic pain: Secondary | ICD-10-CM

## 2022-11-02 NOTE — Telephone Encounter (Signed)
Referral sent to [facility].

## 2022-11-22 ENCOUNTER — Ambulatory Visit: Payer: Medicare HMO | Admitting: Podiatry

## 2022-11-22 ENCOUNTER — Ambulatory Visit (INDEPENDENT_AMBULATORY_CARE_PROVIDER_SITE_OTHER): Payer: Medicare HMO

## 2022-11-22 ENCOUNTER — Encounter: Payer: Self-pay | Admitting: Podiatry

## 2022-11-22 VITALS — BP 153/83 | HR 78

## 2022-11-22 DIAGNOSIS — M779 Enthesopathy, unspecified: Secondary | ICD-10-CM | POA: Diagnosis not present

## 2022-11-22 DIAGNOSIS — S96912A Strain of unspecified muscle and tendon at ankle and foot level, left foot, initial encounter: Secondary | ICD-10-CM

## 2022-11-22 DIAGNOSIS — R262 Difficulty in walking, not elsewhere classified: Secondary | ICD-10-CM | POA: Diagnosis not present

## 2022-11-22 DIAGNOSIS — M7752 Other enthesopathy of left foot: Secondary | ICD-10-CM | POA: Diagnosis not present

## 2022-11-22 DIAGNOSIS — S92025A Nondisplaced fracture of anterior process of left calcaneus, initial encounter for closed fracture: Secondary | ICD-10-CM | POA: Diagnosis not present

## 2022-11-22 NOTE — Progress Notes (Unsigned)
Chief Complaint  Patient presents with   Foot Pain    "The pain started on the side of my foot and it has moved up." N - side of foot pain L - lateral left D - over 1 month O - suddenly, since I twisted my foot C - sharp pain A - pressure T - wrapped it, Tylenol   HPI: 62 y.o. female presents today following an injury to the left foot.  She notes that she was wearing flip-flops and stepped off a curb approximately 6 weeks ago and rolled her ankle.  She has had continued pain in the area for the past 6 weeks.  Also notes a recent secondary injury to the dorsum of the left forefoot from dropping something on the foot.  Acknowledges that it still bruised but states that this feels okay.  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   Urinary urgency    Wears glasses    Wears glasses     Past Surgical History:  Procedure Laterality Date   ADENOIDECTOMY     BREAST LUMPECTOMY WITH RADIOACTIVE SEED AND SENTINEL LYMPH NODE BIOPSY Right 03/25/2016   Procedure: RIGHT BREAST SEED GUIDED PARTIAL MASTECTOMY WITH RIGHT SENITINEL LYMPH NODE MAPPING;  Surgeon: Harriette Bouillon, MD;  Location: [facility];  Service: General;  Laterality: Right;   BREAST RECONSTRUCTION Right 03/25/2016   Procedure: RIGHT BREAST RECONSTRUCTION;  Surgeon: Alena Bills Dillingham, DO;  Location: [facility];  Service: Plastics;  Laterality: Right;   BREAST REDUCTION SURGERY Right 03/25/2016    Procedure: MAMMARY REDUCTION  (BREAST) RIGHT;  Surgeon: Alena Bills Dillingham, DO;  Location: [facility];  Service: Plastics;  Laterality: Right;   BREAST REDUCTION SURGERY Left 01/06/2017   Procedure: LEFT MAMMARY REDUCTION  (BREAST) FOR SYMMETRY WITH LIPOSUCTION;  Surgeon: Peggye Form, DO;  Location: [facility];  Service: Plastics;  Laterality: Left;   BREAST SURGERY Left 01/25/2018   breast reduction   BUNIONECTOMY Bilateral 2011   pins in both big toes   CESAREAN SECTION     x 1   COLONOSCOPY W/ BIOPSIES AND POLYPECTOMY  2018 and 2021   f/u 5 yrs now   CYSTOSCOPY WITH RETROGRADE PYELOGRAM, URETEROSCOPY AND STENT PLACEMENT Right 05/13/2020   Procedure: CYSTOSCOPY WITH RETROGRADE PYELOGRAM, URETEROSCOPY AND STENT PLACEMENT;  Surgeon: Noel Christmas, MD;  Location: [facility];  Service: Urology;  Laterality: Right;  1 HR   HOLMIUM LASER APPLICATION Right 05/13/2020   Procedure: HOLMIUM LASER APPLICATION;  Surgeon: Noel Christmas, MD;  Location: [facility];  Service: Urology;  Laterality: Right;   INCISION AND DRAINAGE OF WOUND Right 04/21/2016  Procedure: IRRIGATION AND DEBRIDEMENT RIGHT BREAST WOUND;  Surgeon: Peggye Form, DO;  Location: [facility];  Service: Plastics;  Laterality: Right;   MASS EXCISION Left 02/24/2017   Procedure: LEFT BREAST WOUND EXCISION AND CLOSURE WITH ACELL APPLICATION;  Surgeon: Peggye Form, DO;  Location: [facility];  Service: Plastics;  Laterality: Left;   TONSILLECTOMY  age 66   and adenoids   No Known Allergies  Review of Systems  Musculoskeletal:  Positive for joint pain.      Physical Exam: Vitals:   11/22/22 1546  BP: (!) 153/83  Pulse: 78    General: The patient is alert and oriented x3 in no acute distress.  Dermatology: Skin is warm, dry and supple bilateral lower extremities. Interspaces are clear of maceration and debris.  Some fading ecchymosis  noted on the dorsal aspect of the left forefoot near the second and third metatarsophalangeal joints.  Vascular: Palpable pedal pulses bilaterally. Capillary refill within normal limits.  No appreciable edema.  No erythema or calor.  Neurological: Light touch sensation grossly intact bilateral feet.   Musculoskeletal Exam: There is pain on palpation to the lateral aspect of the sinus tarsi joint.  There is pain with attempted motion of the sinus tarsi joint.  Forced dorsiflexion of the foot also aggravates her pain.  Long flexor and extensor tendons are intact and not painful on palpation.  Resisted plantarflexion close of reproduces pain.  Radiographic Exam (left foot, 3 weightbearing views, 11/22/2022):  Normal osseous mineralization.  On the medial oblique view there appears to be a fracture of the anterior process of the calcaneus in anatomic position.  No other abnormalities noted.  Assessment/Plan of Care: 1. Closed nondisplaced fracture of anterior process of left calcaneus, initial encounter   2. Tendonitis   3. Strain of left foot, initial encounter   4. Difficulty walking     PR PNEUMAT WALKING BOOT PRE CST CT FOOT LEFT WO CONTRAST  Discussed clinical and radiographic findings with patient today.  Informed her that the medial oblique view is suspicious for a fracture of the anterior process of the calcaneus.  Will need to get a CT scan to confirm this and also help visualize the position of the anterior fragment in all 3 planes.  At this point, we will proceed with conservative options and she will be immobilized in a pneumatic cam-walker that she will need to wear at all times.  She can remove for bathing briefly.  She needs to stay off the foot is much as possible and keep it elevated.  Will call patient to review results and discuss treatment plan.   Clerance Lav, DPM, FACFAS [facility]     [address] [place], [place] [address]                Office [phone]  Fax [phone]

## 2022-12-07 ENCOUNTER — Ambulatory Visit: Payer: Medicare HMO | Admitting: Obstetrics and Gynecology

## 2022-12-07 ENCOUNTER — Encounter: Payer: Self-pay | Admitting: Obstetrics and Gynecology

## 2022-12-07 VITALS — BP 118/84 | HR 93 | Ht 62.5 in | Wt 219.0 lb

## 2022-12-07 DIAGNOSIS — R350 Frequency of micturition: Secondary | ICD-10-CM | POA: Diagnosis not present

## 2022-12-07 DIAGNOSIS — N952 Postmenopausal atrophic vaginitis: Secondary | ICD-10-CM | POA: Diagnosis not present

## 2022-12-07 DIAGNOSIS — N3281 Overactive bladder: Secondary | ICD-10-CM

## 2022-12-07 LAB — POCT URINALYSIS DIPSTICK
Bilirubin, UA: NEGATIVE
Blood, UA: NEGATIVE
Glucose, UA: NEGATIVE
Ketones, UA: NEGATIVE
Leukocytes, UA: NEGATIVE
Nitrite, UA: NEGATIVE
Protein, UA: NEGATIVE
Spec Grav, UA: >=1.03 — AB (ref 1.010–1.025)
Urobilinogen, UA: 0.2 U/dL
pH, UA: 6 (ref 5.0–8.0)

## 2022-12-07 MED ORDER — SOLIFENACIN SUCCINATE 5 MG PO TABS
5.0000 mg | ORAL_TABLET | Freq: Every day | ORAL | 5 refills | Status: DC
Start: 2022-12-07 — End: 2023-08-17

## 2022-12-07 MED ORDER — ESTRADIOL 0.1 MG/GM VA CREA
TOPICAL_CREAM | VAGINAL | 11 refills | Status: DC
Start: 2022-12-09 — End: 2023-01-03

## 2022-12-07 NOTE — Patient Instructions (Addendum)
Today we talked about ways to manage bladder urgency such as altering your diet to avoid irritative beverages and foods (bladder diet) as well as attempting to decrease stress and other exacerbating factors.    The Most Bothersome Foods* The Least Bothersome Foods*  Coffee - Regular & Decaf Tea - caffeinated Carbonated beverages - cola, non-colas, diet & caffeine-free Alcohols - Beer, Red Wine, White Wine, [address] - Grapefruit, [place], Orange, [employer] - Cranberry, Grapefruit, Orange, Pineapple Vegetables - Tomato & Tomato Products Flavor Enhancers - Hot peppers, Spicy foods, Chili, Horseradish, Vinegar, Monosodium glutamate (MSG) Artificial Sweeteners - NutraSweet, Sweet 'N Low, Equal (sweetener), Saccharin Ethnic foods - [place], [place], [place] food [employer] - low-fat & whole Fruits - Bananas, Blueberries, Honeydew melon, Pears, Raisins, Watermelon Vegetables - Broccoli, [address] Sprouts, [place], Carrots, Cauliflower, Bunch, Cucumber, Mushrooms, Peas, Radishes, Squash, Zucchini, White potatoes, Sweet potatoes & yams Poultry - Chicken, Eggs, [place], [employer] - Beef, Diplomatic Services operational officer, Lamb Seafood - Shrimp, [place] fish, Salmon Grains - Oat, Rice Snacks - Pretzels, Popcorn  *Lenward Chancellor et al. Diet and its role in interstitial cystitis/bladder pain syndrome (IC/BPS) and comorbid conditions. [employer]. BJU Int. 2012 Jan 11.   Vulvovaginal moisturizer Options: Vitamin E oil (pump or capsule) or cream (Gene's Vit E Cream) Coconut oil Silicone-based lubricant for use during intercourse ("uberlube" or "wet platinum" is a brand available at most drugstores) Crisco Consider the ingredients of the product - the fewer the ingredients the better!  Directions for Use: Clean and dry your hands Gently dab the vulvar/vaginal area dry as needed Apply a "pea-sized" amount of the moisturizer onto your fingertip Using you other hand, open the labia  Apply the moisturizer to the  vulvar/vaginal tissues Wear loose fitting underwear/clothing if possible following application Use moisturize up to 3 times daily as desired.  Do not use soap on the inner vulva to clean- water only!  Start vaginal estrogen therapy 2 times weekly at night for treatment of vaginal atrophy (dryness of the vaginal tissues).  Please let us know if the prescription is too expensive and we can look for alternative options.   We discussed the symptoms of overactive bladder (OAB), which include urinary urgency, urinary frequency, night-time urination, with or without urge incontinence.  We discussed management including behavioral therapy (decreasing bladder irritants by following a bladder diet, urge suppression strategies, timed voids, bladder retraining), physical therapy, medication. We will start Vesicare 5mg  daily for your bladder.

## 2022-12-07 NOTE — Progress Notes (Signed)
New Patient Evaluation and Consultation  Referring Provider: Salli Real, MD PCP: Salli Real, MD Date of Service: 12/07/2022  SUBJECTIVE Chief Complaint: New Patient (Initial Visit) (BAMBI KELP is a 62 y.o. female here for a consult for urinary urgency for the past 2 years./)  History of Present Illness: TYSHANA WILTBANK is a 62 y.o. White or Caucasian female presenting for evaluation of incontinence.     Urinary Symptoms: Leaks urine with without sensation and continuously. Denies leakage with cough and sneeze.  Leaks 2-3 time(s) per day- comes out of nowhere.  Pad use: 2 pads per day.   Patient is bothered by UI symptoms. Has not had any prior treatment. Symptoms have been present for a few years.   Day time voids 4-5.  Nocturia: 1 times per night to void. Always has the feeling like she has to go.  Voiding dysfunction:  empties bladder well.  Patient does not use a catheter to empty bladder.  When urinating, patient feels dribbling after finishing and the need to urinate multiple times in a row Drinks: 4 large cups Water, 1 can diet soda per day. Gave up tea a few months back.   UTIs:  0  UTI's in the last year.  But she has felt like she has a UTI- testing was negative. Has some burning when she urinates.  Reports history of kidney or bladder stones- had laser removal and stent placement in 2022. She reports she did not have any symptoms other than a change in her labs.   No results found for the last 90 days.   Pelvic Organ Prolapse Symptoms:                  Patient Denies a feeling of a bulge the vaginal area.   Bowel Symptom: Bowel movements: 2 time(s) per day Stool consistency: soft  Straining: no.  Splinting: no.  Incomplete evacuation: no.  Patient Denies accidental bowel leakage / fecal incontinence Bowel regimen: diet  HM Colonoscopy          Colonoscopy (Every 10 Years) Next due on 11/26/2028    11/27/2018  Outside Procedure: PR COLONOSCOPY FLX DX  W/COLLJ SPEC WHEN PFRMD   Only the first 1 history entries have been loaded, but more history exists.            Sexual Function Sexually active: yes.  Pain with sex: No Feels like she smells like urine so has not been active in a few years.  Sometimes she uses soap on her inner vulva.  She is using plain cotton pads  Pelvic Pain Denies pelvic pain  Past Medical History:  Past Medical History:  Diagnosis Date   Anemia    Anxiety    Arthritis    oa   Back pain    Bilateral swelling of feet    Cancer (HCC) 2018   right breast   Depression    Depression    Fatty liver    Fractured rib hx of 2018   healed   History of hiatal hernia    small per ct per pt   History of kidney stones yrs ago   passed on own   History of radiation therapy 06/02/16-07/14/16   right breast 50.4 Gy in 28 fractions   Joint pain    Memory loss    occ   Multiple sclerosis (HCC)    age 72   Shingles 2016   back   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   Urinary urgency    Wears glasses    Wears glasses      Past Surgical History:   Past Surgical History:  Procedure Laterality Date   ADENOIDECTOMY     BREAST LUMPECTOMY WITH RADIOACTIVE SEED AND SENTINEL LYMPH NODE BIOPSY Right 03/25/2016   Procedure: RIGHT BREAST SEED GUIDED PARTIAL MASTECTOMY WITH RIGHT SENITINEL LYMPH NODE MAPPING;  Surgeon: Harriette Bouillon, MD;  Location: [facility];  Service: General;  Laterality: Right;   BREAST RECONSTRUCTION Right 03/25/2016   Procedure: RIGHT BREAST RECONSTRUCTION;  Surgeon: Alena Bills Dillingham, DO;  Location: [facility];  Service: Plastics;  Laterality: Right;   BREAST REDUCTION SURGERY Right 03/25/2016   Procedure: MAMMARY REDUCTION  (BREAST) RIGHT;  Surgeon: Alena Bills Dillingham, DO;  Location: [facility];  Service: Plastics;  Laterality: Right;   BREAST REDUCTION SURGERY Left 01/06/2017   Procedure: LEFT MAMMARY REDUCTION  (BREAST) FOR SYMMETRY WITH LIPOSUCTION;  Surgeon: Peggye Form, DO;   Location: [facility];  Service: Plastics;  Laterality: Left;   BREAST SURGERY Left 01/25/2018   breast reduction   BUNIONECTOMY Bilateral 2011   pins in both big toes   CESAREAN SECTION     x 1   COLONOSCOPY W/ BIOPSIES AND POLYPECTOMY  2018 and 2021   f/u 5 yrs now   CYSTOSCOPY WITH RETROGRADE PYELOGRAM, URETEROSCOPY AND STENT PLACEMENT Right 05/13/2020   Procedure: CYSTOSCOPY WITH RETROGRADE PYELOGRAM, URETEROSCOPY AND STENT PLACEMENT;  Surgeon: Noel Christmas, MD;  [facility];  Service: Urology;  Laterality: Right;  1 HR   HOLMIUM LASER APPLICATION Right 05/13/2020   Procedure: HOLMIUM LASER APPLICATION;  Surgeon: Noel Christmas, MD;  Location: [facility];  Service: Urology;  Laterality: Right;   INCISION AND DRAINAGE OF WOUND Right 04/21/2016   Procedure: IRRIGATION AND DEBRIDEMENT RIGHT BREAST WOUND;  Surgeon: Peggye Form, DO;  Location: [facility];  Service: Plastics;  Laterality: Right;   MASS EXCISION Left 02/24/2017   Procedure: LEFT BREAST WOUND EXCISION AND CLOSURE WITH ACELL APPLICATION;  Surgeon: Peggye Form, DO;  Location: [facility];  Service: Plastics;  Laterality: Left;   TONSILLECTOMY  age 30   and adenoids     Past OB/GYN History: OB History  Gravida Para Term Preterm AB Living  1 1 1     1   SAB IAB Ectopic Multiple Live Births          1    # Outcome Date GA Lbr Len/2nd Weight Sex Type Anes PTL Lv  1 Term     M CS-LTranv   LIV    Menopausal: Denies vaginal bleeding since menopause     Component Value Date/Time   DIAGPAP  07/18/2018 0000    NEGATIVE FOR INTRAEPITHELIAL LESIONS OR MALIGNANCY.   DIAGPAP  04/13/2017 0000    NEGATIVE FOR INTRAEPITHELIAL LESIONS OR MALIGNANCY.   ADEQPAP  07/18/2018 0000    Satisfactory for evaluation  endocervical/transformation zone component ABSENT.   ADEQPAP  04/13/2017 0000    Satisfactory for evaluation   endocervical/transformation zone component ABSENT.    Medications: Patient has a current medication list which includes the following prescription(s): amphetamine-dextroamphetamine, buspirone, calcium carbonate antacid, cyclobenzaprine, duloxetine, [START ON 12/09/2022] estradiol, ferrous sulfate, fingolimod hcl, furosemide, hydrocodone-acetaminophen, ferrous sulfate, meloxicam, phentermine, potassium chloride sa, solifenacin, cyanocobalamin, and zolpidem, and the following Facility-Administered Medications: gadopentetate dimeglumine and gadopentetate dimeglumine.   Allergies: Patient has No Known Allergies.   Social  History:  Social History   Tobacco Use   Smoking status: Never   Smokeless tobacco: Never  Vaping Use   Vaping status: Never Used  Substance Use Topics   Alcohol use: Not Currently    Comment: rare   Drug use: No    Relationship status: long-term partner Patient lives with her son.   Patient is not employed. Regular exercise: Yes:   History of abuse: No  Family History:   Family History  Problem Relation Age of Onset   Osteoporosis Mother    Heart disease Mother    Stroke Mother    COPD Mother    Cancer Father        colon   Heart disease Brother    Heart attack Brother      Review of Systems: Review of Systems  Constitutional:  Negative for fever, malaise/fatigue and weight loss.  Respiratory:  Negative for cough, shortness of breath and wheezing.   Cardiovascular:  Negative for chest pain, palpitations and leg swelling.  Gastrointestinal:  Negative for abdominal pain and blood in stool.  Genitourinary:  Negative for dysuria.  Musculoskeletal:  Positive for myalgias.  Skin:  Negative for rash.  Neurological:  Positive for headaches. Negative for dizziness.  Endo/Heme/Allergies:  Bruises/bleeds easily.  Psychiatric/Behavioral:  Negative for depression. The patient is not nervous/anxious.      OBJECTIVE Physical Exam: Vitals:   12/07/22 1018  BP:  118/84  Pulse: 93  Weight: 219 lb (99.3 kg)  Height: 5' 2.5" (1.588 m)    Physical Exam Constitutional:      General: She is not in acute distress. Pulmonary:     Effort: Pulmonary effort is normal.  Abdominal:     General: There is no distension.     Palpations: Abdomen is soft.     Tenderness: There is no abdominal tenderness. There is no rebound.  Musculoskeletal:        General: No swelling. Normal range of motion.  Skin:    General: Skin is warm and dry.     Findings: No rash.  Neurological:     Mental Status: She is alert and oriented to person, place, and time.  Psychiatric:        Mood and Affect: Mood normal.        Behavior: Behavior normal.      GU / Detailed Urogynecologic Evaluation:  Pelvic Exam: Normal external female genitalia; Bartholin's and Skene's glands normal in appearance; urethral meatus normal in appearance, no urethral masses or discharge.   CST: negative  Speculum exam reveals normal vaginal mucosa with atrophy. Cervix normal appearance. Uterus normal single, nontender. Adnexa no mass, fullness, tenderness.     Pelvic floor strength II/V  Pelvic floor musculature: Right levator non-tender, Right obturator non-tender, Left levator non-tender, Left obturator non-tender  POP-Q:   POP-Q  -3                                            Aa   -3                                           Ba  -9  C   2                                            Gh  4                                            Pb  10                                            tvl   -3                                            Ap  -3                                            Bp  -10                                              D      Rectal Exam:  Normal external rectum  Post-Void Residual (PVR) by Bladder Scan: In order to evaluate bladder emptying, we discussed obtaining a postvoid residual and patient agreed to this  procedure.  Procedure: The ultrasound unit was placed on the patient's abdomen in the suprapubic region after the patient had voided.    Post Void Residual - 12/07/22 1030       Post Void Residual   Post Void Residual 18 mL              Laboratory Results: Lab Results  Component Value Date   COLORU Dark Yellow 12/07/2022   CLARITYU Clear 12/07/2022   GLUCOSEUR Negative 12/07/2022   BILIRUBINUR Negative 12/07/2022   KETONESU Negative 12/07/2022   SPECGRAV >=1.030 (A) 12/07/2022   RBCUR Negative 12/07/2022   PHUR 6.0 12/07/2022   PROTEINUR Negative 12/07/2022   UROBILINOGEN 0.2 12/07/2022   LEUKOCYTESUR Negative 12/07/2022    Lab Results  Component Value Date   CREATININE 1.09 (H) 04/28/2022   CREATININE 1.43 (H) 03/31/2022   CREATININE 1.04 (H) 11/12/2021    Lab Results  Component Value Date   HGBA1C 4.9 03/31/2022    Lab Results  Component Value Date   HGB 15.5 03/31/2022     ASSESSMENT AND PLAN Ms. Lauterbach is a 62 y.o. with:  1. Urinary frequency   2. Overactive bladder   3. Vaginal atrophy    OAB - We discussed the symptoms of overactive bladder (OAB), which include urinary urgency, urinary frequency, nocturia, with or without urge incontinence.  While we do not know the exact etiology of OAB, several treatment options exist. We discussed management including behavioral therapy (decreasing bladder irritants, urge suppression strategies, timed voids, bladder retraining), physical therapy, medication; for refractory cases posterior tibial nerve stimulation, sacral neuromodulation, and intravesical botulinum toxin injection.  - Prescribed  vesicare 5mg  daily. For anticholinergic medications, we discussed the potential side effects of anticholinergics including dry eyes, dry mouth, constipation, cognitive impairment and urinary retention. - Avoid irritative beverages- list provided.  - May be interested in pelvic PT in the future.   2. Vaginal atrophy -  start estrace cream 0.5g twice a week - can also use coconut oil or vitamin E for moisture prn - Avoid using soap, esp on the inner vulva. If toilet paper is irritating, can use a squirt bottle instead of wiping.   Return 6 weeks for follow up  Marguerita Beards, MD

## 2022-12-08 ENCOUNTER — Other Ambulatory Visit: Payer: Medicare HMO

## 2022-12-14 ENCOUNTER — Ambulatory Visit
Admission: RE | Admit: 2022-12-14 | Discharge: 2022-12-14 | Disposition: A | Discharge status: Home / Self Care | Payer: Medicare HMO | Source: Ambulatory Visit | Attending: Podiatry

## 2022-12-14 DIAGNOSIS — S96912A Strain of unspecified muscle and tendon at ankle and foot level, left foot, initial encounter: Secondary | ICD-10-CM

## 2022-12-14 DIAGNOSIS — S92025A Nondisplaced fracture of anterior process of left calcaneus, initial encounter for closed fracture: Secondary | ICD-10-CM

## 2022-12-16 ENCOUNTER — Other Ambulatory Visit: Payer: Self-pay | Admitting: *Deleted

## 2022-12-16 ENCOUNTER — Encounter: Payer: Self-pay | Admitting: Neurology

## 2022-12-16 ENCOUNTER — Telehealth: Payer: Self-pay | Admitting: Neurology

## 2022-12-16 DIAGNOSIS — G35 Multiple sclerosis: Secondary | ICD-10-CM

## 2022-12-16 NOTE — Telephone Encounter (Signed)
Pt said want to have an MRI done before appointment with Dr. Epimenio Foot on 01/02/23. Have discussed with Dr.Sater in the past.

## 2022-12-16 NOTE — Telephone Encounter (Signed)
Pt also sent mychart message. Replied to this.

## 2023-01-02 ENCOUNTER — Other Ambulatory Visit: Payer: Self-pay | Admitting: Neurology

## 2023-01-03 ENCOUNTER — Ambulatory Visit: Payer: Medicare HMO | Admitting: Neurology

## 2023-01-03 ENCOUNTER — Encounter: Payer: Self-pay | Admitting: Neurology

## 2023-01-03 VITALS — BP 139/75 | HR 76 | Ht 63.0 in | Wt 229.0 lb

## 2023-01-03 DIAGNOSIS — R899 Unspecified abnormal finding in specimens from other organs, systems and tissues: Secondary | ICD-10-CM

## 2023-01-03 DIAGNOSIS — M488X9 Other specified spondylopathies, site unspecified: Secondary | ICD-10-CM

## 2023-01-03 DIAGNOSIS — R269 Unspecified abnormalities of gait and mobility: Secondary | ICD-10-CM | POA: Diagnosis not present

## 2023-01-03 DIAGNOSIS — F988 Other specified behavioral and emotional disorders with onset usually occurring in childhood and adolescence: Secondary | ICD-10-CM | POA: Diagnosis not present

## 2023-01-03 DIAGNOSIS — Z79899 Other long term (current) drug therapy: Secondary | ICD-10-CM

## 2023-01-03 DIAGNOSIS — R3915 Urgency of urination: Secondary | ICD-10-CM

## 2023-01-03 DIAGNOSIS — G35 Multiple sclerosis: Secondary | ICD-10-CM

## 2023-01-03 DIAGNOSIS — R5383 Other fatigue: Secondary | ICD-10-CM

## 2023-01-03 NOTE — Progress Notes (Signed)
[facility]  PATIENT: Raven Montoya DOB: 05-27-1960  REFERRING CLINICIAN: Antony Haste HISTORY FROM: Patient  REASON FOR VISIT: MS    HISTORICAL  CHIEF COMPLAINT:  Chief Complaint  Patient presents with   Follow-up    Pt in room 11 alone. Here for MS follow up. Pt said she is doing well from MS standpoint.     HISTORY OF PRESENT ILLNESS:  Raven Montoya is a 62 y.o. woman with relapsing remitting MS.      Update  01/03/2023: She is on Gilenya and tolerates it well. Due to low lymphocytes, she takes the Gilenya qod.      Last brain and cervical spine MRI on 12/16/2020 showed no changes compared to the 01/13/18 MRI.      She feels neurologically stable.    No exacerbations.   Anxiety is better.    She twisted her left ankle when wearing flip-flops.  Her gait is mildly off balanced but she could walk a mile without stopping.   She needs to use a bannister on stairs.  She has mild left leg weakness and spasticity associated with pain.    Spasms and cramps occur at night when she has been more active but not most nights.  Some will wake her up.    She feels both arms are weaker, esp the right.   She has some transient mild numbness in hands and feet but no persistent numbness,  Her bladder has done better since kidnety stones were removed.   She has urgency and takes solifenacin.   Vision is stable and she has recent examination.    She has some fatigue but it does not limit her.   Also has MS related ADD.  Adderall has helped her attention deficit nd fatigue.   Recently she is sleeping better with 1/2 Ambien  She has had worse depression the last couple weeks though notes no new stressors in life.    She is on duloxetine 60 mg.  With benefit for depression.    She has neck pain predominantly on the left in the lower cervical paraspinal muscles, trapezius and adjacent muscles.  Range of motion is moderately reduced.   Meloxicam was stopped due to elevated creatinine.       Steroid TPIs have helped her neck pain in the past for a couple months at a time.  She notes the ROM is reduced especially looking to the left.   She also has lumbar pain and reports one level jutting out (spondylolisthesis?)   MS History:   She was diagnosed with multiple sclerosis at age 62 after presenting with optic neuritis in the left eye. Her neurologist discussed with her that she probably had MS but no medication was started at that time. Vision got better over the next few weeks. A couple years later, she went to see Dr. Trudie Buckler. He felt that the MS should be treated and she was started on Rebif. She remain on Rebif for many years   She had not had any major exacerbations while on Rebif but had needles fatigue.    In 2014, she switched to Gilenya. She tolerates Gilenya well.   Imaging: MRI of the brain 01/11/2018 shows T2/FLAIR hyperintense foci in the periventricular, juxtacortical and deep white matter.  There were no new lesions.  No change compared to 01/25/2017.  MRI of the cervical spine 01/11/2018 showed a subtle focus to the left at C4.Marland Kitchen  She had multilevel degenerative changes with  a disc protrusion at C2-C3 towards the left, disc protrusion, posterior longitudinal ligament hypertrophy and facet hypertrophy at C3-C4 and C4-C5.  There is mild spinal stenosis at those levels.  MRI of the brain 01/25/2017 showed Multiple T2/FLAIR hyperintense foci in the periventricular, juxtacortical and deep white matter of both hemispheres in a pattern and configuration consistent with chronic demyelinating plaque associated with multiple sclerosis. None of the foci appears to be acute and there is no change when compared to the 02/12/2015 MRI.  MRI brain 12/16/2020 was unchanged.  MRI cervical spine 12/16/2020 IMPRESSION: This MRI of the cervical spine without contrast shows the following: 1.  There are subtle T2 hyperintense foci within the spinal cord towards the left adjacent to C4  and possibly adjacent to C4-C5.  Both of these were present on the 2019 MRI.  They are consistent with chronic demyelinating plaque associated with multiple sclerosis. 2.   There is thickening of the posterior longitudinal ligament, possibly associated with ossification, from C2-C3 through C4-C5.  This causes mild spinal stenosis at C3-C4 and mild to moderate spinal stenosis at C4-C5 but no nerve root compression. 3.   At C5-C6, there is mild spinal stenosis due to left paramedian disc osteophyte complex causing moderate left foraminal narrowing but no nerve root compression. 4.   At C6-C7, there is a left paramedian disc protrusion causing mild left foraminal narrowing but no spinal stenosis or nerve root compression. 5.   Degenerative changes are stable compared to the 2019 MRI.  REVIEW OF SYSTEMS:  Constitutional: No fevers, chills, sweats, or change in appetite.   She has fatigue and mild insomnia (helped by Ambien) Eyes: No visual changes, double vision, eye pain Ear, nose and throat: No hearing loss, ear pain, nasal congestion, sore throat Cardiovascular: No chest pain, palpitations Respiratory:  No shortness of breath at rest or with exertion.   No wheezes GastrointestinaI: No nausea, vomiting, diarrhea, abdominal pain, fecal incontinence Genitourinary:  No dysuria, urinary retention or frequency.  No nocturia. Musculoskeletal: Neck and back pain as above Integumentary: No rash, pruritus, skin lesions Neurological: as above Psychiatric: as above.    Endocrine: No palpitations, diaphoresis, change in appetite, change in weigh or increased thirst Hematologic/Lymphatic:  No anemia, purpura, petechiae. Allergic/Immunologic: No itchy/runny eyes, nasal congestion, recent allergic reactions, rashes  ALLERGIES: No Known Allergies   HOME MEDICATIONS: Outpatient Medications Prior to Visit  Medication Sig Dispense Refill   amphetamine-dextroamphetamine (ADDERALL) 15 MG tablet Take 2 tablets  by mouth daily. 60 tablet 0   busPIRone (BUSPAR) 15 MG tablet Take 1 tablet (15 mg total) by mouth 2 (two) times daily. 180 tablet 3   CALCIUM CARBONATE PO Take 250 mg by mouth daily.     cyclobenzaprine (FLEXERIL) 10 MG tablet Take 1 tablet (10 mg total) by mouth at bedtime as needed. 90 tablet 3   DULoxetine (CYMBALTA) 60 MG capsule TAKE 1 CAPSULE EVERY DAY 90 capsule 1   ferrous sulfate 325 (65 FE) MG tablet 325 mg daily with breakfast.     Fingolimod HCl (GILENYA) 0.5 MG CAPS TAKE ONE CAPSULE BY MOUTH ONCE DAILY. MAY TAKE WITH OR WITHOUT FOOD. STORE AT ROOM TEMPERATURE. 90 capsule 3   furosemide (LASIX) 20 MG tablet Take 1 tablet (20 mg total) by mouth daily. 30 tablet 3   HYDROcodone-acetaminophen (NORCO/VICODIN) 5-325 MG tablet Take 1-2 tablets by mouth every 6 (six) hours as needed.     IRON PO Take 1 tablet by mouth 2 (two) times a week.  occ     meloxicam (MOBIC) 15 MG tablet Take 15 mg by mouth daily as needed.     potassium chloride SA (KLOR-CON M) 20 MEQ tablet Take 1 tablet (20 mEq total) by mouth daily. 30 tablet 3   solifenacin (VESICARE) 5 MG tablet Take 1 tablet (5 mg total) by mouth daily. 30 tablet 5   vitamin B-12 (CYANOCOBALAMIN) 1000 MCG tablet Take 1,000 mcg by mouth daily.     zolpidem (AMBIEN) 10 MG tablet 5 mg at bedtime.     estradiol (ESTRACE) 0.1 MG/GM vaginal cream Place 0.5g nightly for two weeks then twice a week after 30 g 11   phentermine 37.5 MG capsule Take 1 capsule (37.5 mg total) by mouth every morning. 30 capsule 5   Facility-Administered Medications Prior to Visit  Medication Dose Route Frequency Provider Last Rate Last Admin   gadopentetate dimeglumine (MAGNEVIST) injection 20 mL  20 mL Intravenous Once PRN Marvelene Stoneberg, Pearletha Furl, MD       gadopentetate dimeglumine (MAGNEVIST) injection 20 mL  20 mL Intravenous Once PRN Sylwia Cuervo, Pearletha Furl, MD        PAST MEDICAL HISTORY: Past Medical History:  Diagnosis Date   Anemia    Anxiety    Arthritis    oa   Back  pain    Bilateral swelling of feet    Cancer (HCC) 2018   right breast   Depression    Depression    Fatty liver    Fractured rib hx of 2018   healed   History of hiatal hernia    small per ct per pt   History of kidney stones yrs ago   passed on own   History of radiation therapy 06/02/16-07/14/16   right breast 50.4 Gy in 28 fractions   Joint pain    Memory loss    occ   Multiple sclerosis (HCC)    age 18   Shingles 2016   back   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   Urinary urgency    Wears glasses    Wears glasses     PAST SURGICAL HISTORY: Past Surgical History:  Procedure Laterality Date   ADENOIDECTOMY     BREAST LUMPECTOMY WITH RADIOACTIVE SEED AND SENTINEL LYMPH NODE BIOPSY Right 03/25/2016   Procedure: RIGHT BREAST SEED GUIDED PARTIAL MASTECTOMY WITH RIGHT SENITINEL LYMPH NODE MAPPING;  Surgeon: Harriette Bouillon, MD;  Location: [facility];  Service: General;  Laterality: Right;   BREAST RECONSTRUCTION Right 03/25/2016   Procedure: RIGHT BREAST RECONSTRUCTION;  Surgeon: Alena Bills Dillingham, DO;  Location: [facility];  Service: Plastics;  Laterality: Right;   BREAST REDUCTION SURGERY Right 03/25/2016   Procedure: MAMMARY REDUCTION  (BREAST) RIGHT;  Surgeon: Alena Bills Dillingham, DO;  Location: [facility];  Service: Plastics;  Laterality: Right;   BREAST REDUCTION SURGERY Left 01/06/2017   Procedure: LEFT MAMMARY REDUCTION  (BREAST) FOR SYMMETRY WITH LIPOSUCTION;  Surgeon: Peggye Form, DO;  Location: [facility];  Service: Plastics;  Laterality: Left;   BREAST SURGERY Left 01/25/2018   breast reduction   BUNIONECTOMY Bilateral 2011   pins in both big toes   CESAREAN SECTION     x 1   COLONOSCOPY W/ BIOPSIES AND POLYPECTOMY  2018 and 2021   f/u 5 yrs now   CYSTOSCOPY WITH RETROGRADE PYELOGRAM, URETEROSCOPY AND STENT PLACEMENT Right 05/13/2020   Procedure: CYSTOSCOPY WITH RETROGRADE PYELOGRAM, URETEROSCOPY AND STENT PLACEMENT;  Surgeon: Noel Christmas,  MD;  Location: [facility];  Service: Urology;  Laterality: Right;  1 HR   HOLMIUM LASER APPLICATION Right 05/13/2020   Procedure: HOLMIUM LASER APPLICATION;  Surgeon: Noel Christmas, MD;  [facility];  Service: Urology;  Laterality: Right;   INCISION AND DRAINAGE OF WOUND Right 04/21/2016   Procedure: IRRIGATION AND DEBRIDEMENT RIGHT BREAST WOUND;  Surgeon: Peggye Form, DO;  Location: [facility];  Service: Plastics;  Laterality: Right;   MASS EXCISION Left 02/24/2017   Procedure: LEFT BREAST WOUND EXCISION AND CLOSURE WITH ACELL APPLICATION;  Surgeon: Peggye Form, DO;  Location: [facility];  Service: Plastics;  Laterality: Left;   TONSILLECTOMY  age 48   and adenoids    FAMILY HISTORY: Family History  Problem Relation Age of Onset   Osteoporosis Mother    Heart disease Mother    Stroke Mother    COPD Mother    Cancer Father        colon   Heart disease Brother    Heart attack Brother     SOCIAL HISTORY:  Social History   Socioeconomic History   Marital status: Divorced    Spouse name: Not on file   Number of children: 1   Years of education: Not on file   Highest education level: Not on file  Occupational History   Occupation: Midwife   Occupation: Retired  Tobacco Use   Smoking status: Never   Smokeless tobacco: Never  Vaping Use   Vaping status: Never Used  Substance and Sexual Activity   Alcohol use: Not Currently    Comment: rare   Drug use: No   Sexual activity: Not Currently    Partners: Male    Birth control/protection: Condom    Comment: partner has had vasectomy  Other Topics Concern   Not on file  Social History Narrative   Not on file   Social Determinants of Health   Financial Resource Strain: Not on file  Food Insecurity: Not on file  Transportation Needs: Not on file  Physical Activity: Not on file  Stress: Not on file  Social Connections:  Unknown (06/21/2021)   Received from [facility], [facility]   Social Network    Social Network: Not on file  Intimate Partner Violence: Unknown (05/14/2021)   Received from [facility], [facility]   HITS    Physically Hurt: Not on file    Insult or Talk Down To: Not on file    Threaten Physical Harm: Not on file    Scream or Curse: Not on file     PHYSICAL EXAM  Vitals:   01/03/23 1022  BP: 139/75  Pulse: 76  Weight: 229 lb (103.9 kg)  Height: 5\' 3"  (1.6 m)     Body mass index is 40.57 kg/m.   General: She is a well-developed and well-nourished and in no acute distress  Musculoskeletal:   She has moderate lower cervical tenderness on the left and also in the left multifidus and trapezius muscle.  Mild reduced range of motion of the neck., worse looking to the left.     Neurologic Exam  Mental status: The patient is alert and oriented x 3 at the time of the examination. The patient has apparent normal recent and remote memory, with an apparently normal attention span and concentration ability.   Speech is normal.  Cranial nerves: Extraocular movements are full.  Facial strength and sensation is normal.  Trapezius strength is normal.  Hearing was normal.  Motor:  Muscle bulk and tone are normal. Strength is  5 / 5 in all 4 extremities.   Sensory: Intact sensation to touch and vibration in the arms and legs.  Coordination: Finger-nose-finger and heel-to-shin was performed well bilaterally.  Gait and station: Station and gait are normal.  Tandem gait is mildly wide.    Romberg negative,   Reflexes: Deep tendon reflexes are symmetric and brisk bilaterally but no ankle clonus.    Multiple sclerosis (HCC)  Posterior longitudinal ligament ossification (HCC)  Gait disturbance  Attention deficit disorder, unspecified type  Other fatigue  Urinary urgency    1.   Her MS has been stable on Gilenya lymphocytes were 0.3 03/2022 on qod dosing.   Will check  lymphocyte count and CMP.   Check MRI brain and c-spine to assess whether she has MS breakthrough.    We discussed Mavenclad --- consider further at next visit depending on MRi results. 2.   Continue Cymbalta 60 mg.  Continue Adderall. 3.   Stay active and exercise as tolerated. 4.    Return in 6 months or sooner if there are new or worsening neurologic symptoms.   This visit is part of a comprehensive longitudinal care medical relationship regarding the patients primary diagnosis of multiple sclerosis and related concerns.   Erynne Kealey A. Epimenio Foot, MD, PhD 01/03/2023, 11:16 AM Certified in Neurology, Clinical Neurophysiology, Sleep Medicine, Pain Medicine and Neuroimaging  [facility] [address], [address] [place], [place] [address] [phone]

## 2023-01-03 NOTE — Telephone Encounter (Signed)
Last seen on 06/24/22 Follow up scheduled 01/03/23 @ 11:00am

## 2023-01-04 ENCOUNTER — Other Ambulatory Visit: Payer: Self-pay | Admitting: *Deleted

## 2023-01-04 ENCOUNTER — Encounter: Payer: Self-pay | Admitting: Neurology

## 2023-01-04 LAB — CBC WITH DIFFERENTIAL/PLATELET
Basophils Absolute: 0 10*3/uL (ref 0.0–0.2)
Basos: 0 %
EOS (ABSOLUTE): 0.2 10*3/uL (ref 0.0–0.4)
Eos: 5 %
Hematocrit: 39.8 % (ref 34.0–46.6)
Hemoglobin: 13.2 g/dL (ref 11.1–15.9)
Immature Grans (Abs): 0 10*3/uL (ref 0.0–0.1)
Immature Granulocytes: 0 %
Lymphocytes Absolute: 0.3 10*3/uL — ABNORMAL LOW (ref 0.7–3.1)
Lymphs: 8 %
MCH: 30.5 pg (ref 26.6–33.0)
MCHC: 33.2 g/dL (ref 31.5–35.7)
MCV: 92 fL (ref 79–97)
Monocytes Absolute: 0.4 10*3/uL (ref 0.1–0.9)
Monocytes: 14 %
Neutrophils Absolute: 2.4 10*3/uL (ref 1.4–7.0)
Neutrophils: 73 %
Platelets: 205 10*3/uL (ref 150–450)
RBC: 4.33 x10E6/uL (ref 3.77–5.28)
RDW: 13.4 % (ref 11.7–15.4)
WBC: 3.3 10*3/uL — ABNORMAL LOW (ref 3.4–10.8)

## 2023-01-04 LAB — COMPREHENSIVE METABOLIC PANEL
ALT: 27 [IU]/L (ref 0–32)
AST: 33 [IU]/L (ref 0–40)
Albumin: 4.1 g/dL (ref 3.8–4.9)
Alkaline Phosphatase: 99 [IU]/L (ref 44–121)
BUN/Creatinine Ratio: 19 (ref 12–28)
BUN: 18 mg/dL (ref 8–27)
Bilirubin Total: 0.7 mg/dL (ref 0.0–1.2)
CO2: 23 mmol/L (ref 20–29)
Calcium: 8.8 mg/dL (ref 8.7–10.3)
Chloride: 106 mmol/L (ref 96–106)
Creatinine, Ser: 0.96 mg/dL (ref 0.57–1.00)
Globulin, Total: 1.8 g/dL (ref 1.5–4.5)
Glucose: 92 mg/dL (ref 70–99)
Potassium: 3.8 mmol/L (ref 3.5–5.2)
Sodium: 144 mmol/L (ref 134–144)
Total Protein: 5.9 g/dL — ABNORMAL LOW (ref 6.0–8.5)
eGFR: 68 mL/min/{1.73_m2} (ref >59)

## 2023-01-04 MED ORDER — MELOXICAM 7.5 MG PO TABS: 7.5000 mg | ORAL_TABLET | Freq: Every day | ORAL | 1 refills | Status: DC

## 2023-01-04 NOTE — Telephone Encounter (Signed)
Patient was seen on 01/03/23. Per note "Continue Cymbalta 60 mg. " Follow up scheduled on 07/21/23

## 2023-01-04 NOTE — Telephone Encounter (Signed)
Francine Graven Berkley [name]: [id number] exp. 12/23/22-02/21/23 forGI

## 2023-01-09 ENCOUNTER — Encounter: Payer: Self-pay | Admitting: Podiatry

## 2023-01-09 NOTE — Progress Notes (Signed)
Reviewed CT report.  There were cystic lesions at the prominent articulation between the anterior process of calcaneus and navicular, appearance favoring a fibrous C-N coalition.  Will recommend cortisone injection with ankle brace, but if no better, then will refer to Dr. 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

## 2023-01-18 ENCOUNTER — Ambulatory Visit: Payer: Medicare HMO | Admitting: Obstetrics and Gynecology

## 2023-01-20 ENCOUNTER — Ambulatory Visit
Admission: RE | Admit: 2023-01-20 | Discharge: 2023-01-20 | Disposition: A | Discharge status: Home / Self Care | Payer: Medicare HMO | Source: Ambulatory Visit | Attending: Neurology | Admitting: Neurology

## 2023-01-20 DIAGNOSIS — G35 Multiple sclerosis: Secondary | ICD-10-CM

## 2023-01-21 ENCOUNTER — Encounter: Payer: Self-pay | Admitting: Neurology

## 2023-03-02 ENCOUNTER — Other Ambulatory Visit: Payer: Self-pay | Admitting: Neurology

## 2023-03-02 ENCOUNTER — Other Ambulatory Visit: Payer: Self-pay

## 2023-03-02 NOTE — Telephone Encounter (Signed)
Pt Last Seen 01/03/2023 Upcoming Appointment 07/21/2023  Meloxicam Last Filled 02/03/2023 Escript 03/02/2023

## 2023-05-01 ENCOUNTER — Other Ambulatory Visit: Payer: Self-pay | Admitting: Neurology

## 2023-05-02 NOTE — Telephone Encounter (Signed)
 Last seen on 01/03/23  Follow up scheduled on 07/21/23

## 2023-05-14 ENCOUNTER — Other Ambulatory Visit: Payer: Self-pay | Admitting: Neurology

## 2023-05-16 MED ORDER — AMPHETAMINE-DEXTROAMPHETAMINE 15 MG PO TABS: 30.0000 mg | ORAL_TABLET | Freq: Every day | ORAL | 0 refills | Status: DC

## 2023-05-16 NOTE — Telephone Encounter (Signed)
 Last seen on 01/03/23 Follow up scheduled on 07/21/23 Last filled on 10/12/22 #60 tablets (30 day supply) Rx pending to be signed

## 2023-06-08 ENCOUNTER — Encounter: Payer: Self-pay | Admitting: Neurology

## 2023-06-16 ENCOUNTER — Other Ambulatory Visit: Payer: Self-pay | Admitting: Obstetrics and Gynecology

## 2023-06-16 DIAGNOSIS — N3281 Overactive bladder: Secondary | ICD-10-CM

## 2023-07-01 ENCOUNTER — Encounter: Payer: Self-pay | Admitting: Plastic Surgery

## 2023-07-01 ENCOUNTER — Ambulatory Visit (INDEPENDENT_AMBULATORY_CARE_PROVIDER_SITE_OTHER): Admitting: Plastic Surgery

## 2023-07-01 VITALS — BP 130/78 | HR 95 | Ht 64.0 in | Wt 230.6 lb

## 2023-07-01 DIAGNOSIS — M793 Panniculitis, unspecified: Secondary | ICD-10-CM | POA: Diagnosis not present

## 2023-07-01 DIAGNOSIS — M542 Cervicalgia: Secondary | ICD-10-CM

## 2023-07-01 DIAGNOSIS — G35 Multiple sclerosis: Secondary | ICD-10-CM

## 2023-07-01 NOTE — Progress Notes (Signed)
 Patient ID: Raven Montoya, female    DOB: 01/19/1961, 63 y.o.   MRN: [id number]   Chief Complaint  Patient presents with   Consult    The patient is a 63 year old female here for evaluation of her abdomen.  The patient has been working on weight reduction and doing a really good job.  She is 5 feet 4 inches tall and weighs 230 pounds.  Her goal is to get to 180 pounds.  She has been doing a lot of working out and exercise. The patient underwent breast reductions and had some trouble healing and had some wound debridement with VAC placement afterwards. The patient's past medical history is positive for depression, multiple sclerosis, neck pain and back pain, breast cancer, hyperlipidemia and renal insufficiency.  Her past surgical history includes breast surgery, foot surgery, C-section, tonsils and adenoids, neurologic stent placement.  She develops a lot of rashes in her skin folds.  She showed me pictures today and I got some pictures as well.  She has good abdominal muscle wall strength     Review of Systems  Constitutional: Negative.   HENT: Negative.    Eyes: Negative.   Respiratory: Negative.    Cardiovascular: Negative.   Gastrointestinal: Negative.   Endocrine: Negative.   Genitourinary: Negative.   Musculoskeletal: Negative.     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   Urinary urgency    Wears glasses    Wears glasses     Past  Surgical History:  Procedure Laterality Date   ADENOIDECTOMY     BREAST LUMPECTOMY WITH RADIOACTIVE SEED AND SENTINEL LYMPH NODE BIOPSY Right 03/25/2016   Procedure: RIGHT BREAST SEED GUIDED PARTIAL MASTECTOMY WITH RIGHT SENITINEL LYMPH NODE MAPPING;  Surgeon: Sim Dryer, MD;  Location: [facility];  Service: General;  Laterality: Right;   BREAST RECONSTRUCTION Right 03/25/2016   Procedure: RIGHT BREAST RECONSTRUCTION;  Surgeon: Lindaann Requena Ramiel Forti, DO;  Location: [facility];  Service: Plastics;  Laterality: Right;   BREAST REDUCTION SURGERY Right 03/25/2016   Procedure: MAMMARY REDUCTION  (BREAST) RIGHT;  Surgeon: Lindaann Requena Lorali Khamis, DO;  Location: [facility];  Service: Plastics;  Laterality: Right;   BREAST REDUCTION SURGERY Left 01/06/2017   Procedure: LEFT MAMMARY REDUCTION  (BREAST) FOR SYMMETRY WITH LIPOSUCTION;  Surgeon: Thornell Flirt, DO;  Location: [facility];  Service: Plastics;  Laterality: Left;   BREAST SURGERY Left 01/25/2018   breast reduction   BUNIONECTOMY Bilateral 2011   pins in both big toes   CESAREAN SECTION     x 1   COLONOSCOPY W/ BIOPSIES AND POLYPECTOMY  2018 and 2021   f/u 5  yrs now   CYSTOSCOPY WITH RETROGRADE PYELOGRAM, URETEROSCOPY AND STENT PLACEMENT Right 05/13/2020   Procedure: CYSTOSCOPY WITH RETROGRADE PYELOGRAM, URETEROSCOPY AND STENT PLACEMENT;  Surgeon: Roxane Copp, MD;  Location: [facility];  Service: Urology;  Laterality: Right;  1 HR   HOLMIUM LASER APPLICATION Right 05/13/2020   Procedure: HOLMIUM LASER APPLICATION;  Surgeon: Roxane Copp, MD;  Location: [facility];  Service: Urology;  Laterality: Right;   INCISION AND DRAINAGE OF WOUND Right 04/21/2016   Procedure: IRRIGATION AND DEBRIDEMENT RIGHT BREAST WOUND;  Surgeon: Thornell Flirt, DO;  Location: [facility];  Service: Plastics;  Laterality: Right;   MASS EXCISION Left 02/24/2017   Procedure: LEFT BREAST WOUND EXCISION AND CLOSURE  WITH ACELL APPLICATION;  Surgeon: Thornell Flirt, DO;  Location: [facility];  Service: Plastics;  Laterality: Left;   TONSILLECTOMY  age 65   and adenoids      Current Outpatient Medications:    amphetamine -dextroamphetamine  (ADDERALL) 15 MG tablet, Take 2 tablets by mouth daily., Disp: 60 tablet, Rfl: 0   busPIRone  (BUSPAR ) 15 MG tablet, Take 1 tablet (15 mg total) by mouth 2 (two) times daily., Disp: 180 tablet, Rfl: 3   CALCIUM CARBONATE PO, Take 250 mg by mouth daily., Disp: , Rfl:    cyclobenzaprine  (FLEXERIL ) 10 MG tablet, Take 1 tablet (10 mg total) by mouth at bedtime as needed., Disp: 90 tablet, Rfl: 3   DULoxetine  (CYMBALTA ) 60 MG capsule, TAKE 1 CAPSULE EVERY DAY, Disp: 90 capsule, Rfl: 2   ferrous sulfate 325 (65 FE) MG tablet, 325 mg daily with breakfast., Disp: , Rfl:    Fingolimod  HCl (GILENYA ) 0.5 MG CAPS, TAKE ONE CAPSULE BY MOUTH ONCE DAILY. MAY TAKE WITH OR WITHOUT FOOD. STORE AT ROOM TEMPERATURE., Disp: 90 capsule, Rfl: 3   furosemide  (LASIX ) 20 MG tablet, Take 1 tablet (20 mg total) by mouth daily., Disp: 30 tablet, Rfl: 3   HYDROcodone -acetaminophen  (NORCO/VICODIN) 5-325 MG tablet, Take 1-2 tablets by mouth every 6 (six) hours as needed., Disp: , Rfl:    IRON PO, Take 1 tablet by mouth 2 (two) times a week. occ, Disp: , Rfl:    meloxicam  (MOBIC ) 7.5 MG tablet, TAKE 1 TABLET BY MOUTH EVERY DAY, Disp: 30 tablet, Rfl: 1   potassium chloride  SA (KLOR-CON  M) 20 MEQ tablet, Take 1 tablet (20 mEq total) by mouth daily., Disp: 30 tablet, Rfl: 3   solifenacin  (VESICARE ) 5 MG tablet, Take 1 tablet (5 mg total) by mouth daily., Disp: 30 tablet, Rfl: 5   vitamin B-12 (CYANOCOBALAMIN) 1000 MCG tablet, Take 1,000 mcg by mouth daily., Disp: , Rfl:    zolpidem (AMBIEN) 10 MG tablet, 5 mg at bedtime., Disp: , Rfl:  No current facility-administered medications for this visit.  Facility-Administered Medications Ordered in Other Visits:    gadopentetate dimeglumine   (MAGNEVIST ) injection 20 mL, 20 mL, Intravenous, Once PRN, Sater, Richard A, MD   gadopentetate dimeglumine  (MAGNEVIST ) injection 20 mL, 20 mL, Intravenous, Once PRN, Sater, Sherida Dimmer, MD   Objective:   Vitals:   07/01/23 1048  BP: 130/78  Pulse: 95  SpO2: 100%    Physical Exam Vitals reviewed.  Constitutional:      Appearance: Normal appearance.  HENT:     Head: Atraumatic.  Cardiovascular:     Rate and Rhythm: Normal rate.     Pulses: Normal pulses.  Pulmonary:     Effort: Pulmonary effort is normal.  Abdominal:  General: There is no distension.     Palpations: Abdomen is soft.  Skin:    General: Skin is warm.     Capillary Refill: Capillary refill takes less than 2 seconds.  Neurological:     Mental Status: She is alert and oriented to person, place, and time.  Psychiatric:        Mood and Affect: Mood normal.        Behavior: Behavior normal.        Thought Content: Thought content normal.        Judgment: Judgment normal.     Assessment & Plan:  Neck pain  MS (multiple sclerosis) (HCC)  Panniculitis  We will [facility] the [facility].  I would like her to see if they can help with getting her to her goal before we do the surgery.  I will plan on seeing her back in 3 to 6 months.  I think she will be a good candidate for a panniculectomy with liposuction.  Pictures were obtained of the patient and placed in the chart with the patient's or guardian's permission.   Lindaann Requena Raizy Auzenne, DO

## 2023-07-06 DIAGNOSIS — Z0289 Encounter for other administrative examinations: Secondary | ICD-10-CM

## 2023-07-08 ENCOUNTER — Other Ambulatory Visit: Payer: Self-pay | Admitting: Neurology

## 2023-07-12 NOTE — Telephone Encounter (Signed)
 Last seen on 01/03/23 Follow up scheduled on 07/21/23  See 01/04/23 my chart message note.

## 2023-07-18 ENCOUNTER — Other Ambulatory Visit: Payer: Self-pay | Admitting: Obstetrics and Gynecology

## 2023-07-18 ENCOUNTER — Other Ambulatory Visit: Payer: Self-pay | Admitting: Neurology

## 2023-07-18 DIAGNOSIS — N3281 Overactive bladder: Secondary | ICD-10-CM

## 2023-07-21 ENCOUNTER — Encounter: Payer: Self-pay | Admitting: Neurology

## 2023-07-21 ENCOUNTER — Ambulatory Visit (INDEPENDENT_AMBULATORY_CARE_PROVIDER_SITE_OTHER): Payer: Medicare HMO | Admitting: Neurology

## 2023-07-21 VITALS — BP 117/72 | HR 80 | Ht 64.0 in | Wt 230.5 lb

## 2023-07-21 DIAGNOSIS — M488X9 Other specified spondylopathies, site unspecified: Secondary | ICD-10-CM

## 2023-07-21 DIAGNOSIS — G35 Multiple sclerosis: Secondary | ICD-10-CM | POA: Diagnosis not present

## 2023-07-21 DIAGNOSIS — R269 Unspecified abnormalities of gait and mobility: Secondary | ICD-10-CM

## 2023-07-21 DIAGNOSIS — Z79899 Other long term (current) drug therapy: Secondary | ICD-10-CM | POA: Diagnosis not present

## 2023-07-21 DIAGNOSIS — E559 Vitamin D deficiency, unspecified: Secondary | ICD-10-CM | POA: Diagnosis not present

## 2023-07-21 MED ORDER — AMPHETAMINE-DEXTROAMPHETAMINE 15 MG PO TABS: 30.0000 mg | ORAL_TABLET | Freq: Every day | ORAL | 0 refills | Status: DC

## 2023-07-21 NOTE — Progress Notes (Signed)
 [facility]  PATIENT: Raven Montoya DOB: 1960/02/27  REFERRING CLINICIAN: Channing Commander HISTORY FROM: Patient  REASON FOR VISIT: MS    HISTORICAL  CHIEF COMPLAINT:  Chief Complaint  Patient presents with   RM11/MS    Pt is here Alone. Pt states that Raven Montoya has balance issues. Pt states that Raven Montoya twisted her ankle on her left side a couple of months ago and every since then Raven Montoya has been having pain going up on her left side. Pt states that Raven Montoya gets a shooting pain if Raven Montoya tries to cross her legs.     HISTORY OF PRESENT ILLNESS:  Raven Montoya is a 63 y.o. woman with relapsing remitting MS.      Update  07/21/2023: Raven Montoya is on Gilenya  and tolerates it well. Due to low lymphocytes, Raven Montoya takes the Gilenya  qod.   Last 11/24 lymphocytes were 0.3.   Last brain and cervical spine MRI on 12/16/2020 showed no changes compared to the 01/13/18 MRI.      Raven Montoya feels neurologically stable.    No exacerbations.   Anxiety is better.    Her gait is mildly off balanced.    Raven Montoya needs to use a bannister on stairs.  Raven Montoya has mild left leg weakness and spasticity associated with pain.    Spasms and cramps occur at night when Raven Montoya has been more active but not most nights.    Raven Montoya has transient mild numbness in hands and feet but no persistent numbness,    Bladder function does well on solifenacin .  Raven Montoya has a history of kidney stones.     Vision is stable and Raven Montoya has recent examination.    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  Adderall has helped her attention deficit and fatigue.   Recently Raven Montoya is sleeping better with 1/2 Ambien nightly.  Raven Montoya has had worse depression the last couple weeks though notes no new stressors in life.    Raven Montoya is on duloxetine  60 mg with some benefit for depression.  Combination with bupropion has helped further.   Raven Montoya twisted her left ankle (11/24) and still has swelling many afternoons and pain .  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   Raven Montoya feels less safe stepping ito a  tub and on some surfaces.     Raven Montoya has neck pain predominantly on the left in the lower cervical paraspinal muscles, trapezius and adjacent muscles.  Range of motion is moderately reduced.   Meloxicam  was stopped due to elevated creatinine.      Steroid TPIs have helped her neck pain in the past for a couple months at a time.  Raven Montoya notes the ROM is reduced especially looking to the left.   Raven Montoya also has lumbar pain and reports one level jutting out (spondylolisthesis?)  Vit D had been low but was elevated to 107 on 50000 U weekly so not supplementing now.    MS History:   Raven Montoya was diagnosed with multiple sclerosis at age 62 after presenting with optic neuritis in the left eye. Her neurologist discussed with her that Raven Montoya probably had MS but no medication was started at that time. Vision got better over the next few weeks. A couple years later, Raven Montoya went to see Dr. Peyton Brash. He felt that the MS should be treated and Raven Montoya was started on Rebif. Raven Montoya remain on Rebif for many years   Raven Montoya had not had any major exacerbations while on Rebif but had needles fatigue.  In 2014, Raven Montoya switched to Gilenya . Raven Montoya tolerates Gilenya  well.   Imaging: MRI of the brain 01/11/2018 shows T2/FLAIR hyperintense foci in the periventricular, juxtacortical and deep white matter.  There were no new lesions.  No change compared to 01/25/2017.  MRI of the cervical spine 01/11/2018 showed a subtle focus to the left at C4.Aaron Aas  Raven Montoya had multilevel degenerative changes with a disc protrusion at C2-C3 towards the left, disc protrusion, posterior longitudinal ligament hypertrophy and facet hypertrophy at C3-C4 and C4-C5.  There is mild spinal stenosis at those levels.  MRI of the brain 01/25/2017 showed Multiple T2/FLAIR hyperintense foci in the periventricular, juxtacortical and deep white matter of both hemispheres in a pattern and configuration consistent with chronic demyelinating plaque associated with multiple sclerosis. None of the  foci appears to be acute and there is no change when compared to the 02/12/2015 MRI.  MRI brain 12/16/2020 was unchanged.  MRI cervical spine 12/16/2020 IMPRESSION: This MRI of the cervical spine without contrast shows the following: 1.  There are subtle T2 hyperintense foci within the spinal cord towards the left adjacent to C4 and possibly adjacent to C4-C5.  Both of these were present on the 2019 MRI.  They are consistent with chronic demyelinating plaque associated with multiple sclerosis. 2.   There is thickening of the posterior longitudinal ligament, possibly associated with ossification, from C2-C3 through C4-C5.  This causes mild spinal stenosis at C3-C4 and mild to moderate spinal stenosis at C4-C5 but no nerve root compression. 3.   At C5-C6, there is mild spinal stenosis due to left paramedian disc osteophyte complex causing moderate left foraminal narrowing but no nerve root compression. 4.   At C6-C7, there is a left paramedian disc protrusion causing mild left foraminal narrowing but no spinal stenosis or nerve root compression. 5.   Degenerative changes are stable compared to the 2019 MRI.  REVIEW OF SYSTEMS:  Constitutional: No fevers, chills, sweats, or change in appetite.   Raven Montoya has fatigue and mild insomnia (helped by Ambien) Eyes: No visual changes, double vision, eye pain Ear, nose and throat: No hearing loss, ear pain, nasal congestion, sore throat Cardiovascular: No chest pain, palpitations Respiratory:  No shortness of breath at rest or with exertion.   No wheezes GastrointestinaI: No nausea, vomiting, diarrhea, abdominal pain, fecal incontinence Genitourinary:  No dysuria, urinary retention or frequency.  No nocturia. Musculoskeletal: Neck and back pain as above Integumentary: No rash, pruritus, skin lesions Neurological: as above Psychiatric: as above.    Endocrine: No palpitations, diaphoresis, change in appetite, change in weigh or increased  thirst Hematologic/Lymphatic:  No anemia, purpura, petechiae. Allergic/Immunologic: No itchy/runny eyes, nasal congestion, recent allergic reactions, rashes  ALLERGIES: No Known Allergies   HOME MEDICATIONS: Outpatient Medications Prior to Visit  Medication Sig Dispense Refill   busPIRone  (BUSPAR ) 15 MG tablet Take 1 tablet (15 mg total) by mouth 2 (two) times daily. 180 tablet 3   CALCIUM CARBONATE PO Take 250 mg by mouth daily.     cyclobenzaprine  (FLEXERIL ) 10 MG tablet Take 1 tablet (10 mg total) by mouth at bedtime as needed. 90 tablet 3   DULoxetine  (CYMBALTA ) 60 MG capsule TAKE 1 CAPSULE EVERY DAY 90 capsule 2   ferrous sulfate 325 (65 FE) MG tablet 325 mg daily with breakfast.     Fingolimod  HCl (GILENYA ) 0.5 MG CAPS TAKE ONE CAPSULE BY MOUTH ONCE DAILY. MAY TAKE WITH OR WITHOUT FOOD. STORE AT ROOM TEMPERATURE. 90 capsule 3   furosemide  (LASIX )  20 MG tablet Take 1 tablet (20 mg total) by mouth daily. 30 tablet 3   HYDROcodone -acetaminophen  (NORCO/VICODIN) 5-325 MG tablet Take 1-2 tablets by mouth every 6 (six) hours as needed.     IRON PO Take 1 tablet by mouth 2 (two) times a week. occ     meloxicam  (MOBIC ) 7.5 MG tablet TAKE 1 TABLET BY MOUTH EVERY DAY 30 tablet 0   potassium chloride  SA (KLOR-CON  M) 20 MEQ tablet Take 1 tablet (20 mEq total) by mouth daily. 30 tablet 3   vitamin B-12 (CYANOCOBALAMIN) 1000 MCG tablet Take 1,000 mcg by mouth daily.     zolpidem (AMBIEN) 10 MG tablet 5 mg at bedtime.     amphetamine -dextroamphetamine  (ADDERALL) 15 MG tablet Take 2 tablets by mouth daily. 60 tablet 0   solifenacin  (VESICARE ) 5 MG tablet Take 1 tablet (5 mg total) by mouth daily. (Patient not taking: Reported on 07/21/2023) 30 tablet 5   Facility-Administered Medications Prior to Visit  Medication Dose Route Frequency Provider Last Rate Last Admin   gadopentetate dimeglumine  (MAGNEVIST ) injection 20 mL  20 mL Intravenous Once PRN Dontaye Hur, Sherida Dimmer, MD       gadopentetate dimeglumine   (MAGNEVIST ) injection 20 mL  20 mL Intravenous Once PRN Miquan Tandon, Sherida Dimmer, MD        PAST MEDICAL HISTORY: Past Medical History:  Diagnosis Date   Anemia    Anxiety    Arthritis    oa   Back pain    Bilateral swelling of feet    Cancer (HCC) 2018   right breast   Depression    Depression    Fatty liver    Fractured rib hx of 2018   healed   History of hiatal hernia    small per ct per pt   History of kidney stones yrs ago   passed on own   History of radiation therapy 06/02/16-07/14/16   right breast 50.4 Gy in 28 fractions   Joint pain    Memory loss    occ   Multiple sclerosis (HCC)    age 83   Shingles 2016   back   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   Urinary urgency    Wears glasses    Wears glasses     PAST SURGICAL HISTORY: Past Surgical History:  Procedure Laterality Date   ADENOIDECTOMY     BREAST LUMPECTOMY WITH RADIOACTIVE SEED AND SENTINEL LYMPH NODE BIOPSY Right 03/25/2016   Procedure: RIGHT BREAST SEED GUIDED PARTIAL MASTECTOMY WITH RIGHT SENITINEL LYMPH NODE MAPPING;  Surgeon: Sim Dryer, MD;  Location: [facility] OR;  Service: General;  Laterality: Right;   BREAST RECONSTRUCTION Right 03/25/2016   Procedure: RIGHT BREAST RECONSTRUCTION;  Surgeon: Lindaann Requena Dillingham, DO;  Location: [facility] OR;  Service: Plastics;  Laterality: Right;   BREAST REDUCTION SURGERY Right 03/25/2016   Procedure: MAMMARY REDUCTION  (BREAST) RIGHT;  Surgeon: Lindaann Requena Dillingham, DO;  Location: [facility] OR;  Service: Plastics;  Laterality: Right;   BREAST REDUCTION SURGERY Left 01/06/2017   Procedure: LEFT MAMMARY REDUCTION  (BREAST) FOR SYMMETRY WITH LIPOSUCTION;  Surgeon: Thornell Flirt, DO;  Location: [facility];  Service: Plastics;  Laterality: Left;   BREAST SURGERY Left 01/25/2018   breast reduction   BUNIONECTOMY Bilateral 2011   pins in both big toes   CESAREAN SECTION     x 1   COLONOSCOPY W/ BIOPSIES AND POLYPECTOMY  2018 and 2021   f/u 5 yrs  now    CYSTOSCOPY WITH RETROGRADE PYELOGRAM, URETEROSCOPY AND STENT PLACEMENT Right 05/13/2020   Procedure: CYSTOSCOPY WITH RETROGRADE PYELOGRAM, URETEROSCOPY AND STENT PLACEMENT;  Surgeon: Roxane Copp, MD;  Location: [facility];  Service: Urology;  Laterality: Right;  1 HR   HOLMIUM LASER APPLICATION Right 05/13/2020   Procedure: HOLMIUM LASER APPLICATION;  Surgeon: Roxane Copp, MD;  Location: [facility];  Service: Urology;  Laterality: Right;   INCISION AND DRAINAGE OF WOUND Right 04/21/2016   Procedure: IRRIGATION AND DEBRIDEMENT RIGHT BREAST WOUND;  Surgeon: Thornell Flirt, DO;  Location: [facility];  Service: Plastics;  Laterality: Right;   MASS EXCISION Left 02/24/2017   Procedure: LEFT BREAST WOUND EXCISION AND CLOSURE WITH ACELL APPLICATION;  Surgeon: Thornell Flirt, DO;  Location:  SURGERY CENTER;  Service: Plastics;  Laterality: Left;   TONSILLECTOMY  age 44   and adenoids    FAMILY HISTORY: Family History  Problem Relation Age of Onset   Osteoporosis Mother    Heart disease Mother    Stroke Mother    COPD Mother    Cancer Father        colon   Heart disease Brother    Heart attack Brother     SOCIAL HISTORY:  Social History   Socioeconomic History   Marital status: Divorced    Spouse name: Not on file   Number of children: 1   Years of education: Not on file   Highest education level: Not on file  Occupational History   Occupation: Midwife   Occupation: Retired  Tobacco Use   Smoking status: Never   Smokeless tobacco: Never  Vaping Use   Vaping status: Never Used  Substance and Sexual Activity   Alcohol use: Not Currently    Comment: rare   Drug use: No   Sexual activity: Not Currently    Partners: Male    Birth control/protection: Condom    Comment: partner has had vasectomy  Other Topics Concern   Not on file  Social History Narrative   Not on file   Social Drivers of Health    Financial Resource Strain: Not on file  Food Insecurity: Not on file  Transportation Needs: Not on file  Physical Activity: Not on file  Stress: Not on file  Social Connections: Unknown (06/21/2021)   Received from [facility]   Social Network    Social Network: Not on file  Intimate Partner Violence: Unknown (05/14/2021)   Received from [facility]   HITS    Physically Hurt: Not on file    Insult or Talk Down To: Not on file    Threaten Physical Harm: Not on file    Scream or Curse: Not on file     PHYSICAL EXAM  Vitals:   07/21/23 1145  BP: 117/72  Pulse: 80  SpO2: 97%  Weight: 230 lb 8 oz (104.6 kg)  Height: 5' 4 (1.626 m)      Body mass index is 39.57 kg/m.   General: Raven Montoya is a well-developed and well-nourished and in no acute distress  Musculoskeletal:   Raven Montoya has moderate lower cervical tenderness on the left and also in the left multifidus and trapezius muscle.  Mild reduced range of motion of the neck., worse looking to the left.     Neurologic Exam  Mental status: The patient is alert and oriented x 3 at the time of the examination. The patient has apparent normal recent  and remote memory, with an apparently normal attention span and concentration ability.   Speech is normal.  Cranial nerves: Extraocular movements are full.  Facial strength and sensation is normal.  Trapezius strength is normal.  Hearing was normal.  Motor:  Muscle bulk and tone are normal. Strength is  5 / 5 in both arms and right leg.  Strength is 4/5 left iliopsoas and toe extension.   .   Sensory: Intact sensation to touch and vibration in the arms and legs.  Coordination: Finger-nose-finger and heel-to-shin was performed well bilaterally.  Gait and station: Station is normal.   Raven Montoya has a mild left foot drop.  The tandem gait is wide.  Romberg negative,   Reflexes: Deep tendon reflexes are symmetric and brisk bilaterally but no ankle clonus.    MS (multiple sclerosis) (HCC) -  Plan: CBC with Differential/Platelet  High risk medication use - Plan: CBC with Differential/Platelet  Vitamin D  deficiency - Plan: VITAMIN D  25 Hydroxy (Vit-D Deficiency, Fractures)  Posterior longitudinal ligament ossification (HCC)  Gait disturbance    1.   Her MS has been stable on Gilenya  lymphocytes were 0.3 03/2022 on qod dosing.   Will check lymphocyte count and CMP.   Check MRI brain and c-spine to assess whether Raven Montoya has MS breakthrough.    We discussed Mavenclad --- consider further at next visit depending on MRi results. 2.   Continue Cymbalta  60 mg.  Continue Adderall. 3.   Stay active and exercise as tolerated. 4.    Will check vitamin D  to determine dose of supplementation.   Return in 6 months or sooner if there are new or worsening neurologic symptoms.   This visit is part of a comprehensive longitudinal care medical relationship regarding the patients primary diagnosis of multiple sclerosis and related concerns.   Dia Jefferys A. Godwin Lat, MD, PhD 07/21/2023, 4:33 PM Certified in Neurology, Clinical Neurophysiology, Sleep Medicine, Pain Medicine and Neuroimaging  [facility] [address] [place], [place] [address] [phone]

## 2023-07-22 LAB — CBC WITH DIFFERENTIAL/PLATELET
Basophils Absolute: 0 10*3/uL (ref 0.0–0.2)
Basos: 0 %
EOS (ABSOLUTE): 0.2 10*3/uL (ref 0.0–0.4)
Eos: 6 %
Hematocrit: 44.8 % (ref 34.0–46.6)
Hemoglobin: 14.4 g/dL (ref 11.1–15.9)
Immature Grans (Abs): 0 10*3/uL (ref 0.0–0.1)
Immature Granulocytes: 0 %
Lymphocytes Absolute: 0.3 10*3/uL — ABNORMAL LOW (ref 0.7–3.1)
Lymphs: 8 %
MCH: 31.5 pg (ref 26.6–33.0)
MCHC: 32.1 g/dL (ref 31.5–35.7)
MCV: 98 fL — ABNORMAL HIGH (ref 79–97)
Monocytes Absolute: 0.5 10*3/uL (ref 0.1–0.9)
Monocytes: 13 %
Neutrophils Absolute: 2.8 10*3/uL (ref 1.4–7.0)
Neutrophils: 72 %
Platelets: 260 10*3/uL (ref 150–450)
RBC: 4.57 x10E6/uL (ref 3.77–5.28)
RDW: 14.8 % (ref 11.7–15.4)
WBC: 3.8 10*3/uL (ref 3.4–10.8)

## 2023-07-22 LAB — VITAMIN D 25 HYDROXY (VIT D DEFICIENCY, FRACTURES): Vit D, 25-Hydroxy: 45.9 ng/mL (ref 30.0–100.0)

## 2023-07-26 ENCOUNTER — Ambulatory Visit: Payer: Self-pay | Admitting: Neurology

## 2023-08-03 ENCOUNTER — Ambulatory Visit (INDEPENDENT_AMBULATORY_CARE_PROVIDER_SITE_OTHER): Admitting: Family Medicine

## 2023-08-03 ENCOUNTER — Encounter: Payer: Self-pay | Admitting: Neurology

## 2023-08-03 ENCOUNTER — Encounter: Payer: Self-pay | Admitting: Family Medicine

## 2023-08-03 VITALS — BP 123/78 | HR 85 | Temp 97.7°F | Ht 64.0 in | Wt 231.0 lb

## 2023-08-03 DIAGNOSIS — M79672 Pain in left foot: Secondary | ICD-10-CM | POA: Diagnosis not present

## 2023-08-03 DIAGNOSIS — E7849 Other hyperlipidemia: Secondary | ICD-10-CM

## 2023-08-03 DIAGNOSIS — G35 Multiple sclerosis: Secondary | ICD-10-CM | POA: Diagnosis not present

## 2023-08-03 DIAGNOSIS — R739 Hyperglycemia, unspecified: Secondary | ICD-10-CM

## 2023-08-03 DIAGNOSIS — E66812 Obesity, class 2: Secondary | ICD-10-CM

## 2023-08-03 DIAGNOSIS — R5383 Other fatigue: Secondary | ICD-10-CM | POA: Diagnosis not present

## 2023-08-03 DIAGNOSIS — Z6839 Body mass index (BMI) 39.0-39.9, adult: Secondary | ICD-10-CM

## 2023-08-03 DIAGNOSIS — R0602 Shortness of breath: Secondary | ICD-10-CM | POA: Diagnosis not present

## 2023-08-03 NOTE — Progress Notes (Signed)
 At a Glance:  Vitals Temp: 97.7 F (36.5 C) BP: 123/78 Pulse Rate: 85 SpO2: 97 %   Anthropometric Measurements Height: 5' 4 (1.626 m) Weight: 231 lb (104.8 kg) BMI (Calculated): 39.63 Starting Weight: 321lb   Body Composition  Body Fat %: 43 % Fat Mass (lbs): 99.4 lbs Muscle Mass (lbs): 125 lbs Total Body Water (lbs): 90.2 lbs Visceral Fat Rating : 14   Other Clinical Data RMR: 1728 Fasting: Yes Labs: Yes Today's Visit #: 1 Starting Date: 08/03/23    EKG: Normal sinus rhythm, rate 85. Occasional ectopic beats  Indirect Calorimeter completed today shows a VO2 of 250 and a REE of 1728.  Her calculated basal metabolic rate is 8178 thus her basal metabolic rate is worse than expected. ( Previous REE from Oct 2023 was 1858 kcal/ day)  Chief Complaint:  Obesity   Subjective:  Raven Montoya (MR# [id number]) is a 63 y.o. female who presents for evaluation and treatment of obesity and related comorbidities.   Raven Montoya is currently in the action stage of change and ready to dedicate time achieving and maintaining a healthier weight. Raven Montoya is interested in becoming our patient and working on intensive lifestyle modifications including (but not limited to) diet and exercise for weight loss.  Raven Montoya has been struggling with her weight. She has been unsuccessful in either losing weight, maintaining weight loss, or reaching her healthy weight goal.  She was seen in our [place] office having lost ~25 lb in over a year without AOMs but was lost to follow up for the past 15 mos after having issues with L foot and knee pain, followed by [facility] orthopedics.  She has fallen off of her gym workouts.  She denies large portion sizes but makes poor food choices and skip breakfast often.  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  Raven Montoya habits were reviewed today and are as follows: she thinks her family will eat healthier with her,  her desired weight loss is 40-50 lb, she started gaining weight after having kids, she has significant food cravings issues, she frequently makes poor food choices, and she struggles with emotional eating.  She has relapsing remitting MS, managed by Dr Vear with associated fatigue.   Other Fatigue Raven Montoya admits to daytime somnolence and admits to waking up still tired. Patient has a history of symptoms of morning fatigue. Raven Montoya generally gets 5 hours of sleep per night, and states that she has difficulty falling asleep. Snoring is present. Apneic episodes are not present. Epworth Sleepiness Score is 4.   Shortness of Breath Raven Montoya notes increasing shortness of breath with exercising and seems to be worsening over time with weight gain. She notes getting out of breath sooner with activity than she used to. This has not gotten worse recently. Diannie denies shortness of breath at rest or orthopnea.   Depression Screen Raven Montoya Food and Mood (modified PHQ-9) score was 14.     11/12/2021    8:30 AM  Depression screen PHQ 2/9  Decreased Interest 1  Down, Depressed, Hopeless 2  PHQ - 2 Score 3  Altered sleeping 2  Tired, decreased energy 3  Change in appetite 2  Feeling bad or failure about yourself  1  Trouble concentrating 0  Moving slowly or fidgety/restless 0  Suicidal thoughts 1  PHQ-9 Score 12  Difficult doing work/chores Somewhat difficult     Assessment and Plan:   Other Fatigue  Raven Montoya does feel that her weight is causing her energy to be lower than it should be. Fatigue may be related to obesity, depression or many other causes. Labs will be ordered, and in the meanwhile, Raven Montoya will focus on self care including making healthy food choices, increasing physical activity and focusing on stress reduction.  Shortness of Breath Raven Montoya does feel that she gets out of breath more easily that she used to when she exercises. Raven Montoya shortness of breath appears to be obesity  related and exercise induced. She has agreed to work on weight loss and gradually increase exercise to treat her exercise induced shortness of breath. Will continue to monitor closely.  Kynlie had a positive depression screening. Depression is commonly associated with obesity and often results in emotional eating behaviors. We will monitor this closely and work on CBT to help improve the non-hunger eating patterns. Referral to Psychology may be required if no improvement is seen as she continues in our clinic.    Problem List Items Addressed This Visit     MS (multiple sclerosis) (HCC) Managed by Dr Vear, on Gilenya  0.5 mg capsule daily C/o fatigue but denies weakness in extremities or physical limitations    Other fatigue Reviewed EKG - WNL with patient BP/ HR WNL MS likely a contributing factor Lacking adequate sleep at night   Relevant Orders   EKG 12-Lead (Completed)   TSH   T4, free   T3   Folate   Comprehensive metabolic panel with GFR   Vitamin B12   Other Visit Diagnoses       SOBOE (shortness of breath on exertion)    -  Primary     Other hyperlipidemia     Lab Results  Component Value Date   CHOL 209 (H) 03/31/2022   HDL 59 03/31/2022   LDLCALC 118 (H) 03/31/2022   TRIG 184 (H) 03/31/2022   CHOLHDL 3.4 10/17/2012   Not on any lipid lowering medications Begin prescribed diet, low in saturated fat and sugar Check labs today The 10-year ASCVD risk score (Arnett DK, et al., 2019) is: 3.3%   Values used to calculate the score:     Age: 70 years     Clincally relevant sex: Female     Is Non-Hispanic African American: No     Diabetic: No     Tobacco smoker: No     Systolic Blood Pressure: 123 mmHg     Is BP treated: No     HDL Cholesterol: 59 mg/dL     Total Cholesterol: 209 mg/dL    Relevant Orders   Lipid panel     Hyperglycemia     Denies hx of T2DM or PDM Begin prescribed diet with a plan for resuming gym workouts in the next 3 mos   Relevant Orders    Insulin , random   Hemoglobin A1c     Class 2 severe obesity due to excess calories with serious comorbidity and body mass index (BMI) of 39.0 to 39.9 in adult [facility])         Left foot pain     Has follow up scheduled with [facility] Not in PT.  Has limited exercise for a year with imaging only confirming arthritis Look for improvements in foot and knee pain with weight reduction We discussed water exercise, bike, yoga, strengthening exercises to get started       Larua is currently in the action stage of change and her goal is to get back to  weightloss efforts . I recommend Shadonna begin the structured treatment plan as follows:  She has agreed to Category 3 Plan  Exercise goals: All adults should avoid inactivity. Some activity is better than none, and adults who participate in any amount of physical activity, gain some health benefits.  Behavioral modification strategies:increasing lean protein intake, increase H2O intake, increase high fiber foods, meal planning and cooking strategies, keeping healthy foods in the home, better snacking choices, planning for success, and decrease junk food   She was informed of the importance of frequent follow-up visits to maximize her success with intensive lifestyle modifications for her multiple health conditions. She was informed we would discuss her lab results at her next visit unless there is a critical issue that needs to be addressed sooner. Aslynn agreed to keep her next visit at the agreed upon time to discuss these results.  Objective:  General: Cooperative, alert, well developed, in no acute distress. HEENT: Conjunctivae and lids unremarkable. Cardiovascular: Regular rhythm.  Lungs: Normal work of breathing. Neurologic: No focal deficits.   Lab Results  Component Value Date   CREATININE 0.96 01/03/2023   BUN 18 01/03/2023   NA 144 01/03/2023   K 3.8 01/03/2023   CL 106 01/03/2023   CO2 23 01/03/2023   Lab Results   Component Value Date   ALT 27 01/03/2023   AST 33 01/03/2023   ALKPHOS 99 01/03/2023   BILITOT 0.7 01/03/2023   Lab Results  Component Value Date   HGBA1C 4.9 03/31/2022   HGBA1C 5.1 11/12/2021   HGBA1C 5.4 10/17/2012   Lab Results  Component Value Date   INSULIN  10.1 03/31/2022   INSULIN  15.6 11/12/2021   Lab Results  Component Value Date   TSH 1.950 03/31/2022   Lab Results  Component Value Date   CHOL 209 (H) 03/31/2022   HDL 59 03/31/2022   LDLCALC 118 (H) 03/31/2022   TRIG 184 (H) 03/31/2022   CHOLHDL 3.4 10/17/2012   Lab Results  Component Value Date   WBC 3.8 07/21/2023   HGB 14.4 07/21/2023   HCT 44.8 07/21/2023   MCV 98 (H) 07/21/2023   PLT 260 07/21/2023   Lab Results  Component Value Date   IRON 26 (L) 10/17/2012   TIBC 440 10/17/2012   FERRITIN 4 (L) 10/17/2012    Attestation Statements:  Reviewed by clinician on day of visit: allergies, medications, problem list, medical history, surgical history, family history, social history, and previous encounter notes.  Time spent on visit including pre-visit chart review and post-visit charting and face- to face care including nutritional counseling, review of EKG, interpretation of body composition scale and indirect calorimetry and nutrition prescription  was 42 minutes.   Darice Haddock, D.O. DABFM, DABOM [facility] [address] [place], [place] [address] [phone]

## 2023-08-04 ENCOUNTER — Other Ambulatory Visit: Payer: Self-pay

## 2023-08-04 ENCOUNTER — Ambulatory Visit: Payer: Self-pay | Admitting: Family Medicine

## 2023-08-04 LAB — COMPREHENSIVE METABOLIC PANEL WITH GFR
ALT: 25 IU/L (ref 0–32)
AST: 32 IU/L (ref 0–40)
Albumin: 4.5 g/dL (ref 3.9–4.9)
Alkaline Phosphatase: 86 IU/L (ref 44–121)
BUN/Creatinine Ratio: 28 (ref 12–28)
BUN: 31 mg/dL — ABNORMAL HIGH (ref 8–27)
Bilirubin Total: 0.6 mg/dL (ref 0.0–1.2)
CO2: 23 mmol/L (ref 20–29)
Calcium: 9.1 mg/dL (ref 8.7–10.3)
Chloride: 100 mmol/L (ref 96–106)
Creatinine, Ser: 1.09 mg/dL — ABNORMAL HIGH (ref 0.57–1.00)
Globulin, Total: 1.9 g/dL (ref 1.5–4.5)
Glucose: 96 mg/dL (ref 70–99)
Potassium: 4.8 mmol/L (ref 3.5–5.2)
Sodium: 140 mmol/L (ref 134–144)
Total Protein: 6.4 g/dL (ref 6.0–8.5)
eGFR: 58 mL/min/{1.73_m2} — ABNORMAL LOW (ref >59)

## 2023-08-04 LAB — HEMOGLOBIN A1C
Est. average glucose Bld gHb Est-mCnc: 108 mg/dL
Hgb A1c MFr Bld: 5.4 % (ref 4.8–5.6)

## 2023-08-04 LAB — LIPID PANEL
Chol/HDL Ratio: 3.5 ratio (ref 0.0–4.4)
Cholesterol, Total: 170 mg/dL (ref 100–199)
HDL: 49 mg/dL (ref >39)
LDL Chol Calc (NIH): 87 mg/dL (ref 0–99)
Triglycerides: 199 mg/dL — ABNORMAL HIGH (ref 0–149)
VLDL Cholesterol Cal: 34 mg/dL (ref 5–40)

## 2023-08-04 LAB — T3: T3, Total: 122 ng/dL (ref 71–180)

## 2023-08-04 LAB — FOLATE: Folate: >20 ng/mL (ref >3.0)

## 2023-08-04 LAB — T4, FREE: Free T4: 1.19 ng/dL (ref 0.82–1.77)

## 2023-08-04 LAB — INSULIN, RANDOM: INSULIN: 22.3 u[IU]/mL (ref 2.6–24.9)

## 2023-08-04 LAB — VITAMIN B12: Vitamin B-12: >2000 pg/mL — ABNORMAL HIGH (ref 232–1245)

## 2023-08-04 LAB — TSH: TSH: 2.04 u[IU]/mL (ref 0.450–4.500)

## 2023-08-04 MED ORDER — CYCLOBENZAPRINE HCL 10 MG PO TABS: 10.0000 mg | ORAL_TABLET | Freq: Every evening | ORAL | 3 refills | Status: AC | PRN

## 2023-08-11 ENCOUNTER — Other Ambulatory Visit: Payer: Self-pay | Admitting: Neurology

## 2023-08-11 NOTE — Telephone Encounter (Signed)
 Last seen on 07/21/23 Follow up scheduled on 02/13/24

## 2023-08-17 ENCOUNTER — Encounter: Payer: Self-pay | Admitting: Family Medicine

## 2023-08-17 ENCOUNTER — Ambulatory Visit (INDEPENDENT_AMBULATORY_CARE_PROVIDER_SITE_OTHER): Admitting: Family Medicine

## 2023-08-17 VITALS — BP 128/78 | HR 80 | Temp 98.0°F | Ht 64.0 in | Wt 226.0 lb

## 2023-08-17 DIAGNOSIS — E782 Mixed hyperlipidemia: Secondary | ICD-10-CM

## 2023-08-17 DIAGNOSIS — E88819 Insulin resistance, unspecified: Secondary | ICD-10-CM

## 2023-08-17 DIAGNOSIS — Z6838 Body mass index (BMI) 38.0-38.9, adult: Secondary | ICD-10-CM

## 2023-08-17 DIAGNOSIS — E678 Other specified hyperalimentation: Secondary | ICD-10-CM

## 2023-08-17 DIAGNOSIS — N1831 Chronic kidney disease, stage 3a: Secondary | ICD-10-CM | POA: Diagnosis not present

## 2023-08-17 DIAGNOSIS — E66812 Obesity, class 2: Secondary | ICD-10-CM

## 2023-08-17 MED ORDER — METFORMIN HCL 500 MG PO TABS: 500.0000 mg | ORAL_TABLET | Freq: Every day | ORAL | 0 refills | Status: DC

## 2023-08-17 NOTE — Patient Instructions (Addendum)
 You can add more non starchy veggies to your day (add to eggs, add more salad greens)  Remember you have a snack to use mid afternoon   Start with upper body resistance training 3 days/ wk  Follow up with PCP for L knee pain and ? Kidney stones  Continue cat 3 meal plan  Keep up the good work!

## 2023-08-17 NOTE — Progress Notes (Signed)
 Office: [phone]  /  Fax: [phone]  WEIGHT SUMMARY AND BIOMETRICS  Starting Date: 08/03/23  Starting Weight: 321lb   Weight Lost Since Last Visit: 5lb   Vitals Temp: 98 F (36.7 C) BP: 128/78 Pulse Rate: 80 SpO2: 97 %   Body Composition  Body Fat %: 43.1 % Fat Mass (lbs): 97.4 lbs Muscle Mass (lbs): 122.2 lbs Total Body Water (lbs): 89.2 lbs Visceral Fat Rating : 14     HPI  Chief Complaint: OBESITY  Raven Montoya is here to discuss her progress with her obesity treatment plan. She is on the the Category 3 Plan and states she is following her eating plan approximately 75 % of the time. She states she is exercising 0 minutes 0 times per week.  Interval History:  Since last office visit she is down 5 lb She did add in breakfast and has been hungry between lunch and dinner She has been mindful of food choices She is skipping her listed snacks She drinks water and diet drinks Support at home has been good Her boyfriend is supportive She has had L knee pain, unable to walk for exercise Hunger and cravings are under good control  Pharmacotherapy: none  PHYSICAL EXAM:  Blood pressure 128/78, pulse 80, temperature 98 F (36.7 C), height 5' 4 (1.626 m), weight 226 lb (102.5 kg), last menstrual period 12/30/2014, SpO2 97%. Body mass index is 38.79 kg/m.  General: She is overweight, cooperative, alert, well developed, and in no acute distress. PSYCH: Has normal mood, affect and thought process.   Lungs: Normal breathing effort, no conversational dyspnea.  ASSESSMENT AND PLAN  TREATMENT PLAN FOR OBESITY:  Recommended Dietary Goals  Raven Montoya is currently in the action stage of change. As such, her goal is to continue weight management plan. She has agreed to the Category 3 Plan.  Behavioral Intervention  We discussed the following Behavioral Modification Strategies today: increasing lean protein intake to established goals, increasing fiber rich foods,  increasing water intake , work on meal planning and preparation, keeping healthy foods at home, work on managing stress, creating time for self-care and relaxation, avoiding temptations and identifying enticing environmental cues, continue to practice mindfulness when eating, planning for success, and continue to work on maintaining a reduced calorie state, getting the recommended amount of protein, incorporating whole foods, making healthy choices, staying well hydrated and practicing mindfulness when eating..  Additional resources provided today: NA  Recommended Physical Activity Goals  Raven Montoya has been advised to work up to 150 minutes of moderate intensity aerobic activity a week and strengthening exercises 2-3 times per week for cardiovascular health, weight loss maintenance and preservation of muscle mass.   She has agreed to Think about enjoyable ways to increase daily physical activity and overcoming barriers to exercise and Increase physical activity in their day and reduce sedentary time (increase NEAT). - add in upper body resistance training from home 3 days/ wk  Pharmacotherapy changes for the treatment of obesity: none  ASSOCIATED CONDITIONS ADDRESSED TODAY  Insulin  resistance Fasting insulin  elevated at 22.3 on recent labs Explained that hyperinsulinemia can contribute to polyphagia, body fat deposition and type II diabetes She will continue to limit added sugar and starches while adding in some resistance training Walking limited by knee pain, plans to see ortho Begin metformin  500 mg daily with food.  Repeat labs in 1-2 mos -     metFORMIN  HCl; Take 1 tablet (500 mg total) by mouth daily with breakfast.  Dispense: 30 tablet;  Refill: 0  Class 2 obesity due to excess calories with body mass index (BMI) of 38.0 to 38.9 in adult, unspecified whether serious comorbidity present  Excessive vitamin B12 intake Reviewed labs She has been taking OTC vitamin B12 1000 mcg daily She  denies feeling poorly  Reduce vitamin B12 to every other day and repeat lab in 3-4 mos  Stage 3a chronic kidney disease (HCC) Lab Results  Component Value Date   CREATININE 1.09 (H) 08/03/2023   GFR 58, unchanged from previous readings She notes a hx of nephrolithiasis, currently dealing with flank pain and frequent urination Seeing PCP for workup this week Hydrate well with water  Elevated cholesterol with high triglycerides Lab Results  Component Value Date   CHOL 170 08/03/2023   HDL 49 08/03/2023   LDLCALC 87 08/03/2023   TRIG 199 (H) 08/03/2023   CHOLHDL 3.5 08/03/2023   Tgs have been mildly elevated Not on lipid lowering meds Look for improvements with a low sugar/ low saturated fat diet     She was informed of the importance of frequent follow up visits to maximize her success with intensive lifestyle modifications for her multiple health conditions.   ATTESTASTION STATEMENTS:  Reviewed by clinician on day of visit: allergies, medications, problem list, medical history, surgical history, family history, social history, and previous encounter notes pertinent to obesity diagnosis.   I have personally spent 40 minutes total time today in preparation, patient care, nutritional counseling and education,  and documentation for this visit, including the following: review of most recent clinical lab tests, prescribing medications/ refilling medications, reviewing medical assistant documentation, review and interpretation of bioimpedence results.     Raven Montoya, D.O. DABFM, DABOM [facility] [address] [place], [place] [address] [phone]

## 2023-08-23 ENCOUNTER — Telehealth: Payer: Self-pay | Admitting: *Deleted

## 2023-08-23 ENCOUNTER — Encounter: Payer: Self-pay | Admitting: Neurology

## 2023-08-23 ENCOUNTER — Other Ambulatory Visit: Payer: Self-pay | Admitting: *Deleted

## 2023-08-23 DIAGNOSIS — G35 Multiple sclerosis: Secondary | ICD-10-CM

## 2023-08-23 MED ORDER — FINGOLIMOD HCL 0.5 MG PO CAPS: 0.5000 mg | ORAL_CAPSULE | ORAL | 3 refills | Status: AC

## 2023-08-23 NOTE — Telephone Encounter (Signed)
 E-scribed rx to [facility]. EPIC states PA needed for fingolimod 

## 2023-08-24 ENCOUNTER — Telehealth: Payer: Self-pay

## 2023-08-24 ENCOUNTER — Other Ambulatory Visit (HOSPITAL_COMMUNITY): Payer: Self-pay

## 2023-08-24 NOTE — Telephone Encounter (Signed)
 Pharmacy Patient Advocate Encounter   Received notification from Physician's Office that prior authorization for Fingolimod  HCl 0.5MG  capsules is required/requested.   Insurance verification completed.   The patient is insured through [employer] .   Per test claim: The current 60 day co-pay is, $153.16.  No PA needed at this time. This test claim was processed through [facility]- copay amounts may vary at other pharmacies due to pharmacy/plan contracts, or as the patient moves through the different stages of their insurance plan.     30DS IS $106.16 90DS is $200.16

## 2023-08-24 NOTE — Telephone Encounter (Signed)
 Sent mychart to pt.

## 2023-09-01 ENCOUNTER — Encounter: Payer: Self-pay | Admitting: Neurology

## 2023-09-08 ENCOUNTER — Ambulatory Visit (INDEPENDENT_AMBULATORY_CARE_PROVIDER_SITE_OTHER): Admitting: Family Medicine

## 2023-09-08 ENCOUNTER — Encounter: Payer: Self-pay | Admitting: Family Medicine

## 2023-09-08 VITALS — BP 109/72 | HR 74 | Temp 98.3°F | Ht 64.0 in | Wt 222.0 lb

## 2023-09-08 DIAGNOSIS — E65 Localized adiposity: Secondary | ICD-10-CM | POA: Diagnosis not present

## 2023-09-08 DIAGNOSIS — M1712 Unilateral primary osteoarthritis, left knee: Secondary | ICD-10-CM | POA: Diagnosis not present

## 2023-09-08 DIAGNOSIS — Z7289 Other problems related to lifestyle: Secondary | ICD-10-CM | POA: Diagnosis not present

## 2023-09-08 DIAGNOSIS — Z9189 Other specified personal risk factors, not elsewhere classified: Secondary | ICD-10-CM

## 2023-09-08 DIAGNOSIS — E88819 Insulin resistance, unspecified: Secondary | ICD-10-CM | POA: Diagnosis not present

## 2023-09-08 DIAGNOSIS — Z6838 Body mass index (BMI) 38.0-38.9, adult: Secondary | ICD-10-CM

## 2023-09-08 DIAGNOSIS — E66812 Obesity, class 2: Secondary | ICD-10-CM

## 2023-09-08 MED ORDER — METFORMIN HCL 500 MG PO TABS: 500.0000 mg | ORAL_TABLET | Freq: Every day | ORAL | 0 refills | Status: DC

## 2023-09-08 NOTE — Progress Notes (Signed)
 Office: [phone]  /  Fax: [phone]  WEIGHT SUMMARY AND BIOMETRICS  Starting Date: 08/03/22  Starting Weight: 321lb   Weight Lost Since Last Visit: 4lb   Vitals Temp: 98.3 F (36.8 C) BP: 109/72 Pulse Rate: 74 SpO2: 100 %   Body Composition  Body Fat %: 46.5 % Fat Mass (lbs): 103.4 lbs Muscle Mass (lbs): 112.8 lbs Total Body Water (lbs): 86.8 lbs Visceral Fat Rating : 14    HPI  Chief Complaint: OBESITY  Raven Montoya is here to discuss her progress with her obesity treatment plan. She is on the the Category 3 Plan and states she is following her eating plan approximately 80 % of the time. She states she is exercising 0 minutes 0 times per week.  Interval History:  Since last office visit she is down 4 lb This gives her a net weight loss of 9 lb in 5 weeks of medically supervised weight management She is doing well improving adherence to cat 3 meal plan She is struggling to drink enough water due to incontinence issues - seeing PCP tomorrow for this She did add in a healthy afternoon snack She has some cravings for sugar at night- trying [employer] bars (able to eat just one) Stress levels have been high She is off SSBs She has not been exercising, with continued L knee pain She got a L knee brace yesterday- seeing [facility]  Pharmacotherapy: Metformin  500 mg once daily  PHYSICAL EXAM:  Blood pressure 109/72, pulse 74, temperature 98.3 F (36.8 C), height 5' 4 (1.626 m), weight 222 lb (100.7 kg), last menstrual period 12/30/2014, SpO2 100%. Body mass index is 38.11 kg/m.  General: She is overweight, cooperative, alert, well developed, and in no acute distress. PSYCH: Has normal mood, affect and thought process.   Lungs: Normal breathing effort, no conversational dyspnea.  ASSESSMENT AND PLAN  TREATMENT PLAN FOR OBESITY:  Recommended Dietary Goals  Raven Montoya is currently in the action stage of change. As such, her goal is to continue weight  management plan. She has agreed to the Category 3 Plan. Reviewed specific snack options on after visit summary  Behavioral Intervention  We discussed the following Behavioral Modification Strategies today: increasing lean protein intake to established goals, increasing fiber rich foods, increasing water intake , work on meal planning and preparation, keeping healthy foods at home, practice mindfulness eating and understand the difference between hunger signals and cravings, work on managing stress, creating time for self-care and relaxation, avoiding temptations and identifying enticing environmental cues, and continue to work on maintaining a reduced calorie state, getting the recommended amount of protein, incorporating whole foods, making healthy choices, staying well hydrated and practicing mindfulness when eating..  Additional resources provided today: NA  Recommended Physical Activity Goals  Rmani has been advised to work up to 150 minutes of moderate intensity aerobic activity a week and strengthening exercises 2-3 times per week for cardiovascular health, weight loss maintenance and preservation of muscle mass.   She has agreed to Think about enjoyable ways to increase daily physical activity and overcoming barriers to exercise and Increase physical activity in their day and reduce sedentary time (increase NEAT). We set a goal for gym workouts 2 to 3 days a week  Pharmacotherapy changes for the treatment of obesity: None  ASSOCIATED CONDITIONS ADDRESSED TODAY  Insulin  resistance Doing well with new start metformin  500 mg once daily.  Currently taking with breakfast with little bit of loose stool.  She agrees to moving her  metformin  500 mg to dinner.  Continue to work on weight reduction, reducing added sugar and refined carbohydrates.  Will plan to increase exercise frequency to 30 minutes at least 2 to 3 days a week.  Repeat fasting insulin , BMP and B12 level in 2 months -      metFORMIN  HCl; Take 1 tablet (500 mg total) by mouth daily with supper.  Dispense: 30 tablet; Refill: 0  Class 2 severe obesity due to excess calories with serious comorbidity and body mass index (BMI) of 38.0 to 38.9 in adult [facility]) Improving.  Reviewed bioimpedance results with patient  Primary osteoarthritis of left knee Follow-up with Ortho next week for a knee brace that she receives is currently not wearing.  She does plan to get back into the gym once she is able to use her knee brace.  She feels this has helped with her weight reduction.  She is able to keep up her activities of daily living.  She has a prescription for meloxicam  7.5 mg daily to use as needed.  Central adiposity Unchanged.  Visceral fat rating remains elevated at 14.  She has sequelae of visceral adiposity including insulin  resistance and elevated triglycerides.  Continue current plan of care with diet exercise changes on a reduced calorie high-protein low carbohydrate/low sugar diet, adding in more regular exercise.  Sedentary lifestyle Unchanged.  She plans to ramp up exercise once her bladder issue is addressed.  She is seeing urology tomorrow.  She appears to have incomplete bladder emptying with incontinence.  She is at risk for neurogenic bladder with MS.  In addition, her left knee pain has been hindering her ability to exercise.  She has upcoming visit with Ortho and hopes to start using her knee brace.     She was informed of the importance of frequent follow up visits to maximize her success with intensive lifestyle modifications for her multiple health conditions.   ATTESTASTION STATEMENTS:  Reviewed by clinician on day of visit: allergies, medications, problem list, medical history, surgical history, family history, social history, and previous encounter notes pertinent to obesity diagnosis.   I have personally spent 30 minutes total time today in preparation, patient care, nutritional counseling and  education,  and documentation for this visit, including the following: review of most recent clinical lab tests, prescribing medications/ refilling medications, reviewing medical assistant documentation, review and interpretation of bioimpedence results.     Darice Haddock, D.O. DABFM, DABOM [facility] [address] [place], [place] [address] [phone]

## 2023-09-08 NOTE — Patient Instructions (Addendum)
 Allow one nighttime snack: Fiber One Brownie  [employer] Halo Top Popsicle  Breyer's carb smart bar [employer] job with fruits and veggies  Follow up with urology and orthopedics Plan for the gym 2-3 days/ wk  Move Metformin  500 mg to dinner

## 2023-09-19 ENCOUNTER — Other Ambulatory Visit: Payer: Self-pay | Admitting: *Deleted

## 2023-09-19 ENCOUNTER — Other Ambulatory Visit: Payer: Self-pay | Admitting: Neurology

## 2023-09-19 DIAGNOSIS — R5383 Other fatigue: Secondary | ICD-10-CM

## 2023-09-19 DIAGNOSIS — G35 Multiple sclerosis: Secondary | ICD-10-CM

## 2023-09-19 MED ORDER — AMPHETAMINE-DEXTROAMPHETAMINE 15 MG PO TABS: 30.0000 mg | ORAL_TABLET | Freq: Every day | ORAL | 0 refills | Status: DC

## 2023-09-19 MED ORDER — AMPHETAMINE-DEXTROAMPHETAMINE 15 MG PO TABS
30.0000 mg | ORAL_TABLET | Freq: Every day | ORAL | 0 refills | Status: DC
Start: 2023-09-19 — End: 2023-09-19

## 2023-09-19 NOTE — Telephone Encounter (Signed)
 Dr. Vear we received a fax from [facility] that Adderall 15 mg tablet needs diagnosis coded attached to Rx.    DEXTROAMP-AMPHETAMIN 15 MG TAB 07/21/2023 30  Sater, Richard A, MD [facility] [phone] - R...    I have Rx pending to be signed with diagnosis attached to Rx.

## 2023-09-28 ENCOUNTER — Encounter: Payer: Self-pay | Admitting: Family Medicine

## 2023-09-28 ENCOUNTER — Ambulatory Visit (INDEPENDENT_AMBULATORY_CARE_PROVIDER_SITE_OTHER): Admitting: Family Medicine

## 2023-09-28 VITALS — BP 113/77 | HR 79 | Temp 98.0°F | Ht 64.0 in | Wt 215.0 lb

## 2023-09-28 DIAGNOSIS — Z7289 Other problems related to lifestyle: Secondary | ICD-10-CM

## 2023-09-28 DIAGNOSIS — E88819 Insulin resistance, unspecified: Secondary | ICD-10-CM

## 2023-09-28 DIAGNOSIS — E66812 Obesity, class 2: Secondary | ICD-10-CM

## 2023-09-28 DIAGNOSIS — N1831 Chronic kidney disease, stage 3a: Secondary | ICD-10-CM

## 2023-09-28 DIAGNOSIS — Z6836 Body mass index (BMI) 36.0-36.9, adult: Secondary | ICD-10-CM

## 2023-09-28 DIAGNOSIS — M1712 Unilateral primary osteoarthritis, left knee: Secondary | ICD-10-CM

## 2023-09-28 DIAGNOSIS — Z9189 Other specified personal risk factors, not elsewhere classified: Secondary | ICD-10-CM

## 2023-09-28 MED ORDER — METFORMIN HCL 500 MG PO TABS: 500.0000 mg | ORAL_TABLET | Freq: Every day | ORAL | 0 refills | Status: DC

## 2023-09-28 NOTE — Progress Notes (Signed)
 Office: [phone]  /  Fax: [phone]  WEIGHT SUMMARY AND BIOMETRICS  Starting Date: 08/03/22  Starting Weight: 321lb   Weight Lost Since Last Visit: 7lb   Vitals Temp: 98 F (36.7 C) BP: 113/77 Pulse Rate: 79 SpO2: 97 %   Body Composition  Body Fat %: 41.5 % Fat Mass (lbs): 89.4 lbs Muscle Mass (lbs): 119.8 lbs Total Body Water (lbs): 81.8 lbs Visceral Fat Rating : 13    HPI  Chief Complaint: OBESITY  Raven Montoya is here to discuss her progress with her obesity treatment plan. She is on the the Category 3 Plan and states she is following her eating plan approximately  95% of the time. She states she is exercising 0 minutes 0 times per week.  Interval History:  Since last office visit she is down 7 lb This gives her a net weight loss of 16 lb in 8 wks This is a 7.1% total body weight loss She has cut back on sweets She is getting in all of her meals working or meal planning  She is getting in fruits and veggies Doing well on Metformin  500 mg once daily Her boyfriend lives apart-- he is not a bad eating influence on her She is not doing any regular exercise with upcoming urology visit for urinary incontinence She plans to go back to the gym but L knee pain continues to hurt her  Pharmacotherapy: metformin  500 mg daily  PHYSICAL EXAM:  Blood pressure 113/77, pulse 79, temperature 98 F (36.7 C), height 5' 4 (1.626 m), weight 215 lb (97.5 kg), last menstrual period 12/30/2014, SpO2 97%. Body mass index is 36.9 kg/m.  General: She is overweight, cooperative, alert, well developed, and in no acute distress. PSYCH: Has normal mood, affect and thought process.   Lungs: Normal breathing effort, no conversational dyspnea.   ASSESSMENT AND PLAN  TREATMENT PLAN FOR OBESITY:  Recommended Dietary Goals  Raven Montoya is currently in the action stage of change. As such, her goal is to continue weight management plan. She has agreed to the Category 3  Plan.  Behavioral Intervention  We discussed the following Behavioral Modification Strategies today: increasing lean protein intake to established goals, increasing fiber rich foods, increasing water intake , work on meal planning and preparation, work on Counselling psychologist calories using tracking application, keeping healthy foods at home, practice mindfulness eating and understand the difference between hunger signals and cravings, avoiding temptations and identifying enticing environmental cues, planning for success, and continue to work on maintaining a reduced calorie state, getting the recommended amount of protein, incorporating whole foods, making healthy choices, staying well hydrated and practicing mindfulness when eating..  Additional resources provided today: NA  Recommended Physical Activity Goals  Raven Montoya has been advised to work up to 150 minutes of moderate intensity aerobic activity a week and strengthening exercises 2-3 times per week for cardiovascular health, weight loss maintenance and preservation of muscle mass.   She has agreed to Increase physical activity in their day and reduce sedentary time (increase NEAT). and Start strengthening exercises with a goal of 2-3 sessions a week   Pharmacotherapy changes for the treatment of obesity: None  ASSOCIATED CONDITIONS ADDRESSED TODAY  Insulin  resistance Last fasting insulin  elevated at 22.3 on 08/03/2023.  Doing well on metformin  500 mg once daily with dinner.  Repeat labs next visit to include fasting insulin , BMP and B12.  Continue to work on weight reduction, reducing added sugar and starches.  Plan to improve exercise frequency. -  metFORMIN  HCl; Take 1 tablet (500 mg total) by mouth daily with supper.  Dispense: 30 tablet; Refill: 0  Class 2 severe obesity due to excess calories with serious comorbidity and body mass index (BMI) of 36.0 to 36.9 in adult [facility]) Improving.  Reviewed bioimpedance results with patient   Continue prescribed dietary plan  Primary osteoarthritis of left knee Stable.  Using a left knee brace, prescribed by orthopedics.  Hopes to increase her exercise frequency using her knee brace.  She has had corticosteroid injection without relief of pain and uses Mobic  once daily.  Continue active plan for weight reduction.  Begin use of a recumbent bike and body weight training for exercise.  Plan to start physical therapy  Sedentary lifestyle Unchanged Motivation has improved with weight loss to take out her local gym  Stage 3a chronic kidney disease (HCC) Lab Results  Component Value Date   CREATININE 1.09 (H) 08/03/2023  Monitor renal function next visit on metformin  500 mg daily, Meloxicam  7.5 mg daily and advised her to use caution with taking 'diuretics given by previous obesity medicine clinic'  1 x a week prn leg edema.  Improve hydration.    She was informed of the importance of frequent follow up visits to maximize her success with intensive lifestyle modifications for her multiple health conditions.   ATTESTASTION STATEMENTS:  Reviewed by clinician on day of visit: allergies, medications, problem list, medical history, surgical history, family history, social history, and previous encounter notes pertinent to obesity diagnosis.   I have personally spent 30 minutes total time today in preparation, patient care, nutritional counseling and education,  and documentation for this visit, including the following: review of most recent clinical lab tests, prescribing medications/ refilling medications, reviewing medical assistant documentation, review and interpretation of bioimpedence results.     Darice Haddock, D.O. DABFM, DABOM [facility] [address] [place], [place] [address] [phone]

## 2023-10-04 ENCOUNTER — Ambulatory Visit: Admitting: Plastic Surgery

## 2023-10-06 ENCOUNTER — Ambulatory Visit: Admitting: Plastic Surgery

## 2023-10-06 VITALS — BP 120/75 | HR 92 | Wt 224.0 lb

## 2023-10-06 DIAGNOSIS — M549 Dorsalgia, unspecified: Secondary | ICD-10-CM

## 2023-10-06 DIAGNOSIS — M542 Cervicalgia: Secondary | ICD-10-CM | POA: Diagnosis not present

## 2023-10-06 DIAGNOSIS — M793 Panniculitis, unspecified: Secondary | ICD-10-CM | POA: Diagnosis not present

## 2023-10-06 DIAGNOSIS — Z6837 Body mass index (BMI) 37.0-37.9, adult: Secondary | ICD-10-CM

## 2023-10-06 DIAGNOSIS — G35 Multiple sclerosis: Secondary | ICD-10-CM

## 2023-10-06 DIAGNOSIS — E669 Obesity, unspecified: Secondary | ICD-10-CM

## 2023-10-06 DIAGNOSIS — R21 Rash and other nonspecific skin eruption: Secondary | ICD-10-CM | POA: Diagnosis not present

## 2023-10-06 NOTE — Progress Notes (Signed)
 Patient ID: Raven Montoya, female    DOB: 05-13-1960, 63 y.o.   MRN: [id number]   Chief Complaint  Patient presents with   Follow-up    The patient is a 63 year old female here for follow-up on her abdomen.  She has been working on weight reduction.  She is 5 feet 4 inches tall.  At her last visit in May she was 230 pounds.  Today she is 224 pounds.  She has been working out and exercising in order to lose the weight.  She has had a breast reduction in the past with some difficulty healing.  Her past medical history is positive for multiple sclerosis, neck pain back pain, breast cancer, hyperlipidemia and renal insufficiency.  Past surgical history includes breast surgery, foot surgery, C-section, tonsil colectomy, and a neurologic stent placement.  She complains of back pain neck pain and rashes in her skin folds.  She is now involved in the healthy weight and wellness program and losing weight.  She would like some more time to work on the weight reduction.  She still having skin breakdown and has some pictures of that that she is going to try and get into my chart.    Review of Systems  Constitutional: Negative.   Eyes: Negative.   Respiratory: Negative.  Negative for chest tightness.   Cardiovascular: Negative.   Gastrointestinal: Negative.   Endocrine: Negative.   Genitourinary: Negative.   Musculoskeletal: Negative.     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   Urinary urgency    Wears glasses    Wears glasses     Past Surgical History:  Procedure Laterality Date   ADENOIDECTOMY     BREAST LUMPECTOMY WITH RADIOACTIVE SEED AND SENTINEL LYMPH NODE BIOPSY Right 03/25/2016   Procedure: RIGHT BREAST SEED GUIDED PARTIAL MASTECTOMY WITH RIGHT SENITINEL LYMPH NODE MAPPING;  Surgeon: Debby Shipper, MD;  Location: [facility];  Service: General;  Laterality: Right;   BREAST RECONSTRUCTION Right 03/25/2016   Procedure: RIGHT BREAST RECONSTRUCTION;  Surgeon: Estefana RAMAN Angellynn Kimberlin, DO;  Location: [facility] OR;  Service: Plastics;  Laterality: Right;   BREAST REDUCTION SURGERY Right 03/25/2016   Procedure: MAMMARY REDUCTION  (BREAST) RIGHT;  Surgeon: Estefana RAMAN Maleia Weems, DO;  Location: [facility] OR;  Service: Plastics;  Laterality: Right;   BREAST REDUCTION SURGERY Left 01/06/2017   Procedure: LEFT MAMMARY REDUCTION  (BREAST) FOR SYMMETRY WITH LIPOSUCTION;  Surgeon: Lowery Estefana RAMAN, DO;  Location: [facility];  Service: Plastics;  Laterality: Left;   BREAST SURGERY Left 01/25/2018   breast reduction   BUNIONECTOMY Bilateral 2011   pins in both big toes  CESAREAN SECTION     x 1   COLONOSCOPY W/ BIOPSIES AND POLYPECTOMY  2018 and 2021   f/u 5 yrs now   CYSTOSCOPY WITH RETROGRADE PYELOGRAM, URETEROSCOPY AND STENT PLACEMENT Right 05/13/2020   Procedure: CYSTOSCOPY WITH RETROGRADE PYELOGRAM, URETEROSCOPY AND STENT PLACEMENT;  Surgeon: Elisabeth Valli BIRCH, MD;  Location: [facility];  Service: Urology;  Laterality: Right;  1 HR   HOLMIUM LASER APPLICATION Right 05/13/2020   Procedure: HOLMIUM LASER APPLICATION;  Surgeon: Elisabeth Valli BIRCH, MD;  [facility];  Service: Urology;  Laterality: Right;   INCISION AND DRAINAGE OF WOUND Right 04/21/2016   Procedure: IRRIGATION AND DEBRIDEMENT RIGHT BREAST WOUND;  Surgeon: Estefana GORMAN Fritter, DO;  Location: [facility];  Service: Plastics;  Laterality:  Right;   MASS EXCISION Left 02/24/2017   Procedure: LEFT BREAST WOUND EXCISION AND CLOSURE WITH ACELL APPLICATION;  Surgeon: Fritter Estefana GORMAN, DO;  Location: [facility];  Service: Plastics;  Laterality: Left;   TONSILLECTOMY  age 12   and adenoids      Current Outpatient Medications:    amphetamine -dextroamphetamine  (ADDERALL) 15 MG tablet, Take 2 tablets by mouth daily., Disp: 60 tablet, Rfl: 0   busPIRone  (BUSPAR ) 15 MG tablet, Take 1 tablet (15 mg total) by mouth 2 (two) times daily., Disp: 180 tablet, Rfl: 3   CALCIUM CARBONATE PO, Take 250 mg by mouth daily., Disp: , Rfl:    cyclobenzaprine  (FLEXERIL ) 10 MG tablet, Take 1 tablet (10 mg total) by mouth at bedtime as needed., Disp: 90 tablet, Rfl: 3   DULoxetine  (CYMBALTA ) 60 MG capsule, TAKE 1 CAPSULE EVERY DAY, Disp: 90 capsule, Rfl: 1   Fingolimod  HCl (GILENYA ) 0.5 MG CAPS, Take 1 capsule (0.5 mg total) by mouth every other day. MAY TAKE WITH OR WITHOUT FOOD. STORE AT ROOM TEMPERATURE., Disp: 45 capsule, Rfl: 3   HYDROcodone -acetaminophen  (NORCO/VICODIN) 5-325 MG tablet, Take 1-2 tablets by mouth every 6 (six) hours as needed., Disp: , Rfl:    IRON PO, Take 1 tablet by mouth 2 (two) times a week. occ, Disp: , Rfl:    meloxicam  (MOBIC ) 7.5 MG tablet, TAKE 1 TABLET BY MOUTH EVERY DAY, Disp: 30 tablet, Rfl: 0   metFORMIN  (GLUCOPHAGE ) 500 MG tablet, Take 1 tablet (500 mg total) by mouth daily with supper., Disp: 30 tablet, Rfl: 0   zolpidem (AMBIEN) 10 MG tablet, 5 mg at bedtime., Disp: , Rfl:  No current facility-administered medications for this visit.  Facility-Administered Medications Ordered in Other Visits:    gadopentetate dimeglumine  (MAGNEVIST ) injection 20 mL, 20 mL, Intravenous, Once PRN, Sater, Charlie LABOR, MD   gadopentetate dimeglumine  (MAGNEVIST ) injection 20 mL, 20 mL, Intravenous, Once PRN, Sater, Charlie LABOR, MD   Objective:   Vitals:   10/06/23 1041  BP: 120/75  Pulse: 92  SpO2: 97%    Physical  Exam Vitals reviewed.  Constitutional:      Appearance: Normal appearance.  HENT:     Head: Atraumatic.  Cardiovascular:     Rate and Rhythm: Normal rate.     Pulses: Normal pulses.  Abdominal:     Palpations: Abdomen is soft.  Skin:    General: Skin is warm.     Capillary Refill: Capillary refill takes less than 2 seconds.  Neurological:     Mental Status: She is alert and oriented to person, place, and time.  Psychiatric:        Mood and Affect: Mood normal.  Behavior: Behavior normal.        Thought Content: Thought content normal.        Judgment: Judgment normal.     Assessment & Plan:  MS (multiple sclerosis) (HCC)  BMI 37.0-37.9, adult Current BMI 37.6  Obesity- Start BMI 39.5  Panniculitis  Plan to follow-up in 2 months and reevaluate.  She understands that getting to her ideal weight is important as to have the best long-term postoperative surgical result.  Pictures were obtained of the patient and placed in the chart with the patient's or guardian's permission.   Estefana RAMAN Railee Bonillas, DO

## 2023-10-25 ENCOUNTER — Encounter: Payer: Self-pay | Admitting: Family Medicine

## 2023-10-25 ENCOUNTER — Ambulatory Visit (INDEPENDENT_AMBULATORY_CARE_PROVIDER_SITE_OTHER): Admitting: Family Medicine

## 2023-10-25 ENCOUNTER — Telehealth: Payer: Self-pay | Admitting: Student

## 2023-10-25 VITALS — BP 110/73 | HR 70 | Temp 98.0°F | Ht 64.0 in | Wt 215.0 lb

## 2023-10-25 DIAGNOSIS — R5383 Other fatigue: Secondary | ICD-10-CM

## 2023-10-25 DIAGNOSIS — E66812 Obesity, class 2: Secondary | ICD-10-CM

## 2023-10-25 DIAGNOSIS — E88819 Insulin resistance, unspecified: Secondary | ICD-10-CM | POA: Diagnosis not present

## 2023-10-25 DIAGNOSIS — Z6836 Body mass index (BMI) 36.0-36.9, adult: Secondary | ICD-10-CM

## 2023-10-25 DIAGNOSIS — K5901 Slow transit constipation: Secondary | ICD-10-CM

## 2023-10-25 DIAGNOSIS — M1712 Unilateral primary osteoarthritis, left knee: Secondary | ICD-10-CM

## 2023-10-25 NOTE — Telephone Encounter (Signed)
 PA out of office pt will need to be r/s to a diff day called [employer]

## 2023-10-25 NOTE — Progress Notes (Signed)
 Office: [phone]  /  Fax: [phone]  WEIGHT SUMMARY AND BIOMETRICS  Starting Date: 08/03/22  Starting Weight: 321lb   Weight Lost Since Last Visit: 0lb   Vitals Temp: 98 F (36.7 C) BP: 110/73 Pulse Rate: 70 SpO2: 100 %   Body Composition  Body Fat %: 43.2 % Fat Mass (lbs): 93.2 lbs Muscle Mass (lbs): 116.2 lbs Total Body Water (lbs): 85.4 lbs Visceral Fat Rating : 13    HPI  Chief Complaint: OBESITY  Raven Montoya is here to discuss her progress with her obesity treatment plan. She is on the the Category 3 Plan and states she is following her eating plan approximately 50 % of the time. She states she is exercising 0 minutes 0 times per week.  Interval History:  Since last office visit she is down 0 lb This gives her a net weight loss of 16 pounds in the past 2-1/2 months of medically supervised weight management since restarting her program in June This is a 6.9% total body weight loss She went to [employer] x 4 days last week She has been a bit more constipated since traveling She is back on improved eating habits since returning She hasn't been able to exercise due to continued L knee pain She is going to see [facility] for continues L ankle and L knee pain and swelling This has hindered her ability to add in walking/ workouts She did start on metformin  500 mg daily, tolerating well  Pharmacotherapy: metformin  500 mg daily for insulin  resistance  PHYSICAL EXAM:  Blood pressure 110/73, pulse 70, temperature 98 F (36.7 C), height 5' 4 (1.626 m), weight 215 lb (97.5 kg), last menstrual period 12/30/2014, SpO2 100%. Body mass index is 36.9 kg/m.  General: She is overweight, cooperative, alert, well developed, and in no acute distress. PSYCH: Has normal mood, affect and thought process.   Lungs: Normal breathing effort, no conversational dyspnea.   ASSESSMENT AND PLAN  TREATMENT PLAN FOR OBESITY:  Recommended Dietary Goals  Raven Montoya is  currently in the action stage of change. As such, her goal is to continue weight management plan. She has agreed to the Category 3 Plan.  Behavioral Intervention  We discussed the following Behavioral Modification Strategies today: increasing lean protein intake to established goals, increasing fiber rich foods, increasing water intake , work on meal planning and preparation, keeping healthy foods at home, decreasing eating out or consumption of processed foods, and making healthy choices when eating convenient foods, avoiding temptations and identifying enticing environmental cues, planning for success, and getting back on track after recent relapse.  Additional resources provided today: NA  Recommended Physical Activity Goals  Raven Montoya has been advised to work up to 150 minutes of moderate intensity aerobic activity a week and strengthening exercises 2-3 times per week for cardiovascular health, weight loss maintenance and preservation of muscle mass.   She has agreed to Think about enjoyable ways to increase daily physical activity and overcoming barriers to exercise and Increase physical activity in their day and reduce sedentary time (increase NEAT).  Pharmacotherapy changes for the treatment of obesity: None  ASSOCIATED CONDITIONS ADDRESSED TODAY  Insulin  resistance 08/03/2023 fasting insulin  elevated at 22.3.  She is doing well with new start metformin  500 mg once daily without adverse side effect.  She is working on weight reduction.  Exercise has been limited due to joint pain.  She is working on her prescribed meal plan, low in added sugar and starches.  Continue current  plan of care.  Consider increasing metformin  dose based on lab results today. -     Basic metabolic panel with GFR -     Insulin , random  Other fatigue -     Vitamin B12  Class 2 severe obesity due to excess calories with serious comorbidity and body mass index (BMI) of 36.0 to 36.9 in adult (HCC)  Slow transit  constipation Worsened with recent travel. Recommend increasing water intake to at least 64 ounces per day, increasing intake of fruits and vegetable intake for added fiber.  She has over-the-counter laxatives to use if needed.  Primary osteoarthritis of left knee She is awaiting follow-up with [facility] following increased left knee pain that has limited her ability to walk for exercise.  This started after twisting her left ankle.  She still has some issues with left ankle pain and swelling as well.  Keep follow-up visits as scheduled with orthopedics.  She will hopefully be able to add in more regular exercise once her joint pain has resolved.     She was informed of the importance of frequent follow up visits to maximize her success with intensive lifestyle modifications for her multiple health conditions.   ATTESTASTION STATEMENTS:  Reviewed by clinician on day of visit: allergies, medications, problem list, medical history, surgical history, family history, social history, and previous encounter notes pertinent to obesity diagnosis.   I have personally spent 30 minutes total time today in preparation, patient care, nutritional counseling and education,  and documentation for this visit, including the following: review of most recent clinical lab tests, prescribing medications/ refilling medications, reviewing medical assistant documentation, review and interpretation of bioimpedence results.     Darice Haddock, D.O. DABFM, DABOM [facility] [address] [place], [place] [address] [phone]

## 2023-10-26 ENCOUNTER — Ambulatory Visit: Payer: Self-pay | Admitting: Family Medicine

## 2023-10-26 LAB — BASIC METABOLIC PANEL WITH GFR
BUN/Creatinine Ratio: 17 (ref 12–28)
BUN: 19 mg/dL (ref 8–27)
CO2: 22 mmol/L (ref 20–29)
Calcium: 9.8 mg/dL (ref 8.7–10.3)
Chloride: 99 mmol/L (ref 96–106)
Creatinine, Ser: 1.13 mg/dL — ABNORMAL HIGH (ref 0.57–1.00)
Glucose: 88 mg/dL (ref 70–99)
Potassium: 4.5 mmol/L (ref 3.5–5.2)
Sodium: 140 mmol/L (ref 134–144)
eGFR: 55 mL/min/1.73 — ABNORMAL LOW (ref >59)

## 2023-10-26 LAB — VITAMIN B12: Vitamin B-12: >2000 pg/mL — ABNORMAL HIGH (ref 232–1245)

## 2023-10-26 LAB — INSULIN, RANDOM: INSULIN: 13.7 u[IU]/mL (ref 2.6–24.9)

## 2023-10-26 MED ORDER — METFORMIN HCL 500 MG PO TABS: 500.0000 mg | ORAL_TABLET | Freq: Every day | ORAL | 0 refills | Status: DC

## 2023-10-26 NOTE — Addendum Note (Signed)
 Addended by: WAYLAN DARICE BRAVO on: 10/26/2023 03:13 PM   Modules accepted: Orders

## 2023-11-17 ENCOUNTER — Ambulatory Visit: Admitting: Family Medicine

## 2023-11-17 ENCOUNTER — Encounter: Payer: Self-pay | Admitting: Family Medicine

## 2023-11-17 VITALS — BP 113/77 | HR 82 | Temp 98.7°F | Ht 64.0 in | Wt 209.0 lb

## 2023-11-17 DIAGNOSIS — N1831 Chronic kidney disease, stage 3a: Secondary | ICD-10-CM

## 2023-11-17 DIAGNOSIS — E88819 Insulin resistance, unspecified: Secondary | ICD-10-CM | POA: Diagnosis not present

## 2023-11-17 DIAGNOSIS — M1712 Unilateral primary osteoarthritis, left knee: Secondary | ICD-10-CM | POA: Diagnosis not present

## 2023-11-17 DIAGNOSIS — E66812 Obesity, class 2: Secondary | ICD-10-CM | POA: Diagnosis not present

## 2023-11-17 DIAGNOSIS — Z6835 Body mass index (BMI) 35.0-35.9, adult: Secondary | ICD-10-CM

## 2023-11-17 MED ORDER — METFORMIN HCL 500 MG PO TABS: 500.0000 mg | ORAL_TABLET | Freq: Every day | ORAL | 0 refills | Status: DC

## 2023-11-17 NOTE — Progress Notes (Signed)
 Office: [phone]  /  Fax: [phone]  WEIGHT SUMMARY AND BIOMETRICS  Starting Date: 08/03/22  Starting Weight: 321lb   Weight Lost Since Last Visit: 6lb   Vitals Temp: 98.7 F (37.1 C) BP: 113/77 Pulse Rate: 82 SpO2: 99 %   Body Composition  Body Fat %: 41.9 % Fat Mass (lbs): 87.6 lbs Muscle Mass (lbs): 115.2 lbs Total Body Water (lbs): 81.4 lbs Visceral Fat Rating : 12   HPI  Chief Complaint: OBESITY  Raven Montoya is here to discuss her progress with her obesity treatment plan. She is on the the Category 3 Plan and states she is following her eating plan approximately 20 % of the time. She states she is doing more walking.   Interval History:  Since last office visit she is down 6 lb She is down 1 lb of muscle mass and down 5.6 lb of body fat since last visit This gives her a net weight loss of 22 lb in 3 mos of medically supervised weight management (restarted our program) This is a 9.5% TBW loss She unexpectedly lost her fiance to pneumonia recently She struggled 1-2 weeks with lack of food/ water/ comfort foods (some) and lack of sleep She is walking more with some improvements in knee pain She hasn't scheduled ortho visit yet for knee pain She feels like she is getting back on track with diet and exercise, working to take better care of self Has a good support system  Pharmacotherapy: Metformin  500 mg daily for IR  PHYSICAL EXAM:  Blood pressure 113/77, pulse 82, temperature 98.7 F (37.1 C), height 5' 4 (1.626 m), weight 209 lb (94.8 kg), last menstrual period 12/30/2014, SpO2 99%. Body mass index is 35.87 kg/m.  General: She is overweight, cooperative, alert, well developed, and in no acute distress. PSYCH: Has normal mood, affect and thought process.   Lungs: Normal breathing effort, no conversational dyspnea.  ASSESSMENT AND PLAN  TREATMENT PLAN FOR OBESITY:  Recommended Dietary Goals  Raven Montoya is currently in the action stage of change. As  such, her goal is to continue weight management plan. She has agreed to the Category 3 Plan.  Behavioral Intervention  We discussed the following Behavioral Modification Strategies today: increasing lean protein intake to established goals, increasing fiber rich foods, work on meal planning and preparation, keeping healthy foods at home, decreasing eating out or consumption of processed foods, and making healthy choices when eating convenient foods, practice mindfulness eating and understand the difference between hunger signals and cravings, continue to practice mindfulness when eating, and display self-compassion.  Additional resources provided today: NA  Recommended Physical Activity Goals  Raven Montoya has been advised to work up to 150 minutes of moderate intensity aerobic activity a week and strengthening exercises 2-3 times per week for cardiovascular health, weight loss maintenance and preservation of muscle mass.   She has agreed to Increase the intensity, frequency or duration of aerobic exercises    Pharmacotherapy changes for the treatment of obesity: none  ASSOCIATED CONDITIONS ADDRESSED TODAY  Insulin  resistance Reviewed labs with patient from last visit Fasting insulin  has improved from 22.3--> 13.7 with lifestyle changes + use of metformin  500 mg daily, tolerating well Doing great with weight reduction Plan to ramp up walking time. -     metFORMIN  HCl; Take 1 tablet (500 mg total) by mouth daily with supper.  Dispense: 90 tablet; Refill: 0  Obesity, Class II, BMI 35-39.9 Improving Reviewed bioimpedence results with patient  BMI 35.0-35.9,adult  Primary osteoarthritis  of left knee Plans to meet with ortho to discuss L knee pain, slightly improved and able to walk 20-30 min without much pain.  Taking Meloxicam  every other day  Stage 3a chronic kidney disease [facility])   Chemistry      Component Value Date/Time   NA 140 10/25/2023 1046   NA 143 02/18/2016 1247   K 4.5  10/25/2023 1046   K 3.9 02/18/2016 1247   CL 99 10/25/2023 1046   CO2 22 10/25/2023 1046   CO2 27 02/18/2016 1247   BUN 19 10/25/2023 1046   BUN 18.8 02/18/2016 1247   CREATININE 1.13 (H) 10/25/2023 1046   CREATININE 0.8 02/18/2016 1247      Component Value Date/Time   CALCIUM 9.8 10/25/2023 1046   CALCIUM 9.5 02/18/2016 1247   ALKPHOS 86 08/03/2023 1031   ALKPHOS 109 02/18/2016 1247   AST 32 08/03/2023 1031   AST 31 02/18/2016 1247   ALT 25 08/03/2023 1031   ALT 45 02/18/2016 1247   BILITOT 0.6 08/03/2023 1031   BILITOT 0.80 02/18/2016 1247     Reviewed labs with patient.  GFR 55, stable This may be worsened by her RX NSAID use, moved to every other day Will monitor q 3 mos with use of metformin       She was informed of the importance of frequent follow up visits to maximize her success with intensive lifestyle modifications for her multiple health conditions.   ATTESTASTION STATEMENTS:  Reviewed by clinician on day of visit: allergies, medications, problem list, medical history, surgical history, family history, social history, and previous encounter notes pertinent to obesity diagnosis.   I have personally spent 30 minutes total time today in preparation, patient care, nutritional counseling and education,  and documentation for this visit, including the following: review of most recent clinical lab tests, prescribing medications/ refilling medications, reviewing medical assistant documentation, review and interpretation of bioimpedence results.     Raven Montoya, D.O. DABFM, DABOM [facility] [address] [place], [place] [address] [phone]

## 2023-12-19 ENCOUNTER — Ambulatory Visit: Admitting: Student

## 2023-12-26 ENCOUNTER — Ambulatory Visit: Admitting: Family Medicine

## 2023-12-26 ENCOUNTER — Encounter: Payer: Self-pay | Admitting: Family Medicine

## 2023-12-26 VITALS — BP 116/72 | HR 103 | Temp 98.7°F | Ht 64.0 in | Wt 207.0 lb

## 2023-12-26 DIAGNOSIS — F4320 Adjustment disorder, unspecified: Secondary | ICD-10-CM | POA: Diagnosis not present

## 2023-12-26 DIAGNOSIS — E88819 Insulin resistance, unspecified: Secondary | ICD-10-CM | POA: Diagnosis not present

## 2023-12-26 DIAGNOSIS — M1712 Unilateral primary osteoarthritis, left knee: Secondary | ICD-10-CM | POA: Diagnosis not present

## 2023-12-26 DIAGNOSIS — E66812 Obesity, class 2: Secondary | ICD-10-CM

## 2023-12-26 DIAGNOSIS — Z6835 Body mass index (BMI) 35.0-35.9, adult: Secondary | ICD-10-CM

## 2023-12-26 NOTE — Progress Notes (Signed)
 Office: [phone]  /  Fax: [phone]  WEIGHT SUMMARY AND BIOMETRICS  Starting Date: 08/03/22  Starting Weight: 321lb   Weight Lost Since Last Visit: 2lb   Vitals Temp: 98.7 F (37.1 C) BP: 116/72 Pulse Rate: (!) 103 SpO2: 99 %   Body Composition  Body Fat %: 44.7 % Fat Mass (lbs): 92.8 lbs Muscle Mass (lbs): 108.8 lbs Total Body Water (lbs): 78.6 lbs Visceral Fat Rating : 13     HPI  Chief Complaint: OBESITY  Raven Montoya is here to discuss her progress with her obesity treatment plan. She is on the the Category 3 Plan and states she is following her eating plan approximately 50 % of the time. She states she is exercising 0 minutes 0 times per week.   Interval History:  Since last office visit she is down 2 lb This gives her a net weight loss of 24 lb in 4 mos of medically supervised weight management This is a 10.7% TBW loss She has been battling depression since the passing of her fiance She has been eating out more but is mindful of food choices She has good control over sweets She has been doing some walking with continual L posterior knee pain (has been referred to [facility]) She did stop taking metformin  for IR Stress levels have been high but her friends and family have been supportive  Pharmacotherapy: none  PHYSICAL EXAM:  Blood pressure 116/72, pulse (!) 103, temperature 98.7 F (37.1 C), height 5' 4 (1.626 m), weight 207 lb (93.9 kg), last menstrual period 12/30/2014, SpO2 99%. Body mass index is 35.53 kg/m.  General: She is overweight, cooperative, alert, well developed, and in no acute distress. PSYCH: Has normal mood, affect and thought process.   Lungs: Normal breathing effort, no conversational dyspnea.   ASSESSMENT AND PLAN  TREATMENT PLAN FOR OBESITY:  Recommended Dietary Goals  [name] is currently in the action stage of change. As such, her goal is to continue weight management plan. She has agreed to cat 3 meal  plan  Behavioral Intervention  We discussed the following Behavioral Modification Strategies today: increasing lean protein intake to established goals, increasing fiber rich foods, increasing water intake , work on meal planning and preparation, keeping healthy foods at home, work on managing stress, creating time for self-care and relaxation, avoiding temptations and identifying enticing environmental cues, continue to practice mindfulness when eating, and planning for success.  Additional resources provided today: NA  Recommended Physical Activity Goals  [name] has been advised to work up to 150 minutes of moderate intensity aerobic activity a week and strengthening exercises 2-3 times per week for cardiovascular health, weight loss maintenance and preservation of muscle mass.   She has agreed to Start aerobic activity with a goal of 150 minutes a week at moderate intensity.  We discussed adding in gym/ outdoor walking  Pharmacotherapy changes for the treatment of obesity: d/c'd metformin   ASSOCIATED CONDITIONS ADDRESSED TODAY  Primary osteoarthritis of left knee Continues to have L posterior knee pain without trauma, redness or swelling Able to bear weight and walk w/o much pain Has been referred to [facility] by PCP, awaiting call back  Insulin  resistance Improved.  Fasting insulin  improved from 22-> 13  She chose to stop metformin  (didn't want to take) Continue to work on diet and exercise change alone Plan for A1c and fasting insulin  in 2-3 mos  Obesity, Class II, BMI 35-39.9 Improving  BMI 35.0-35.9,adult  Grief reaction Stable She declined need  for referral She has a great support system of friends and family following the loss of her fiance Continue to work on improving sleep, nutrition, self care and movement     She was informed of the importance of frequent follow up visits to maximize her success with intensive lifestyle modifications for her multiple  health conditions.   ATTESTASTION STATEMENTS:  Reviewed by clinician on day of visit: allergies, medications, problem list, medical history, surgical history, family history, social history, and previous encounter notes pertinent to obesity diagnosis.   I have personally spent 30 minutes total time today in preparation, patient care, nutritional counseling and education,  and documentation for this visit, including the following: review of most recent clinical lab tests, prescribing medications/ refilling medications, reviewing medical assistant documentation, review and interpretation of bioimpedence results.     Darice Haddock, D.O. DABFM, DABOM [facility] [address] [place], [place] [address] [phone]

## 2024-01-03 ENCOUNTER — Other Ambulatory Visit (HOSPITAL_COMMUNITY)
Admission: RE | Admit: 2024-01-03 | Discharge: 2024-01-03 | Disposition: A | Source: Ambulatory Visit | Attending: Obstetrics and Gynecology | Admitting: Obstetrics and Gynecology

## 2024-01-03 ENCOUNTER — Encounter: Payer: Self-pay | Admitting: Obstetrics and Gynecology

## 2024-01-03 ENCOUNTER — Ambulatory Visit: Admitting: Obstetrics and Gynecology

## 2024-01-03 VITALS — BP 112/75 | HR 94 | Wt 210.6 lb

## 2024-01-03 DIAGNOSIS — Z113 Encounter for screening for infections with a predominantly sexual mode of transmission: Secondary | ICD-10-CM | POA: Insufficient documentation

## 2024-01-03 DIAGNOSIS — Z1151 Encounter for screening for human papillomavirus (HPV): Secondary | ICD-10-CM | POA: Insufficient documentation

## 2024-01-03 DIAGNOSIS — Z01419 Encounter for gynecological examination (general) (routine) without abnormal findings: Secondary | ICD-10-CM | POA: Diagnosis present

## 2024-01-03 DIAGNOSIS — Z124 Encounter for screening for malignant neoplasm of cervix: Secondary | ICD-10-CM | POA: Diagnosis present

## 2024-01-03 NOTE — Progress Notes (Unsigned)
 ANNUAL EXAM Patient name: Raven Montoya MRN [id number]  Date of birth: 1960-06-13 Chief Complaint:   Gynecologic Exam  History of Present Illness:   Raven Montoya is a 63 y.o. G42P1001  female being seen today for a routine annual exam.  Current complaints: using a cream for dryness that was prescribed by urology   Patient's last menstrual period was 12/30/2014.   The pregnancy intention screening data noted above was reviewed. Potential methods of contraception were discussed. The patient elected to proceed with No data recorded.   Gynecologic History Patient's last menstrual period was . Contraception:  Last Pap:07/18/2018 . Results were: Normal Last mammogram:[facility]      01/03/2024    1:39 PM 11/12/2021    8:30 AM 08/16/2016   11:56 AM 05/19/2016    1:09 PM  Depression screen PHQ 2/9  Decreased Interest 0 1 0 0  Down, Depressed, Hopeless 0 2 0 0  PHQ - 2 Score 0 3 0 0  Altered sleeping 1 2    Tired, decreased energy 0 3    Change in appetite 0 2    Feeling bad or failure about yourself  0 1    Trouble concentrating 0 0    Moving slowly or fidgety/restless 0 0    Suicidal thoughts 0 1    PHQ-9 Score 1 12     Difficult doing work/chores  Somewhat difficult       Data saved with a previous flowsheet row definition        01/03/2024    1:39 PM  GAD 7 : Generalized Anxiety Score  Nervous, Anxious, on Edge 1  Control/stop worrying 1  Worry too much - different things 1  Trouble relaxing 0  Restless 0  Easily annoyed or irritable 0  Afraid - awful might happen 0  Total GAD 7 Score 3     Review of Systems:   Pertinent items are noted in HPI Denies any headaches, blurred vision, fatigue, shortness of breath, chest pain, abdominal pain, abnormal vaginal discharge/itching/odor/irritation, problems with periods, bowel movements, urination, or intercourse unless otherwise stated above. Pertinent History Reviewed:  Reviewed past medical,surgical, social and  family history.  Reviewed problem list, medications and allergies. Physical Assessment:   Vitals:   01/03/24 1337  BP: 112/75  Pulse: 94  Weight: 210 lb 9.6 oz (95.5 kg)  Body mass index is 36.15 kg/m.        Physical Examination:   General appearance - well appearing, and in no distress  Mental status - alert, oriented   Psych:  She has a normal mood and affect  Skin - warm and dry, normal color  Chest - effort normal, all lung fields clear to auscultation bilaterally  Heart - normal rate and regular rhythm  Neck:  midline trachea, no thyromegaly or nodules  Breasts - breasts appear normal, no suspicious masses, no skin or nipple changes or  axillary nodes  Abdomen - soft, nontender, nondistended  Pelvic - VULVA: normal appearing vulva with no masses, tenderness or lesions  VAGINA: normal appearing vagina with normal color and discharge, no lesions  CERVIX: normal appearing cervix without discharge or lesions, no CMT  Thin prep pap is done w HR HPV cotesting  UTERUS: uterus is felt to be normal size, shape, consistency and nontender   ADNEXA: No adnexal masses or tenderness noted.  Extremities:  No swelling or varicosities noted  Chaperone present for exam  No results found for this or  any previous visit (from the past 24 hours).  Assessment & Plan:  1) Well-Woman Exam  2) ***  Labs/procedures today: ***  Mammogram: {Mammo f/u:25212::@ 63yo}, or sooner if problems Colonoscopy: {TCS f/u:25213::@ 63yo}, or sooner if problems  No orders of the defined types were placed in this encounter.   Meds: No orders of the defined types were placed in this encounter.   Follow-up: No follow-ups on file.  Nidia Daring, FNP 01/03/2024 1:50 PM

## 2024-01-04 ENCOUNTER — Other Ambulatory Visit: Payer: Self-pay | Admitting: Neurology

## 2024-01-04 DIAGNOSIS — B009 Herpesviral infection, unspecified: Secondary | ICD-10-CM

## 2024-01-04 MED ORDER — VALACYCLOVIR HCL 1 G PO TABS: 1000.0000 mg | ORAL_TABLET | Freq: Two times a day (BID) | ORAL | 3 refills | Status: AC

## 2024-01-06 LAB — CYTOLOGY - PAP
Chlamydia: NEGATIVE
Comment: NEGATIVE
Comment: NEGATIVE
Comment: NORMAL
Diagnosis: NEGATIVE
High risk HPV: NEGATIVE
Neisseria Gonorrhea: NEGATIVE

## 2024-01-09 ENCOUNTER — Ambulatory Visit: Payer: Self-pay | Admitting: Obstetrics and Gynecology

## 2024-01-24 ENCOUNTER — Ambulatory Visit: Admitting: Student

## 2024-01-26 ENCOUNTER — Ambulatory Visit: Admitting: Student

## 2024-02-13 ENCOUNTER — Ambulatory Visit (INDEPENDENT_AMBULATORY_CARE_PROVIDER_SITE_OTHER): Admitting: Neurology

## 2024-02-13 ENCOUNTER — Encounter: Payer: Self-pay | Admitting: Neurology

## 2024-02-13 VITALS — BP 111/68 | HR 96 | Resp 15 | Ht 64.0 in | Wt 211.5 lb

## 2024-02-13 DIAGNOSIS — R3915 Urgency of urination: Secondary | ICD-10-CM

## 2024-02-13 DIAGNOSIS — G35A Relapsing-remitting multiple sclerosis: Secondary | ICD-10-CM

## 2024-02-13 DIAGNOSIS — G35D Multiple sclerosis, unspecified: Secondary | ICD-10-CM | POA: Diagnosis not present

## 2024-02-13 DIAGNOSIS — R5383 Other fatigue: Secondary | ICD-10-CM | POA: Diagnosis not present

## 2024-02-13 DIAGNOSIS — F988 Other specified behavioral and emotional disorders with onset usually occurring in childhood and adolescence: Secondary | ICD-10-CM | POA: Diagnosis not present

## 2024-02-13 DIAGNOSIS — M488X9 Other specified spondylopathies, site unspecified: Secondary | ICD-10-CM

## 2024-02-13 DIAGNOSIS — E559 Vitamin D deficiency, unspecified: Secondary | ICD-10-CM

## 2024-02-13 DIAGNOSIS — Z79899 Other long term (current) drug therapy: Secondary | ICD-10-CM

## 2024-02-13 DIAGNOSIS — R269 Unspecified abnormalities of gait and mobility: Secondary | ICD-10-CM

## 2024-02-13 MED ORDER — AMPHETAMINE-DEXTROAMPHETAMINE 15 MG PO TABS: 30.0000 mg | ORAL_TABLET | Freq: Every day | ORAL | 0 refills | Status: AC

## 2024-02-13 NOTE — Progress Notes (Signed)
 "  [facility]  PATIENT: Raven Montoya DOB: [birth year]  REFERRING CLINICIAN: Ozell Reilly HISTORY FROM: Patient  REASON FOR VISIT: MS    HISTORICAL  CHIEF COMPLAINT:  Chief Complaint  Patient presents with   Multiple Sclerosis    Rm10, alone, Ms follow up    HISTORY OF PRESENT ILLNESS:  Raven Montoya is a 64 y.o. woman with relapsing remitting MS.      Update  02/13/2023: She is on Gilenya  and tolerates it well. Due to low lymphocytes, she takes the Gilenya  qod.   Last 11/24 lymphocytes were 0.3.   Last brain and cervical spine MRI on 12/16/2020 showed no changes compared to the 01/13/18 MRI.      She is  neurologically stable no recent exacerbations.   Anxiety is better.    Gait is stable and mildly off balanced.   She can walk a mile and usually keeps up with others.  She needs to use a bannister on stairs.  She has mild left leg mild weakness and spasticity associated with pain.    In that leg, spasms and cramps occur at night when she has been more active but not most nights.    She has transient mild numbness in hands and feet but no persistent numbness,    Bladder function does well on solifenacin .  She has a history of kidney stones.     Vision is stable and she has recent examination.    She has fatigue and MS related ADD.  Also mild cognitive fog.  Adderall has helped her attention deficit and fatigue.   Recently she is sleeping better with 1/2 Ambien nightly.  She has some depression.    She is on duloxetine  60 mg  and bupropion with some benefit   Her fiance died December 16, 2024 of CHF (long hx) and arrhythmia.    She feels grief but feels better each day.    She has soe neck pain and reduced ROM and stiffness.    Meloxicam  was stopped due to elevated creatinine.      Steroid TPIs have helped her neck pain in the past for a couple months at a time.  She notes the ROM is reduced especially looking to the left.   She also has lumbar pain and reports one level  jutting out (spondylolisthesis?)  Vit D had been low but was elevated to 107 on 50000 U weekly so not supplementing now.    MS History:   She was diagnosed with multiple sclerosis at age 56 after presenting with optic neuritis in the left eye. Her neurologist discussed with her that she probably had MS but no medication was started at that time. Vision got better over the next few weeks. A couple years later, she went to see Dr. Vicenta Ned. He felt that the MS should be treated and she was started on Rebif. She remain on Rebif for many years   She had not had any major exacerbations while on Rebif but had needles fatigue.    In 2014, she switched to Gilenya . She tolerates Gilenya  well.   Imaging: MRI of the brain 01/11/2018 shows T2/FLAIR hyperintense foci in the periventricular, juxtacortical and deep white matter.  There were no new lesions.  No change compared to 01/25/2017.  MRI of the cervical spine 01/11/2018 showed a subtle focus to the left at C4.SABRA  She had multilevel degenerative changes with a disc protrusion at C2-C3 towards the left, disc protrusion, posterior longitudinal ligament hypertrophy  and facet hypertrophy at C3-C4 and C4-C5.  There is mild spinal stenosis at those levels.  MRI of the brain 01/25/2017 showed Multiple T2/FLAIR hyperintense foci in the periventricular, juxtacortical and deep white matter of both hemispheres in a pattern and configuration consistent with chronic demyelinating plaque associated with multiple sclerosis. None of the foci appears to be acute and there is no change when compared to the 02/12/2015 MRI.  MRI brain 12/16/2020 was unchanged.  MRI brain 01/20/2023 was unchanged.  MRI cervical spine 01/20/2023 showed: IMPRESSION: This MRI of the cervical spine without contrast shows the following: Two small T2 hyperintense foci within the spinal cord adjacent to C4 and C4-C5 and a couple small T2 hyperintense foci within the cerebellum consistent with  chronic demyelinating plaque associated with multiple sclerosis.  None of the foci appears to be acute and they were present on the MRI from 2022. Multilevel degenerative changes as detailed above causes mild spinal stenosis at C3-C4, C4-C5 and C5-C6.  There are various degrees of foraminal narrowing but no nerve root compression.  Degenerative changes at these levels are stable compared to the 2022 MRI. Mild progression of the anterolisthesis seen at T1-T2.  There is no spinal stenosis or nerve root compression at this level.   REVIEW OF SYSTEMS:  Constitutional: No fevers, chills, sweats, or change in appetite.   She has fatigue and mild insomnia (helped by Ambien) Eyes: No visual changes, double vision, eye pain Ear, nose and throat: No hearing loss, ear pain, nasal congestion, sore throat Cardiovascular: No chest pain, palpitations Respiratory:  No shortness of breath at rest or with exertion.   No wheezes GastrointestinaI: No nausea, vomiting, diarrhea, abdominal pain, fecal incontinence Genitourinary:  No dysuria, urinary retention or frequency.  No nocturia. Musculoskeletal: Neck and back pain as above Integumentary: No rash, pruritus, skin lesions Neurological: as above Psychiatric: as above.    Endocrine: No palpitations, diaphoresis, change in appetite, change in weigh or increased thirst Hematologic/Lymphatic:  No anemia, purpura, petechiae. Allergic/Immunologic: No itchy/runny eyes, nasal congestion, recent allergic reactions, rashes  ALLERGIES: No Known Allergies   HOME MEDICATIONS: Outpatient Medications Prior to Visit  Medication Sig Dispense Refill   amphetamine -dextroamphetamine  (ADDERALL) 15 MG tablet Take 2 tablets by mouth daily. 60 tablet 0   busPIRone  (BUSPAR ) 15 MG tablet Take 1 tablet (15 mg total) by mouth 2 (two) times daily. 180 tablet 3   CALCIUM CARBONATE PO Take 250 mg by mouth daily.     cyclobenzaprine  (FLEXERIL ) 10 MG tablet Take 1 tablet (10 mg total)  by mouth at bedtime as needed. 90 tablet 3   DULoxetine  (CYMBALTA ) 60 MG capsule TAKE 1 CAPSULE EVERY DAY 90 capsule 3   Fingolimod  HCl (GILENYA ) 0.5 MG CAPS Take 1 capsule (0.5 mg total) by mouth every other day. MAY TAKE WITH OR WITHOUT FOOD. STORE AT ROOM TEMPERATURE. 45 capsule 3   HYDROcodone -acetaminophen  (NORCO/VICODIN) 5-325 MG tablet Take 1-2 tablets by mouth every 6 (six) hours as needed.     IRON PO Take 1 tablet by mouth 2 (two) times a week. occ     meloxicam  (MOBIC ) 7.5 MG tablet TAKE 1 TABLET BY MOUTH EVERY DAY 30 tablet 0   valACYclovir  (VALTREX ) 1000 MG tablet Take 1 tablet (1,000 mg total) by mouth 2 (two) times daily. Take for five to ten days. 20 tablet 3   zolpidem (AMBIEN) 10 MG tablet 5 mg at bedtime.     Facility-Administered Medications Prior to Visit  Medication Dose Route Frequency  Provider Last Rate Last Admin   gadopentetate dimeglumine  (MAGNEVIST ) injection 20 mL  20 mL Intravenous Once PRN Posey Petrik, Charlie LABOR, MD       gadopentetate dimeglumine  (MAGNEVIST ) injection 20 mL  20 mL Intravenous Once PRN Jaleiyah Alas, Charlie LABOR, MD        PAST MEDICAL HISTORY: Past Medical History:  Diagnosis Date   Anemia    Anxiety    Arthritis    oa   Back pain    Bilateral swelling of feet    Cancer (HCC) 2018   right breast   Depression    Depression    Fatty liver    Fractured rib hx of 2018   healed   History of hiatal hernia    small per ct per pt   History of kidney stones yrs ago   passed on own   History of radiation therapy 06/02/16-07/14/16   right breast 50.4 Gy in 28 fractions   Joint pain    Memory loss    occ   Multiple sclerosis    age 26   Shingles 2016   back   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   Urinary urgency    Wears glasses    Wears glasses     PAST SURGICAL HISTORY: Past Surgical History:  Procedure Laterality Date   ADENOIDECTOMY     BREAST LUMPECTOMY WITH RADIOACTIVE SEED AND SENTINEL LYMPH NODE BIOPSY Right 03/25/2016    Procedure: RIGHT BREAST SEED GUIDED PARTIAL MASTECTOMY WITH RIGHT SENITINEL LYMPH NODE MAPPING;  Surgeon: Debby Shipper, MD;  Location: [facility];  Service: General;  Laterality: Right;   BREAST RECONSTRUCTION Right 03/25/2016   Procedure: RIGHT BREAST RECONSTRUCTION;  Surgeon: Estefana RAMAN Dillingham, DO;  Location: [facility] OR;  Service: Plastics;  Laterality: Right;   BREAST REDUCTION SURGERY Right 03/25/2016   Procedure: MAMMARY REDUCTION  (BREAST) RIGHT;  Surgeon: Estefana RAMAN Dillingham, DO;  Location: [facility] OR;  Service: Plastics;  Laterality: Right;   BREAST REDUCTION SURGERY Left 01/06/2017   Procedure: LEFT MAMMARY REDUCTION  (BREAST) FOR SYMMETRY WITH LIPOSUCTION;  Surgeon: Lowery Estefana RAMAN, DO;  Location: [facility];  Service: Plastics;  Laterality: Left;   BREAST SURGERY Left 01/25/2018   breast reduction   BUNIONECTOMY Bilateral 2011   pins in both big toes   CESAREAN SECTION     x 1   COLONOSCOPY W/ BIOPSIES AND POLYPECTOMY  2018 and 2021   f/u 5 yrs now   CYSTOSCOPY WITH RETROGRADE PYELOGRAM, URETEROSCOPY AND STENT PLACEMENT Right 05/13/2020   Procedure: CYSTOSCOPY WITH RETROGRADE PYELOGRAM, URETEROSCOPY AND STENT PLACEMENT;  Surgeon: Elisabeth Valli BIRCH, MD;  Location: [facility];  Service: Urology;  Laterality: Right;  1 HR   HOLMIUM LASER APPLICATION Right 05/13/2020   Procedure: HOLMIUM LASER APPLICATION;  Surgeon: Elisabeth Valli BIRCH, MD;  Location: [facility];  Service: Urology;  Laterality: Right;   INCISION AND DRAINAGE OF WOUND Right 04/21/2016   Procedure: IRRIGATION AND DEBRIDEMENT RIGHT BREAST WOUND;  Surgeon: Estefana RAMAN Lowery, DO;  Location: [facility];  Service: Plastics;  Laterality: Right;   MASS EXCISION Left 02/24/2017   Procedure: LEFT BREAST WOUND EXCISION AND CLOSURE WITH ACELL APPLICATION;  Surgeon: Lowery Estefana RAMAN, DO;  Location: [facility];  Service: Plastics;  Laterality: Left;   TONSILLECTOMY   age 73   and adenoids    FAMILY HISTORY: Family History  Problem Relation Age of Onset  Hyperlipidemia Mother    Osteoporosis Mother    Heart disease Mother    Stroke Mother    COPD Mother    Cancer Father        colon   Heart disease Brother    Heart attack Brother     SOCIAL HISTORY:  Social History   Socioeconomic History   Marital status: Divorced    Spouse name: Not on file   Number of children: 1   Years of education: Not on file   Highest education level: Not on file  Occupational History   Occupation: Midwife   Occupation: Retired  Tobacco Use   Smoking status: Never   Smokeless tobacco: Never  Vaping Use   Vaping status: Never Used  Substance and Sexual Activity   Alcohol use: Not Currently    Comment: rare   Drug use: Yes   Sexual activity: Not Currently    Partners: Male    Birth control/protection: Condom    Comment: partner has had vasectomy  Other Topics Concern   Not on file  Social History Narrative   Not on file   Social Drivers of Health   Tobacco Use: Low Risk (02/13/2024)   Patient History    Smoking Tobacco Use: Never    Smokeless Tobacco Use: Never    Passive Exposure: Not on file  Financial Resource Strain: Not on file  Food Insecurity: Not on file  Transportation Needs: Not on file  Physical Activity: Not on file  Stress: Not on file  Social Connections: Unknown (06/21/2021)   Received from [facility]   Social Network    Social Network: Not on file  Intimate Partner Violence: Unknown (05/14/2021)   Received from [facility]   HITS    Physically Hurt: Not on file    Insult or Talk Down To: Not on file    Threaten Physical Harm: Not on file    Scream or Curse: Not on file  Depression (PHQ2-9): Low Risk (01/03/2024)   Depression (PHQ2-9)    PHQ-2 Score: 1  Alcohol Screen: Not on file  Housing: Not on file  Utilities: Not on file  Health Literacy: Not on file     PHYSICAL EXAM  Vitals:   02/13/24 1002  BP:  111/68  Pulse: 96  Resp: 15  Weight: 211 lb 8 oz (95.9 kg)  Height: 5' 4 (1.626 m)      Body mass index is 36.3 kg/m.   General: She is a well-developed and well-nourished and in no acute distress  Musculoskeletal:   She has moderate lower cervical tenderness on the left and also in the left multifidus and trapezius muscle.  Mild reduced range of motion of the neck., worse looking to the left.     Neurologic Exam  Mental status: The patient is alert and oriented x 3 at the time of the examination. The patient has apparent normal recent and remote memory, with an apparently normal attention span and concentration ability.   Speech is normal.  Cranial nerves: Extraocular movements are full.  Facial strength and sensation is normal.  Trapezius strength is normal.  Hearing was normal.  Motor:  Muscle bulk and tone are normal. Strength is  5 / 5 in both arms and right leg.  Strength is 4/5 left iliopsoas and toe extension.   .   Sensory: Intact sensation to touch and vibration in the arms and legs.  Coordination: Finger-nose-finger and heel-to-shin was performed well bilaterally.  Gait and  station: Station is normal.  Mild left foot drop.  Tandem is wide. .  Romberg negative,   Reflexes: Deep tendon reflexes are symmetric and brisk bilaterally but no ankle clonus.    Multiple sclerosis, relapsing-remitting - Plan: CBC with Differential/Platelet  High risk medication use - Plan: CBC with Differential/Platelet  Other fatigue  Gait disturbance  Vitamin D  deficiency  Posterior longitudinal ligament ossification (HCC)  Attention deficit disorder, unspecified type  Urinary urgency   1.   Her MS has been stable on Gilenya   on qod dosing (due to very low lymphocyted in past).  Last lymphocytes were 0/3 (07/21/23).   Will check lymphocyte count.  If lymphocyte too low on every other day dosing, consider change to [facility]. 2.   Continue Cymbalta  60 mg  Continue Adderall. 3.    Stay active and exercise as tolerated. 4.    Will check vitamin D  to determine dose of supplementation.   Return in 6 months or sooner if there are new or worsening neurologic symptoms.   This visit is part of a comprehensive longitudinal care medical relationship regarding the patients primary diagnosis of multiple sclerosis and related concerns.   Asra Gambrel A. Vear, MD, PhD 02/13/2024, 10:35 AM Certified in Neurology, Clinical Neurophysiology, Sleep Medicine, Pain Medicine and Neuroimaging  [facility] [address] [place], [place] [address] [phone]  "

## 2024-02-14 ENCOUNTER — Ambulatory Visit: Admitting: Family Medicine

## 2024-02-14 ENCOUNTER — Ambulatory Visit: Payer: Self-pay | Admitting: Neurology

## 2024-02-14 ENCOUNTER — Encounter: Payer: Self-pay | Admitting: Family Medicine

## 2024-02-14 VITALS — BP 101/74 | HR 110 | Ht 64.0 in | Wt 203.0 lb

## 2024-02-14 DIAGNOSIS — E66811 Obesity, class 1: Secondary | ICD-10-CM | POA: Diagnosis not present

## 2024-02-14 DIAGNOSIS — E88819 Insulin resistance, unspecified: Secondary | ICD-10-CM | POA: Diagnosis not present

## 2024-02-14 DIAGNOSIS — M1712 Unilateral primary osteoarthritis, left knee: Secondary | ICD-10-CM | POA: Diagnosis not present

## 2024-02-14 DIAGNOSIS — F4320 Adjustment disorder, unspecified: Secondary | ICD-10-CM

## 2024-02-14 DIAGNOSIS — E6609 Other obesity due to excess calories: Secondary | ICD-10-CM

## 2024-02-14 DIAGNOSIS — Z6834 Body mass index (BMI) 34.0-34.9, adult: Secondary | ICD-10-CM | POA: Diagnosis not present

## 2024-02-14 LAB — CBC WITH DIFFERENTIAL/PLATELET
Basophils Absolute: 0 x10E3/uL (ref 0.0–0.2)
Basos: 0 %
EOS (ABSOLUTE): 0.3 x10E3/uL (ref 0.0–0.4)
Eos: 5 %
Hematocrit: 43.4 % (ref 34.0–46.6)
Hemoglobin: 14.2 g/dL (ref 11.1–15.9)
Immature Grans (Abs): 0 x10E3/uL (ref 0.0–0.1)
Immature Granulocytes: 0 %
Lymphocytes Absolute: 0.4 x10E3/uL — ABNORMAL LOW (ref 0.7–3.1)
Lymphs: 8 %
MCH: 32.9 pg (ref 26.6–33.0)
MCHC: 32.7 g/dL (ref 31.5–35.7)
MCV: 101 fL — ABNORMAL HIGH (ref 79–97)
Monocytes Absolute: 0.4 x10E3/uL (ref 0.1–0.9)
Monocytes: 7 %
Neutrophils Absolute: 4.2 x10E3/uL (ref 1.4–7.0)
Neutrophils: 79 %
Platelets: 250 x10E3/uL (ref 150–450)
RBC: 4.32 x10E6/uL (ref 3.77–5.28)
RDW: 15.5 % — ABNORMAL HIGH (ref 11.7–15.4)
WBC: 5.4 x10E3/uL (ref 3.4–10.8)

## 2024-02-14 NOTE — Progress Notes (Signed)
 "  Office: [phone]  /  Fax: [phone]  WEIGHT SUMMARY AND BIOMETRICS  Starting Date: 08/03/22  Starting Weight: 321lb   Weight Lost Since Last Visit: 4lb   Vitals BP: 101/74 Pulse Rate: (!) 110 SpO2: 97 %   Body Composition  Body Fat %: 43.6 % Fat Mass (lbs): 88.6 lbs Muscle Mass (lbs): 108.6 lbs Total Body Water (lbs): 75.6 lbs Visceral Fat Rating : 13     HPI  Chief Complaint: OBESITY  Raven Montoya Montoya here to discuss Raven Montoya progress with Raven Montoya obesity treatment plan. Raven Montoya on the the Category 3 Plan and states Raven Montoya following Raven Montoya eating plan approximately 40-50 % of the time. Raven Montoya states Raven Montoya exercising 30 minutes 5-7 times per week.   Interval History:  Since last office visit Raven Montoya down 4 lb Raven Montoya at home since the passing of Raven Montoya this Fall Raven Montoya 1 mile without much L knee pain Raven Montoya a net weight loss of 28 lb in 5 mos This Montoya a 12.5% TBW loss Raven Montoya hunger or cravings Raven Montoya    Pharmacotherapy: none  PHYSICAL EXAM:  Blood pressure 101/74, pulse (!) 110, height 5' 4 (1.626 m), weight 203 lb (92.1 kg), last menstrual period 12/30/2014, SpO2 97%. Body mass index Montoya 34.84 kg/m.  General: Raven Montoya overweight, cooperative, alert, well developed, and in no acute distress. PSYCH: Montoya normal mood, affect and thought process.   Lungs: Normal breathing effort, no conversational dyspnea.   ASSESSMENT AND PLAN  TREATMENT PLAN FOR OBESITY:  Recommended Dietary Goals  Raven Montoya Montoya currently in the action stage of change. As such, Raven Montoya goal Montoya to continue weight management plan. Raven Montoya to the Category 3 Plan.  Behavioral Intervention  We discussed the following Behavioral Modification Strategies today: increasing lean protein intake to established goals, increasing fiber rich foods, work on meal planning and preparation, keeping healthy foods at home, continue to practice  mindfulness when eating, and planning for success.  Additional resources provided today: NA  Recommended Physical Activity Goals  Raven Montoya to work up to 150 minutes of moderate intensity aerobic activity a week and strengthening exercises 2-3 times per week for cardiovascular health, weight loss maintenance and preservation of muscle mass.   Raven Montoya strengthening exercises with a goal of 2-3 sessions a week  and Increase the intensity, frequency or duration of aerobic exercises    Pharmacotherapy changes for the treatment of obesity: none  ASSOCIATED CONDITIONS ADDRESSED TODAY  Insulin  resistance Improving Continue active plan for weight loss + healthy lifestyle changes Add in resistance training 2 days/ wk  Class 1 obesity due to excess calories with serious comorbidity and body mass index (BMI) of 34.0 to 34.9 in adult Improving Reviewed body composition changes with patient Stay off AOMs  Primary osteoarthritis of left knee Stable L knee pain comes and goes but Montoya not limited ability to do some Montoya We discussed yoga, weight training, chair exercise and water exercise as options  Grief reaction Improving Raven Montoya the support of friends and family following the loss of Raven Montoya Holidays were tough but Raven Montoya taking good care of herself and Montoya been consistent with lifestyle changes     Raven Montoya was informed of the importance of frequent follow up visits to maximize Raven Montoya success with intensive lifestyle modifications for Raven Montoya multiple health conditions.   ATTESTASTION STATEMENTS:  Reviewed by clinician on day of visit: allergies, medications, problem list, medical history, surgical history, family history, social history, and previous encounter notes pertinent to obesity diagnosis.      Raven Montoya, D.O. DABFM, DABOM [facility] [address] [place], [place] [address] [phone] "

## 2024-02-20 ENCOUNTER — Encounter: Payer: Self-pay | Admitting: *Deleted

## 2024-03-20 ENCOUNTER — Ambulatory Visit: Admitting: Family Medicine

## 2024-10-16 ENCOUNTER — Ambulatory Visit: Admitting: Neurology
